# Patient Record
Sex: Male | Born: 1980
Health system: Southern US, Community
[De-identification: ages and names within clinical notes are randomized; demographics above are authoritative.]

## PROBLEM LIST (undated history)

## (undated) DIAGNOSIS — E785 Hyperlipidemia, unspecified: Secondary | ICD-10-CM

## (undated) DIAGNOSIS — J309 Allergic rhinitis, unspecified: Secondary | ICD-10-CM

## (undated) DIAGNOSIS — J45909 Unspecified asthma, uncomplicated: Secondary | ICD-10-CM

## (undated) HISTORY — DX: Allergic rhinitis, unspecified: J30.9

## (undated) HISTORY — DX: Hyperlipidemia, unspecified: E78.5

## (undated) HISTORY — DX: Unspecified asthma, uncomplicated: J45.909

## (undated) HISTORY — PX: HIP SURGERY: SHX245

---

## 2009-09-17 ENCOUNTER — Ambulatory Visit: Payer: Self-pay | Admitting: Internal Medicine

## 2009-09-17 DIAGNOSIS — J45909 Unspecified asthma, uncomplicated: Secondary | ICD-10-CM

## 2009-09-17 DIAGNOSIS — J309 Allergic rhinitis, unspecified: Secondary | ICD-10-CM | POA: Insufficient documentation

## 2009-09-17 DIAGNOSIS — E785 Hyperlipidemia, unspecified: Secondary | ICD-10-CM

## 2009-09-17 DIAGNOSIS — L03119 Cellulitis of unspecified part of limb: Secondary | ICD-10-CM

## 2009-09-17 DIAGNOSIS — L02519 Cutaneous abscess of unspecified hand: Secondary | ICD-10-CM | POA: Insufficient documentation

## 2009-09-17 HISTORY — DX: Allergic rhinitis, unspecified: J30.9

## 2009-09-17 HISTORY — DX: Unspecified asthma, uncomplicated: J45.909

## 2009-09-17 HISTORY — DX: Hyperlipidemia, unspecified: E78.5

## 2010-01-21 ENCOUNTER — Telehealth: Payer: Self-pay | Admitting: Internal Medicine

## 2010-01-21 ENCOUNTER — Ambulatory Visit
Admission: RE | Admit: 2010-01-21 | Discharge: 2010-01-21 | Payer: Self-pay | Source: Home / Self Care | Attending: Internal Medicine | Admitting: Internal Medicine

## 2010-01-21 DIAGNOSIS — J069 Acute upper respiratory infection, unspecified: Secondary | ICD-10-CM | POA: Insufficient documentation

## 2010-02-05 NOTE — Assessment & Plan Note (Signed)
Summary: ?BITTEN BY SOMETHING UNABLE TO MAKE A FIST/NJR   Vital Signs:  Patient profile:   30 year old male Weight:      194 pounds Temp:     99.0 degrees F oral BP sitting:   116 / 80  (left arm) Cuff size:   regular  Vitals Entered By: Duard Brady LPN (September 17, 2009 28:15 PM) CC: new pt - (R) hand swelling - noted saturday night  Is Patient Diabetic? No   CC:  new pt - (R) hand swelling - noted saturday night .  History of Present Illness:  30 year old patient who presents with a 3-day history of increasing pain, swelling, excessive warmth, and minimal erythema involving the dorsum of the right hand, especially over the MCP joints.  This began 3 days ago, while vacationing at [place] [employer].  There's been no trauma.  He has noted a small papule just proximal to the third MCP joint.  No fever, chills, or other constitutional complaints.  He does have a history of allergic rhinitis and has been on chronic antihistamines.  Preventive Screening-Counseling & Management  Alcohol-Tobacco     Smoking Status: never  Allergies (verified): No Known Drug Allergies  Past History:  Past Medical History: Kawasaki's disease  Hyperlipidemia Allergic rhinitis Asthma  Past Surgical History: circumcision 1986 2001.  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 positive for hypercholesterolemia, cardiac disease, and hypertension  Social History: Reviewed history and no changes required. 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    Physical Exam  General:  Well-developed,well-nourished,in no acute distress; alert,appropriate and cooperative throughout examination Skin:  the dorsum of the right and was slightly tender edematous and warm to touch with slight erythema.  There is a small 1 cm papule just  proximal to the right third MCP joint   Impression & Recommendations:  Problem # 1:  CELLULITIS, HAND, RIGHT (ICD-682.4)  His updated medication list for this problem includes:    Cephalexin 500 Mg Caps (Cephalexin) ..... One capsule 3 times daily  Complete Medication List: 1)  Weekly Allergy Injections  2)  Flonase 50 Mcg/act Susp (Fluticasone propionate) .... Qd 3)  Allegra Allergy 180 Mg Tabs (Fexofenadine hcl) .... Qd 4)  Cephalexin 500 Mg Caps (Cephalexin) .... One capsule 3 times daily  Patient Instructions: 1)  Please schedule a follow-up appointment as needed. 2)  Take your antibiotic as prescribed until ALL of it is gone, but stop if you develop a rash or swelling and contact our office as soon as possible. 3)  VIMOVO one twice daily Prescriptions: CEPHALEXIN 500 MG CAPS (CEPHALEXIN) one capsule 3 times daily  #21 x 0   Entered and Authorized by:   Gordy Savers  MD   Signed by:   Gordy Savers  MD on 09/17/2009   Method used:   Electronically to        [facility] [phone]* (retail)       [address]       [place], [place]  [address]       Ph: [phone] or [phone]       Fax: [phone]   RxID:   [id number]

## 2010-02-07 NOTE — Assessment & Plan Note (Signed)
Summary: congestion/cough/njr   Vital Signs:  Patient profile:   30 year old male Weight:      197 pounds Temp:     99.5 degrees F oral BP sitting:   118 / 80  (left arm) Cuff size:   regular  Vitals Entered By: Duard Brady LPN (January 21, 2010 2:50 PM) CC: c/o head congestion and productive cough Is Patient Diabetic? No   CC:  c/o head congestion and productive cough.  History of Present Illness: 5 -year-old patient, who presents with a 4 to 5-day history of primarily chest congestion.  He has a history of allergic rhinitis and asthma.  He has had very little.  A heavy, congestion or postnasal drip.  For the past couple days.  His cough has been a little bit more productive, but still yielding clear sputum.  His been low-grade fever.  He is maintained on weekly allergy injections and takes Flonase, Allegra, chronically.  There is no wheezing  Allergies (verified): No Known Drug Allergies  Past History:  Past Medical History: Reviewed history from 09/17/2009 and no changes required. Kawasaki's disease  Hyperlipidemia Allergic rhinitis Asthma  Review of Systems       The patient complains of anorexia, fever, and prolonged cough.  The patient denies weight loss, weight gain, vision loss, decreased hearing, hoarseness, chest pain, syncope, dyspnea on exertion, peripheral edema, headaches, hemoptysis, abdominal pain, melena, hematochezia, severe indigestion/heartburn, hematuria, incontinence, genital sores, muscle weakness, suspicious skin lesions, transient blindness, difficulty walking, depression, unusual weight change, abnormal bleeding, enlarged lymph nodes, angioedema, breast masses, and testicular masses.    Physical Exam  General:  Well-developed,well-nourished,in no acute distress; alert,appropriate and cooperative throughout examination Head:  Normocephalic and atraumatic without obvious abnormalities. No apparent alopecia or balding. Eyes:  No corneal or  conjunctival inflammation noted. EOMI. Perrla. Funduscopic exam benign, without hemorrhages, exudates or papilledema. Vision grossly normal. Ears:  External ear exam shows no significant lesions or deformities.  Otoscopic examination reveals clear canals, tympanic membranes are intact bilaterally without bulging, retraction, inflammation or discharge. Hearing is grossly normal bilaterally. Nose:  External nasal examination shows no deformity or inflammation. Nasal mucosa are pink and moist without lesions or exudates. Mouth:  Oral mucosa and oropharynx without lesions or exudates.  Teeth in good repair. Neck:  No deformities, masses, or tenderness noted. Lungs:  Normal respiratory effort, chest expands symmetrically. Lungs are clear to auscultation, no crackles or wheezes. Heart:  Normal rate and regular rhythm. S1 and S2 normal without gallop, murmur, click, rub or other extra sounds.   Impression & Recommendations:  Problem # 1:  URI (ICD-465.9)  His updated medication list for this problem includes:    Allegra Allergy 180 Mg Tabs (Fexofenadine hcl) ..... Qd    Hydrocodone-homatropine 5-1.5 Mg/31ml Syrp (Hydrocodone-homatropine) .Marland Kitchen... 1 teaspoon every 6 hours as needed for cough  Problem # 2:  ALLERGIC RHINITIS (ICD-477.9)  His updated medication list for this problem includes:    Flonase 50 Mcg/act Susp (Fluticasone propionate) ..... Qd    Allegra Allergy 180 Mg Tabs (Fexofenadine hcl) ..... Qd  Complete Medication List: 1)  Weekly Allergy Injections  2)  Flonase 50 Mcg/act Susp (Fluticasone propionate) .... Qd 3)  Allegra Allergy 180 Mg Tabs (Fexofenadine hcl) .... Qd 4)  Hydrocodone-homatropine 5-1.5 Mg/41ml Syrp (Hydrocodone-homatropine) .Marland Kitchen.. 1 teaspoon every 6 hours as needed for cough  Patient Instructions: 1)  Get plenty of rest, drink lots of clear liquids, and use Tylenol or Ibuprofen for fever and comfort. Return  in 7-10 days if you're not better:sooner if you're feeling  worse. 2)  Mucinex DM- use   twice daily Prescriptions: HYDROCODONE-HOMATROPINE 5-1.5 MG/5ML SYRP (HYDROCODONE-HOMATROPINE) 1 teaspoon every 6 hours as needed for cough  #6 oz x 0   Entered and Authorized by:   Gordy Savers  MD   Signed by:   Gordy Savers  MD on 01/21/2010   Method used:   Print then Give to Patient   RxID:   [id number]    Orders Added: 1)  Est. Patient Level III [95284]

## 2010-02-07 NOTE — Progress Notes (Signed)
Summary: ?mucinex dm  Phone Note Call from Patient Call back at Home Phone [phone]   Caller: Patient Call For: Gordy Savers  MD Summary of Call: pt was told to pick up mucinex dm twice dail. pt has question should he take one pill in morning and one pill at night? Initial call taken by: Heron Sabins,  January 21, 2010 4:29 PM  Follow-up for Phone Call        Yes, BID Follow-up by: Gordy Savers  MD,  January 22, 2010 8:32 AM  Additional Follow-up for Phone Call Additional follow up Details #1::        attempt to call - ans mach - LMTCB if questions - take two times a day with a full glass of H2O Additional Follow-up by: Duard Brady LPN,  January 22, 2010 11:48 AM

## 2010-04-03 ENCOUNTER — Encounter: Payer: Self-pay | Admitting: Family Medicine

## 2010-04-04 NOTE — Progress Notes (Signed)
This encounter was created in error - please disregard.

## 2010-04-11 ENCOUNTER — Encounter: Payer: Self-pay | Admitting: Family Medicine

## 2010-04-11 ENCOUNTER — Ambulatory Visit (INDEPENDENT_AMBULATORY_CARE_PROVIDER_SITE_OTHER): Payer: BC Managed Care – PPO | Admitting: Family Medicine

## 2010-04-11 VITALS — BP 110/82 | Temp 98.9°F | Ht 73.0 in | Wt 196.0 lb

## 2010-04-11 DIAGNOSIS — R05 Cough: Secondary | ICD-10-CM

## 2010-04-11 MED ORDER — BENZONATATE 200 MG PO CAPS: 200.0000 mg | ORAL_CAPSULE | Freq: Three times a day (TID) | ORAL | Status: AC | PRN

## 2010-04-11 NOTE — Progress Notes (Signed)
  Subjective:    Patient ID: Tony [name], male    DOB: 1980-04-05, 30 y.o.   MRN: [id number]  HPI Patient seen with cough. Approximately 7 day duration. Seeing allergist and placed on multiple medications including Symbicort, saline nasal rinse, Flonase, prednisone taper and Avelox started 2 days ago.  Cough occasionally productive. Especially bothersome at night. He is nonsmoker. Not aware of any wheezing or dyspnea. He had some leftover hydrocodone cough syrup but felt very sedated.   Review of Systems  Constitutional: Negative for fever and chills.  HENT: Positive for congestion.   Respiratory: Positive for cough. Negative for chest tightness, shortness of breath, wheezing and stridor.   Cardiovascular: Negative for chest pain, palpitations and leg swelling.       Objective:   Physical Exam  Constitutional: He appears well-developed and well-nourished.  HENT:  Right Ear: External ear normal.  Left Ear: External ear normal.  Mouth/Throat: Oropharynx is clear and moist. No oropharyngeal exudate.  Eyes: Pupils are equal, round, and reactive to light.  Neck: Neck supple.  Cardiovascular: Normal rate, regular rhythm and normal heart sounds.   Pulmonary/Chest: Effort normal and breath sounds normal. No respiratory distress. He has no wheezes. He has no rales.  Lymphadenopathy:    He has no cervical adenopathy.  Skin: No rash noted.          Assessment & Plan:  Cough. Suspect probable viral bronchitis. Tessalon Perles 200 mg every 8 hours when necessary for cough

## 2010-04-12 ENCOUNTER — Encounter: Payer: Self-pay | Admitting: Family Medicine

## 2010-04-18 ENCOUNTER — Other Ambulatory Visit: Payer: Self-pay | Admitting: Occupational Medicine

## 2010-04-18 ENCOUNTER — Ambulatory Visit
Admission: RE | Admit: 2010-04-18 | Discharge: 2010-04-18 | Disposition: A | Discharge status: Home / Self Care | Payer: No Typology Code available for payment source | Source: Ambulatory Visit | Attending: Occupational Medicine | Admitting: Occupational Medicine

## 2010-04-18 DIAGNOSIS — Z021 Encounter for pre-employment examination: Secondary | ICD-10-CM

## 2010-10-22 ENCOUNTER — Emergency Department (HOSPITAL_COMMUNITY)
Admission: EM | Admit: 2010-10-22 | Discharge: 2010-10-23 | Disposition: A | Discharge status: Home / Self Care | Payer: Worker's Compensation | Attending: Emergency Medicine | Admitting: Emergency Medicine

## 2010-10-22 ENCOUNTER — Emergency Department (HOSPITAL_COMMUNITY): Payer: Worker's Compensation

## 2010-10-22 DIAGNOSIS — M542 Cervicalgia: Secondary | ICD-10-CM | POA: Insufficient documentation

## 2010-10-22 DIAGNOSIS — J45909 Unspecified asthma, uncomplicated: Secondary | ICD-10-CM | POA: Insufficient documentation

## 2010-10-22 DIAGNOSIS — R51 Headache: Secondary | ICD-10-CM | POA: Insufficient documentation

## 2010-10-22 DIAGNOSIS — R112 Nausea with vomiting, unspecified: Secondary | ICD-10-CM | POA: Insufficient documentation

## 2010-10-22 DIAGNOSIS — Y9289 Other specified places as the place of occurrence of the external cause: Secondary | ICD-10-CM | POA: Insufficient documentation

## 2010-10-22 DIAGNOSIS — S060X0A Concussion without loss of consciousness, initial encounter: Secondary | ICD-10-CM | POA: Insufficient documentation

## 2010-10-22 DIAGNOSIS — IMO0002 Reserved for concepts with insufficient information to code with codable children: Secondary | ICD-10-CM | POA: Insufficient documentation

## 2011-09-18 ENCOUNTER — Ambulatory Visit (INDEPENDENT_AMBULATORY_CARE_PROVIDER_SITE_OTHER): Payer: 59 | Admitting: Internal Medicine

## 2011-09-18 DIAGNOSIS — Z0000 Encounter for general adult medical examination without abnormal findings: Secondary | ICD-10-CM

## 2011-09-18 DIAGNOSIS — Z23 Encounter for immunization: Secondary | ICD-10-CM

## 2012-02-05 IMAGING — CR DG CHEST 1V
1 series · 1 of 1 positions shown · non-contrast
Comparison: None

CLINICAL DATA: Pre-employment.

CHEST - 1 VIEW

[view not recorded]
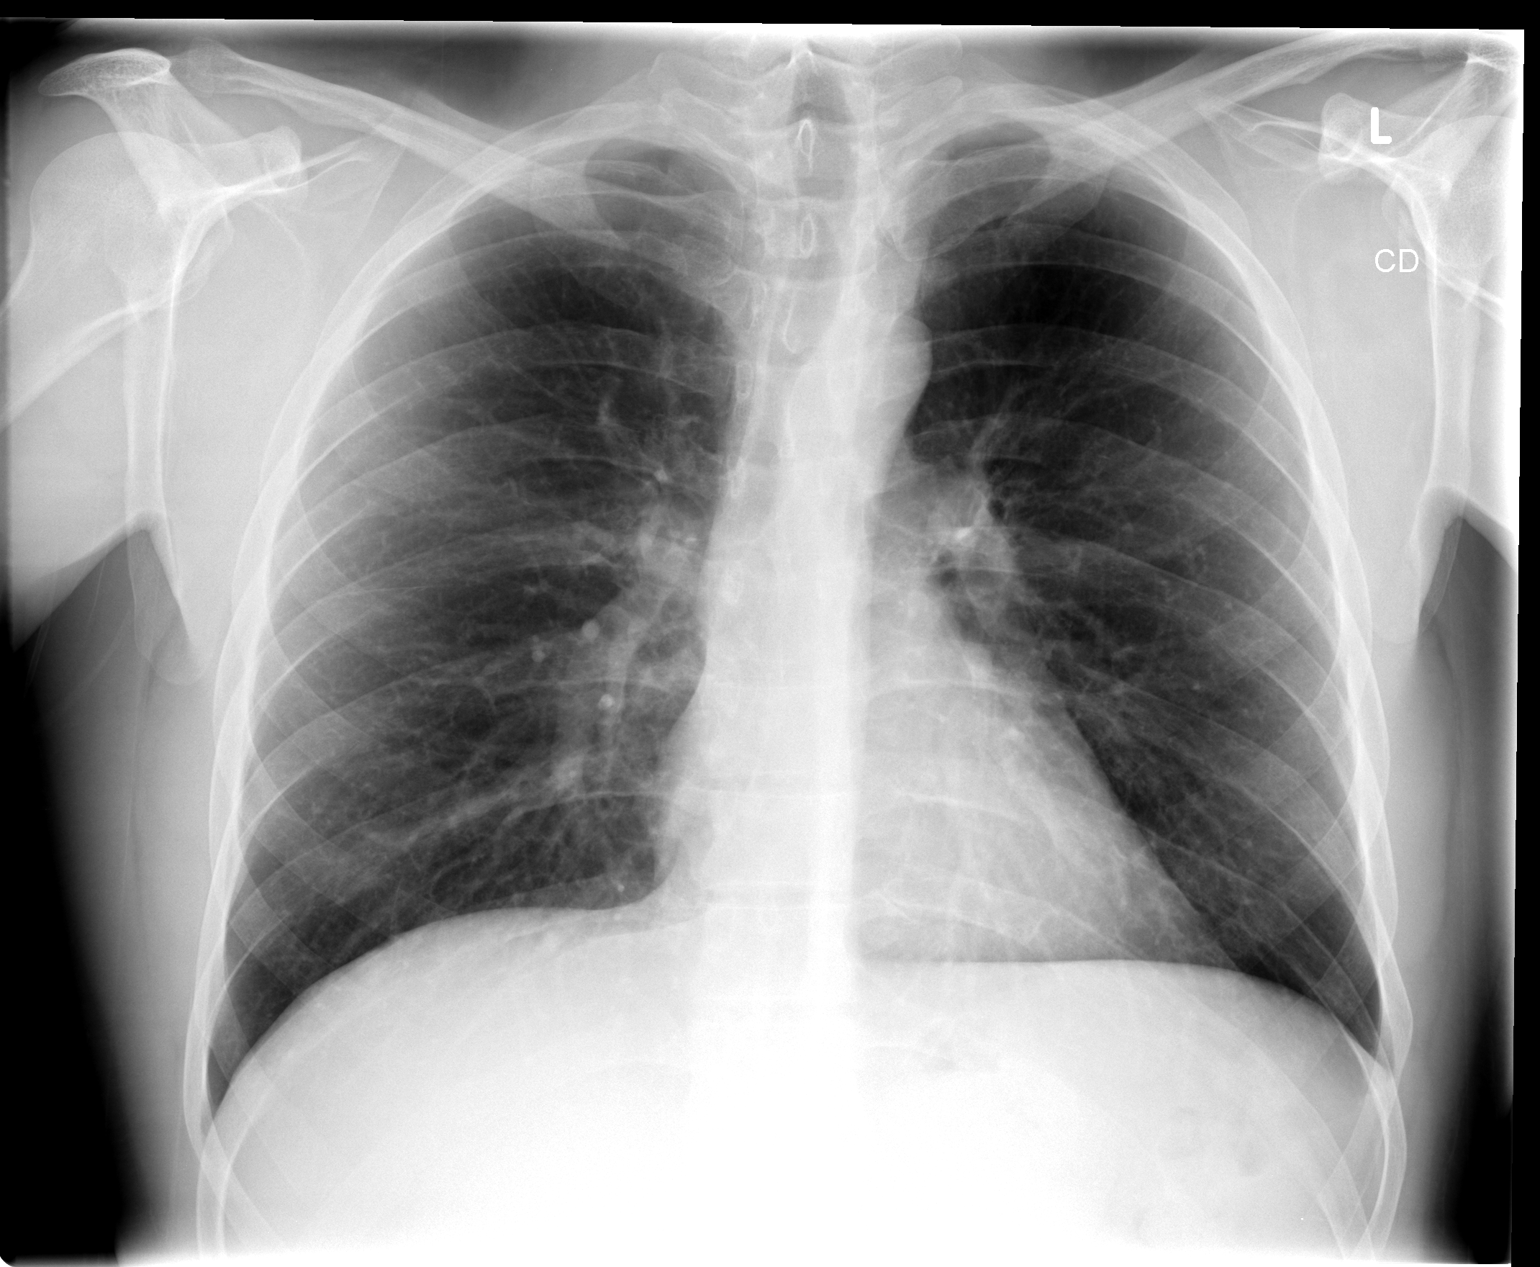

[1 of 1 positions shown; findings below may reference images not displayed]

FINDINGS: Heart and mediastinal contours are within normal limits.
No focal opacities or effusions.  No acute bony abnormality.
IMPRESSION: No active disease.
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
## 2012-11-02 ENCOUNTER — Ambulatory Visit (INDEPENDENT_AMBULATORY_CARE_PROVIDER_SITE_OTHER): Payer: 59 | Admitting: *Deleted

## 2012-11-02 DIAGNOSIS — Z23 Encounter for immunization: Secondary | ICD-10-CM

## 2012-11-03 ENCOUNTER — Telehealth: Payer: Self-pay | Admitting: Internal Medicine

## 2012-11-03 NOTE — Telephone Encounter (Signed)
FYI: Pt called to inform us that his arm hurts very badly after his flu and pneumavax injections on yesterday.  Pt declined triage, states that he has not had any type of injection in 20 years.

## 2012-11-03 NOTE — Telephone Encounter (Signed)
Informed patient that arms could be sore for a couple of days that he could take tylenol and use cold compressions.

## 2012-11-05 ENCOUNTER — Ambulatory Visit (INDEPENDENT_AMBULATORY_CARE_PROVIDER_SITE_OTHER): Payer: 59 | Admitting: Family

## 2012-11-05 ENCOUNTER — Encounter: Payer: Self-pay | Admitting: Family

## 2012-11-05 ENCOUNTER — Ambulatory Visit: Payer: Self-pay | Admitting: Internal Medicine

## 2012-11-05 VITALS — BP 100/70 | HR 89 | Temp 98.6°F | Wt 187.0 lb

## 2012-11-05 DIAGNOSIS — T8062XA Other serum reaction due to vaccination, initial encounter: Secondary | ICD-10-CM

## 2012-11-05 DIAGNOSIS — T881XXA Other complications following immunization, not elsewhere classified, initial encounter: Secondary | ICD-10-CM

## 2012-11-05 DIAGNOSIS — M79601 Pain in right arm: Secondary | ICD-10-CM

## 2012-11-05 DIAGNOSIS — M79609 Pain in unspecified limb: Secondary | ICD-10-CM

## 2012-11-05 MED ORDER — METHYLPREDNISOLONE ACETATE 40 MG/ML IJ SUSP
80.0000 mg | Freq: Once | INTRAMUSCULAR | Status: AC
  Administered 2012-11-05: 80 mg via INTRAMUSCULAR

## 2012-11-05 NOTE — Progress Notes (Signed)
  Subjective:    Patient ID: Tony [name], male    DOB: 08/30/1980, 32 y.o.   MRN: [id number]  HPI 32 year old white male, nonsmoker, patient of Dr. Joyce Gross is in today with a localized reaction to a pneumonia injection he had done 2 days ago. Report had been redness, swelling, fever of 100.1 that has gradually improved. He also received a flu vaccine in his left arm. No reaction to that site locally. Patient has a history of allergic rhinitis and is currently under the care of an allergist. Allergies suggested he has pneumonia vaccine. He is due to undergo sinus surgery on 11/16/2012.   Review of Systems  Constitutional: Positive for fever. Negative for fatigue.  HENT: Positive for congestion, rhinorrhea and sinus pressure.   Respiratory: Negative.   Cardiovascular: Negative.   Genitourinary: Negative.   Musculoskeletal: Negative.   Skin: Positive for wound.       red, swollen right upper arm  Neurological: Negative.   Psychiatric/Behavioral: Negative.    Past Medical History  Diagnosis Date  . HYPERLIPIDEMIA 09/17/2009  . ALLERGIC RHINITIS 09/17/2009  . ASTHMA 09/17/2009    History   Social History  . Marital Status: Married    Spouse Name: N/A    Number of Children: N/A  . Years of Education: N/A   Occupational History  . Not on file.   Social History Main Topics  . Smoking status: Never Smoker   . Smokeless tobacco: Not on file  . Alcohol Use: Not on file  . Drug Use: Not on file  . Sexual Activity: Not on file   Other Topics Concern  . Not on file   Social History Narrative  . No narrative on file    History reviewed. No pertinent past surgical history.  No family history on file.  No Known Allergies  Current Outpatient Prescriptions on File Prior to Visit  Medication Sig Dispense Refill  . cetirizine (ZYRTEC) 10 MG tablet Take 10 mg by mouth daily.        . fluticasone (FLONASE) 50 MCG/ACT nasal spray 2 sprays by Nasal route daily.        . Olopatadine  HCl (PATANASE) 0.6 % SOLN by Nasal route. 2 sprays to each nostril daily        No current facility-administered medications on file prior to visit.    BP 100/70  Pulse 89  Temp(Src) 98.6 F (37 C) (Oral)  Wt 187 lb (84.823 kg)  BMI 24.68 kg/m2chart    Objective:   Physical Exam  Constitutional: He is oriented to person, place, and time. He appears well-developed and well-nourished.  Neck: Normal range of motion. Neck supple.  Cardiovascular: Normal rate, regular rhythm and normal heart sounds.   Pulmonary/Chest: Effort normal and breath sounds normal.  Musculoskeletal: Normal range of motion.  Neurological: He is alert and oriented to person, place, and time.  Skin: There is erythema.  Right upper arm: Red, mild swelling, tenderness palpation.  Psychiatric: He has a normal mood and affect.          Assessment & Plan:  Assessment: 1. Localized reaction to immunization 2. Right arm pain 3. Allergic rhinitis  Plan: Depo-Medrol 80 mg IM x1. Ice to the affected area. Patient to call with any questions or concerns. Recheck as scheduled, and as needed.

## 2012-11-06 HISTORY — PX: SINUS EXPLORATION: SHX5214

## 2012-12-13 ENCOUNTER — Ambulatory Visit (INDEPENDENT_AMBULATORY_CARE_PROVIDER_SITE_OTHER): Payer: 59 | Admitting: Internal Medicine

## 2012-12-13 ENCOUNTER — Encounter: Payer: Self-pay | Admitting: Internal Medicine

## 2012-12-13 VITALS — BP 120/76 | HR 75 | Temp 98.1°F | Resp 20 | Ht 73.5 in | Wt 191.0 lb

## 2012-12-13 DIAGNOSIS — Z0000 Encounter for general adult medical examination without abnormal findings: Secondary | ICD-10-CM

## 2012-12-13 DIAGNOSIS — E785 Hyperlipidemia, unspecified: Secondary | ICD-10-CM

## 2012-12-13 LAB — COMPREHENSIVE METABOLIC PANEL
Albumin: 4.3 g/dL (ref 3.5–5.2)
Alkaline Phosphatase: 74 U/L (ref 39–117)
BUN: 11 mg/dL (ref 6–23)
CO2: 30 mEq/L (ref 19–32)
Calcium: 9.4 mg/dL (ref 8.4–10.5)
Chloride: 103 mEq/L (ref 96–112)
Glucose, Bld: 88 mg/dL (ref 70–99)
Potassium: 4.8 mEq/L (ref 3.5–5.1)
Sodium: 139 mEq/L (ref 135–145)
Total Protein: 7.1 g/dL (ref 6.0–8.3)

## 2012-12-13 NOTE — Progress Notes (Signed)
Pre-visit discussion using our clinic review tool. No additional management support is needed unless otherwise documented below in the visit note.  

## 2012-12-13 NOTE — Progress Notes (Signed)
   Subjective:    Patient ID: Tony [name], male    DOB: Sep 23, 1980, 31 y.o.   MRN: [id number]  HPI     Review of Systems     Objective:   Physical Exam        Assessment & Plan:

## 2012-12-13 NOTE — Patient Instructions (Signed)
It is important that you exercise regularly, at least 20 minutes 3 to 4 times per week.  If you develop chest pain or shortness of breath seek  medical attention.  Health Maintenance, Males A healthy lifestyle and preventative care can promote health and wellness.  Maintain regular health, dental, and eye exams.  Eat a healthy diet. Foods like vegetables, fruits, whole grains, low-fat dairy products, and lean protein foods contain the nutrients you need without too many calories. Decrease your intake of foods high in solid fats, added sugars, and salt. Get information about a proper diet from your caregiver, if necessary.  Regular physical exercise is one of the most important things you can do for your health. Most adults should get at least 150 minutes of moderate-intensity exercise (any activity that increases your heart rate and causes you to sweat) each week. In addition, most adults need muscle-strengthening exercises on 2 or more days a week.   Maintain a healthy weight. The body mass index (BMI) is a screening tool to identify possible weight problems. It provides an estimate of body fat based on height and weight. Your caregiver can help determine your BMI, and can help you achieve or maintain a healthy weight. For adults 20 years and older:  A BMI below 18.5 is considered underweight.  A BMI of 18.5 to 24.9 is normal.  A BMI of 25 to 29.9 is considered overweight.  A BMI of 30 and above is considered obese.  Maintain normal blood lipids and cholesterol by exercising and minimizing your intake of saturated fat. Eat a balanced diet with plenty of fruits and vegetables. Blood tests for lipids and cholesterol should begin at age 40 and be repeated every 5 years. If your lipid or cholesterol levels are high, you are over 50, or you are a high risk for heart disease, you may need your cholesterol levels checked more frequently.Ongoing high lipid and cholesterol levels should be treated  with medicines, if diet and exercise are not effective.  If you smoke, find out from your caregiver how to quit. If you do not use tobacco, do not start.  Lung cancer screening is recommended for adults aged 47 80 years who are at high risk for developing lung cancer because of a history of smoking. Yearly low-dose computed tomography (CT) is recommended for people who have at least a 30-pack-year history of smoking and are a current smoker or have quit within the past 15 years. A pack year of smoking is smoking an average of 1 pack of cigarettes a day for 1 year (for example: 1 pack a day for 30 years or 2 packs a day for 15 years). Yearly screening should continue until the smoker has stopped smoking for at least 15 years. Yearly screening should also be stopped for people who develop a health problem that would prevent them from having lung cancer treatment.  If you choose to drink alcohol, do not exceed 2 drinks per day. One drink is considered to be 12 ounces (355 mL) of beer, 5 ounces (148 mL) of wine, or 1.5 ounces (44 mL) of liquor.  Avoid use of street drugs. Do not share needles with anyone. Ask for help if you need support or instructions about stopping the use of drugs.  High blood pressure causes heart disease and increases the risk of stroke. Blood pressure should be checked at least every 1 to 2 years. Ongoing high blood pressure should be treated with medicines if weight loss and  exercise are not effective.  If you are 38 to 30 years old, ask your caregiver if you should take aspirin to prevent heart disease.  Diabetes screening involves taking a blood sample to check your fasting blood sugar level. This should be done once every 3 years, after age 36, if you are within normal weight and without risk factors for diabetes. Testing should be considered at a younger age or be carried out more frequently if you are overweight and have at least 1 risk factor for diabetes.  Colorectal  cancer can be detected and often prevented. Most routine colorectal cancer screening begins at the age of 80 and continues through age 77. However, your caregiver may recommend screening at an earlier age if you have risk factors for colon cancer. On a yearly basis, your caregiver may provide home test kits to check for hidden blood in the stool. Use of a small camera at the end of a tube, to directly examine the colon (sigmoidoscopy or colonoscopy), can detect the earliest forms of colorectal cancer. Talk to your caregiver about this at age 66, when routine screening begins. Direct examination of the colon should be repeated every 5 to 10 years through age 66, unless early forms of pre-cancerous polyps or small growths are found.  Hepatitis C blood testing is recommended for all people born from [birth year] through 1963 and any individual with known risks for hepatitis C.  Healthy men should no longer receive prostate-specific antigen (PSA) blood tests as part of routine cancer screening. Consult with your caregiver about prostate cancer screening.  Testicular cancer screening is not recommended for adolescents or adult males who have no symptoms. Screening includes self-exam, caregiver exam, and other screening tests. Consult with your caregiver about any symptoms you have or any concerns you have about testicular cancer.  Practice safe sex. Use condoms and avoid high-risk sexual practices to reduce the spread of sexually transmitted infections (STIs).  Use sunscreen. Apply sunscreen liberally and repeatedly throughout the day. You should seek shade when your shadow is shorter than you. Protect yourself by wearing long sleeves, pants, a wide-brimmed hat, and sunglasses year round, whenever you are outdoors.  Notify your caregiver of new moles or changes in moles, especially if there is a change in shape or color. Also notify your caregiver if a mole is larger than the size of a pencil eraser.  A one-time  screening for abdominal aortic aneurysm (AAA) and surgical repair of large AAAs by sound wave imaging (ultrasonography) is recommended for ages 24 to 20 years who are current or former smokers.  Stay current with your immunizations. Document Released: 06/21/2007 Document Revised: 04/19/2012 Document Reviewed: 05/20/2010 [facility] Patient Information 2014 [place], [place].

## 2012-12-13 NOTE — Progress Notes (Signed)
Subjective:    Patient ID: Tony [name], male    DOB: [birth year], 32 y.o.   MRN: [id number]  HPI  32 year old patient who is seen today for a health maintenance exam. He has enjoyed excellent health. No concerns or complaints today. History of Kawasaki's disease at age 49 months. He did see cardiology about 4 years ago and apparently had a 2-D echocardiogram at that time. No history of coronary artery aneurysms. He does have a history of mild dyslipidemia. Family history is positive for premature coronary artery disease with his father  Preventive Screening-Counseling & Management  Alcohol-Tobacco  Smoking Status: never   Allergies (verified):  No Known Drug Allergies   Past History:  Past Medical History:  Kawasaki's disease  Hyperlipidemia  Allergic rhinitis  Asthma   Past Surgical History:  circumcision 1986  2001. Benign tumor resected from dorsum right hand  1990 groin hernia repair  Sinus surgery November 2014 Jenne Pane)  Family History:  Father age 53 history of coronary artery disease and multiple stents at age 80 history depression Mother age 36 in good health One brother in good health  positive for hypercholesterolemia, cardiac disease, and hypertension   Social History:  Reviewed history and no changes required.  Married  Former Education officer, museum; now  employed with the police department  Smoking Status: never    Review of Systems  Constitutional: Negative for fever, chills, activity change, appetite change and fatigue.  HENT: Negative for congestion, dental problem, ear pain, hearing loss, mouth sores, rhinorrhea, sinus pressure, sneezing, tinnitus, trouble swallowing and voice change.   Eyes: Negative for photophobia, pain, redness and visual disturbance.  Respiratory: Negative for apnea, cough, choking, chest tightness, shortness of breath and wheezing.   Cardiovascular: Negative for chest pain, palpitations and leg swelling.  Gastrointestinal:  Negative for nausea, vomiting, abdominal pain, diarrhea, constipation, blood in stool, abdominal distention, anal bleeding and rectal pain.  Genitourinary: Negative for dysuria, urgency, frequency, hematuria, flank pain, decreased urine volume, discharge, penile swelling, scrotal swelling, difficulty urinating, genital sores and testicular pain.  Musculoskeletal: Negative for arthralgias, back pain, gait problem, joint swelling, myalgias, neck pain and neck stiffness.  Skin: Negative for color change, rash and wound.  Neurological: Negative for dizziness, tremors, seizures, syncope, facial asymmetry, speech difficulty, weakness, light-headedness, numbness and headaches.  Hematological: Negative for adenopathy. Does not bruise/bleed easily.  Psychiatric/Behavioral: Negative for suicidal ideas, hallucinations, behavioral problems, confusion, sleep disturbance, self-injury, dysphoric mood, decreased concentration and agitation. The patient is not nervous/anxious.        Objective:   Physical Exam  Constitutional: He appears well-developed and well-nourished.  HENT:  Head: Normocephalic and atraumatic.  Right Ear: External ear normal.  Left Ear: External ear normal.  Nose: Nose normal.  Mouth/Throat: Oropharynx is clear and moist.  Eyes: Conjunctivae and EOM are normal. Pupils are equal, round, and reactive to light. No scleral icterus.  Neck: Normal range of motion. Neck supple. No JVD present. No thyromegaly present.  Cardiovascular: Regular rhythm, normal heart sounds and intact distal pulses.  Exam reveals no gallop and no friction rub.   No murmur heard. Pulmonary/Chest: Effort normal and breath sounds normal. He exhibits no tenderness.  Abdominal: Soft. Bowel sounds are normal. He exhibits no distension and no mass. There is no tenderness.  Genitourinary: Penis normal.  Musculoskeletal: Normal range of motion. He exhibits no edema and no tenderness.  Lymphadenopathy:    He has no  cervical adenopathy.  Neurological: He is alert. He has normal  reflexes. No cranial nerve deficit. Coordination normal.  Skin: Skin is warm and dry. No rash noted.  Psychiatric: He has a normal mood and affect. His behavior is normal.          Assessment & Plan:   Preventive health examination History mild dyslipidemia. Will review a lipid profile History of Kawasaki's disease Allergic rhinitis

## 2012-12-14 LAB — LDL CHOLESTEROL, DIRECT: Direct LDL: 153.9 mg/dL

## 2013-03-02 ENCOUNTER — Encounter: Payer: Self-pay | Admitting: Family Medicine

## 2013-03-02 ENCOUNTER — Ambulatory Visit (INDEPENDENT_AMBULATORY_CARE_PROVIDER_SITE_OTHER): Payer: 59 | Admitting: Family Medicine

## 2013-03-02 VITALS — BP 110/72 | HR 71 | Temp 98.6°F | Ht 73.5 in | Wt 185.0 lb

## 2013-03-02 DIAGNOSIS — J019 Acute sinusitis, unspecified: Secondary | ICD-10-CM

## 2013-03-02 MED ORDER — AMOXICILLIN-POT CLAVULANATE 875-125 MG PO TABS: 1.0000 | ORAL_TABLET | Freq: Two times a day (BID) | ORAL | Status: DC

## 2013-03-02 NOTE — Progress Notes (Signed)
   Subjective:    Patient ID: Creed Coppericholas Ludolph, male    DOB: 06/01/1980, 33 y.o.   MRN: [id number]  HPI Here for 5 days of sinus pressure, HA, PND, and a dry cough. On Allegra D.    Review of Systems  Constitutional: Negative.   HENT: Positive for congestion, postnasal drip and sinus pressure.   Eyes: Negative.   Respiratory: Positive for cough.        Objective:   Physical Exam  Constitutional: He appears well-developed and well-nourished.  HENT:  Right Ear: External ear normal.  Left Ear: External ear normal.  Nose: Nose normal.  Mouth/Throat: Oropharynx is clear and moist.  Eyes: Conjunctivae are normal.  Pulmonary/Chest: Effort normal and breath sounds normal.  Lymphadenopathy:    He has no cervical adenopathy.          Assessment & Plan:  Recheck prn

## 2013-03-02 NOTE — Progress Notes (Signed)
Pre visit review using our clinic review tool, if applicable. No additional management support is needed unless otherwise documented below in the visit note. 

## 2013-06-14 ENCOUNTER — Emergency Department (HOSPITAL_COMMUNITY)
Admission: EM | Admit: 2013-06-14 | Discharge: 2013-06-14 | Disposition: A | Discharge status: Home / Self Care | Payer: 59 | Attending: Emergency Medicine | Admitting: Emergency Medicine

## 2013-06-14 ENCOUNTER — Encounter (HOSPITAL_COMMUNITY): Payer: Self-pay | Admitting: Emergency Medicine

## 2013-06-14 DIAGNOSIS — I10 Essential (primary) hypertension: Secondary | ICD-10-CM | POA: Insufficient documentation

## 2013-06-14 DIAGNOSIS — Z79899 Other long term (current) drug therapy: Secondary | ICD-10-CM | POA: Insufficient documentation

## 2013-06-14 DIAGNOSIS — J45901 Unspecified asthma with (acute) exacerbation: Secondary | ICD-10-CM | POA: Insufficient documentation

## 2013-06-14 DIAGNOSIS — Z464 Encounter for fitting and adjustment of orthodontic device: Secondary | ICD-10-CM | POA: Insufficient documentation

## 2013-06-14 DIAGNOSIS — E785 Hyperlipidemia, unspecified: Secondary | ICD-10-CM | POA: Insufficient documentation

## 2013-06-14 DIAGNOSIS — M549 Dorsalgia, unspecified: Secondary | ICD-10-CM | POA: Insufficient documentation

## 2013-06-14 DIAGNOSIS — IMO0002 Reserved for concepts with insufficient information to code with codable children: Secondary | ICD-10-CM | POA: Insufficient documentation

## 2013-06-14 DIAGNOSIS — M7989 Other specified soft tissue disorders: Secondary | ICD-10-CM | POA: Insufficient documentation

## 2013-06-14 DIAGNOSIS — T7840XA Allergy, unspecified, initial encounter: Secondary | ICD-10-CM

## 2013-06-14 MED ORDER — DEXAMETHASONE 6 MG PO TABS
6.0000 mg | ORAL_TABLET | Freq: Once | ORAL | Status: AC
  Administered 2013-06-14: 6 mg via ORAL
  Filled 2013-06-14: qty 1

## 2013-06-14 MED ORDER — ALBUTEROL SULFATE HFA 108 (90 BASE) MCG/ACT IN AERS
2.0000 | INHALATION_SPRAY | RESPIRATORY_TRACT | Status: DC | PRN
  Administered 2013-06-14: 2 via RESPIRATORY_TRACT
  Filled 2013-06-14: qty 6.7

## 2013-06-14 NOTE — Discharge Instructions (Signed)

## 2013-06-14 NOTE — ED Provider Notes (Signed)
CSN: [id number]     Arrival date & time 06/14/13  1659 History   First MD Initiated Contact with Patient 06/14/13 1729     Chief Complaint  Patient presents with  . Shortness of Breath  . Asthma     (Consider location/radiation/quality/duration/timing/severity/associated sxs/prior Treatment) Patient is a 33 y.o. male presenting with shortness of breath and asthma. The history is provided by the patient.  Shortness of Breath Associated symptoms: cough and wheezing   Associated symptoms: no abdominal pain, no chest pain, no headaches, no rash and no vomiting   Asthma Associated symptoms include shortness of breath. Pertinent negatives include no chest pain, no abdominal pain and no headaches.   patient has been having allergy shots for years. He states over the last 2 weeks he started to have reactions. He states 2   weeks ago he developed a large swelling at the site and one week ago with a smaller dose he had another swelling, but not as large. He states today shortly after the injection, around 2 hours prior to arrival he began to have another swelling at the site. He states that he developed a cough and some trouble breathing. He states he has a history of asthma but has not had a flare a long time. He states he is allergic to "anything grows". He states he is very well controlled and is even able to mow his lawn. He also has had some back pain, but increase it to the coughing. Past Medical History  Diagnosis Date  . HYPERLIPIDEMIA 09/17/2009  . ALLERGIC RHINITIS 09/17/2009  . ASTHMA 09/17/2009   Past Surgical History  Procedure Laterality Date  . Sinus exploration  Nov 2014     per Dr. Jenne Pane    No family history on file. History  Substance Use Topics  . Smoking status: Never Smoker   . Smokeless tobacco: Never Used  . Alcohol Use: Yes     Comment: occ    Review of Systems  Constitutional: Negative for activity change and appetite change.  Eyes: Negative for pain.   Respiratory: Positive for cough, shortness of breath and wheezing. Negative for chest tightness.   Cardiovascular: Negative for chest pain and leg swelling.  Gastrointestinal: Negative for nausea, vomiting, abdominal pain and diarrhea.  Genitourinary: Negative for flank pain.  Musculoskeletal: Positive for back pain. Negative for neck stiffness.  Skin: Negative for rash.  Neurological: Negative for weakness, numbness and headaches.  Psychiatric/Behavioral: Negative for behavioral problems.      Allergies  Review of patient's allergies indicates no known allergies.  Home Medications   Prior to Admission medications   Medication Sig Start Date End Date Taking? Authorizing Provider  fexofenadine (ALLEGRA) 180 MG tablet Take 180 mg by mouth daily.   Yes Historical Provider, MD  fluticasone (FLONASE) 50 MCG/ACT nasal spray 2 sprays by Nasal route daily.     Yes Historical Provider, MD  Olopatadine HCl (PATANASE) 0.6 % SOLN by Nasal route. 2 sprays to each nostril daily    Yes Historical Provider, MD   BP 150/60  Pulse 88  Temp(Src) 98 F (36.7 C) (Oral)  Resp 19  SpO2 98% Physical Exam  Nursing note and vitals reviewed. Constitutional: He appears well-developed and well-nourished.  HENT:  Head: Normocephalic and atraumatic.  Mouth/Throat: No oropharyngeal exudate.  No posterior pharyngeal edema  Eyes: EOM are normal. Pupils are equal, round, and reactive to light.  Neck: Normal range of motion.  Cardiovascular: Normal rate, regular rhythm and normal  heart sounds.   No murmur heard. Pulmonary/Chest: Effort normal.  Mildly harsh breath sounds without overt wheezes  Abdominal: Soft. Bowel sounds are normal. He exhibits no distension and no mass. There is no tenderness. There is no rebound and no guarding.  Musculoskeletal: Normal range of motion. He exhibits tenderness. He exhibits no edema.  Swelling in her right deltoid somewhat posteriorly. Approximately 3 x 4 cm. No  erythema. No fluctuance  Neurological: He is alert.  Skin: Skin is warm and dry. No rash noted. No erythema.  Psychiatric: He has a normal mood and affect.    ED Course  Procedures (including critical care time) Labs Review Labs Reviewed - No data to display  Imaging Review No results found.   EKG Interpretation None      MDM   Final diagnoses:  Allergic reaction to drug    Patient with swelling in his arm at site of injection and cough with shortness of breath. Has a history of allergies. His been worsening with injections of the last 2 weeks. He feels much better after he learned steroids. He'll be discharged home follow with his allergist    Juliet Rudeathan R. Rubin PayorPickering, MD 06/14/13 1928

## 2013-06-14 NOTE — ED Notes (Signed)
Pt c/o SOB and lightheadedness. Pt states he got allergy shot at 1515 and states after that he began to become SOB and states the site where allergy shot is has a knot in it. Pt has dry cough and states he is beginning to get pain in lower back.

## 2013-06-15 ENCOUNTER — Telehealth: Payer: Self-pay | Admitting: Internal Medicine

## 2013-06-15 MED ORDER — SCOPOLAMINE 1 MG/3DAYS TD PT72: 1.0000 | MEDICATED_PATCH | TRANSDERMAL | Status: DC

## 2013-06-15 NOTE — Telephone Encounter (Signed)
Please advise 

## 2013-06-15 NOTE — Telephone Encounter (Signed)
Pt is going on 4 day cruise  and would like seasickness patches call into [facility] [address]. Pt is leaving on 06-29-13

## 2013-06-15 NOTE — Telephone Encounter (Signed)
ok 

## 2013-06-15 NOTE — Telephone Encounter (Signed)
Rx sent to pharmacy   

## 2014-01-24 ENCOUNTER — Ambulatory Visit: Payer: Self-pay | Admitting: *Deleted

## 2014-01-25 ENCOUNTER — Ambulatory Visit (INDEPENDENT_AMBULATORY_CARE_PROVIDER_SITE_OTHER): Payer: 59 | Admitting: *Deleted

## 2014-01-25 DIAGNOSIS — Z23 Encounter for immunization: Secondary | ICD-10-CM

## 2014-02-27 ENCOUNTER — Other Ambulatory Visit (INDEPENDENT_AMBULATORY_CARE_PROVIDER_SITE_OTHER): Payer: 59

## 2014-02-27 DIAGNOSIS — Z0000 Encounter for general adult medical examination without abnormal findings: Secondary | ICD-10-CM

## 2014-02-27 LAB — COMPREHENSIVE METABOLIC PANEL
ALK PHOS: 68 U/L (ref 39–117)
ALT: 18 U/L (ref 0–53)
AST: 16 U/L (ref 0–37)
Albumin: 3.9 g/dL (ref 3.5–5.2)
BUN: 12 mg/dL (ref 6–23)
CALCIUM: 9.2 mg/dL (ref 8.4–10.5)
CHLORIDE: 104 meq/L (ref 96–112)
CO2: 27 meq/L (ref 19–32)
Creatinine, Ser: 1.02 mg/dL (ref 0.40–1.50)
GFR: 90.17 mL/min (ref >60.00)
Glucose, Bld: 79 mg/dL (ref 70–99)
Potassium: 4.2 mEq/L (ref 3.5–5.1)
Sodium: 140 mEq/L (ref 135–145)
TOTAL PROTEIN: 6.5 g/dL (ref 6.0–8.3)
Total Bilirubin: 0.4 mg/dL (ref 0.2–1.2)

## 2014-02-27 LAB — LIPID PANEL
CHOL/HDL RATIO: 5
CHOLESTEROL: 187 mg/dL (ref 0–200)
HDL: 40.4 mg/dL (ref >39.00)
LDL CALC: 113 mg/dL — AB (ref 0–99)
NonHDL: 146.6
TRIGLYCERIDES: 170 mg/dL — AB (ref 0.0–149.0)
VLDL: 34 mg/dL (ref 0.0–40.0)

## 2014-02-27 LAB — CBC WITH DIFFERENTIAL/PLATELET
Basophils Absolute: 0 10*3/uL (ref 0.0–0.1)
Basophils Relative: 0.6 % (ref 0.0–3.0)
Eosinophils Absolute: 0.2 10*3/uL (ref 0.0–0.7)
Eosinophils Relative: 4.8 % (ref 0.0–5.0)
HCT: 45.9 % (ref 39.0–52.0)
Hemoglobin: 15.6 g/dL (ref 13.0–17.0)
LYMPHS PCT: 42.3 % (ref 12.0–46.0)
Lymphs Abs: 2.1 10*3/uL (ref 0.7–4.0)
MCHC: 34 g/dL (ref 30.0–36.0)
MCV: 87.9 fl (ref 78.0–100.0)
Monocytes Absolute: 0.4 10*3/uL (ref 0.1–1.0)
Monocytes Relative: 8.7 % (ref 3.0–12.0)
NEUTROS ABS: 2.2 10*3/uL (ref 1.4–7.7)
Neutrophils Relative %: 43.6 % (ref 43.0–77.0)
Platelets: 157 10*3/uL (ref 150.0–400.0)
RBC: 5.22 Mil/uL (ref 4.22–5.81)
RDW: 13.2 % (ref 11.5–15.5)
WBC: 5.1 10*3/uL (ref 4.0–10.5)

## 2014-02-27 LAB — TSH: TSH: 1.48 u[IU]/mL (ref 0.35–4.50)

## 2014-03-08 ENCOUNTER — Encounter: Payer: Self-pay | Admitting: Internal Medicine

## 2014-03-08 ENCOUNTER — Ambulatory Visit (INDEPENDENT_AMBULATORY_CARE_PROVIDER_SITE_OTHER): Payer: 59 | Admitting: Internal Medicine

## 2014-03-08 VITALS — BP 120/80 | HR 69 | Temp 98.0°F | Resp 20 | Ht 73.5 in | Wt 196.0 lb

## 2014-03-08 DIAGNOSIS — Z0000 Encounter for general adult medical examination without abnormal findings: Secondary | ICD-10-CM

## 2014-03-08 LAB — POCT URINALYSIS DIPSTICK
BILIRUBIN UA: NEGATIVE
GLUCOSE UA: NEGATIVE
Ketones, UA: NEGATIVE
LEUKOCYTES UA: NEGATIVE
NITRITE UA: NEGATIVE
Protein, UA: NEGATIVE
RBC UA: NEGATIVE
Spec Grav, UA: 1.01
UROBILINOGEN UA: 0.2
pH, UA: 7

## 2014-03-08 MED ORDER — TRAZODONE HCL 50 MG PO TABS: 25.0000 mg | ORAL_TABLET | Freq: Every evening | ORAL | Status: DC | PRN

## 2014-03-08 NOTE — Progress Notes (Signed)
Subjective:    Patient ID: Tony [name], male    DOB: 01/29/1980, 34 y.o.   MRN: [id number]  HPI  34 year old patient who is seen today for a health maintenance examination.  He has a history of allergic rhinitis and asthma.  He has required immunotherapy in the past.  Doing quite well today. 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  Past Medical History  Diagnosis Date  . HYPERLIPIDEMIA 09/17/2009  . ALLERGIC RHINITIS 09/17/2009  . ASTHMA 09/17/2009    History   Social History  . Marital Status: Married    Spouse Name: N/A  . Number of Children: N/A  . Years of Education: N/A   Occupational History  . Not on file.   Social History Main Topics  . Smoking status: Never Smoker   . Smokeless tobacco: Never Used  . Alcohol Use: Yes     Comment: occ  . Drug Use: No  . Sexual Activity: Not on file   Other Topics Concern  . Not on file   Social History Narrative    Past Surgical History  Procedure Laterality Date  . Sinus exploration  Nov 2014     per Dr. Jenne PaneBates     No family history on file.  No Known Allergies  Current Outpatient Prescriptions on File Prior to Visit  Medication Sig Dispense Refill  . fexofenadine (ALLEGRA) 180 MG tablet Take 180 mg by mouth daily.    . fluticasone (FLONASE) 50 MCG/ACT nasal spray 2 sprays by Nasal route daily.      . Olopatadine HCl (PATANASE) 0.6 % SOLN by Nasal route. 2 sprays to each nostril daily      No current facility-administered medications on file prior to visit.    BP 120/80 mmHg  Pulse 69  Temp(Src) 98 F (36.7 C) (Oral)  Resp 20  Ht 6' 1.5" (1.867 m)  Wt 196 lb (88.905 kg)  BMI 25.51 kg/m2  SpO2 98%     Review of Systems  Constitutional: Negative for fever, chills, appetite change and fatigue.  HENT: Negative for congestion, dental problem, ear pain, hearing loss, sore throat, tinnitus, trouble swallowing and voice change.   Eyes: Negative for pain, discharge  and visual disturbance.  Respiratory: Negative for cough, chest tightness, wheezing and stridor.   Cardiovascular: Negative for chest pain, palpitations and leg swelling.  Gastrointestinal: Negative for nausea, vomiting, abdominal pain, diarrhea, constipation, blood in stool and abdominal distention.  Genitourinary: Negative for urgency, hematuria, flank pain, discharge, difficulty urinating and genital sores.  Musculoskeletal: Negative for myalgias, back pain, joint swelling, arthralgias, gait problem and neck stiffness.  Skin: Negative for rash.  Neurological: Negative for dizziness, syncope, speech difficulty, weakness, numbness and headaches.  Hematological: Negative for adenopathy. Does not bruise/bleed easily.  Psychiatric/Behavioral: Positive for sleep disturbance. Negative for behavioral problems and dysphoric mood. The patient is not nervous/anxious.        Objective:   Physical Exam  Constitutional: He appears well-developed and well-nourished.  HENT:  Head: Normocephalic and atraumatic.  Right Ear: External ear normal.  Left Ear: External ear normal.  Nose: Nose normal.  Mouth/Throat: Oropharynx is clear and moist.  Eyes: Conjunctivae and EOM are normal. Pupils are equal, round, and reactive to light. No scleral icterus.  Neck: Normal range of motion. Neck supple. No JVD present. No thyromegaly present.  Cardiovascular: Regular rhythm, normal heart sounds and intact distal pulses.  Exam reveals no gallop and no friction  rub.   No murmur heard. Pulmonary/Chest: Effort normal and breath sounds normal. He exhibits no tenderness.  Abdominal: Soft. Bowel sounds are normal. He exhibits no distension and no mass. There is no tenderness.  Genitourinary: Prostate normal and penis normal.  Musculoskeletal: Normal range of motion. He exhibits no edema or tenderness.  Lymphadenopathy:    He has no cervical adenopathy.  Neurological: He is alert. He has normal reflexes. No cranial nerve  deficit. Coordination normal.  Skin: Skin is warm and dry. No rash noted.  Psychiatric: He has a normal mood and affect. His behavior is normal.          Assessment & Plan:   Preventive health examination Episodic insomnia/shift work disorder.  We'll continue melatonin.  Will give a trial of trazodone

## 2014-03-08 NOTE — Progress Notes (Signed)
Pre visit review using our clinic review tool, if applicable. No additional management support is needed unless otherwise documented below in the visit note. 

## 2014-03-08 NOTE — Patient Instructions (Signed)

## 2014-04-13 ENCOUNTER — Encounter: Payer: Self-pay | Admitting: Internal Medicine

## 2014-04-13 ENCOUNTER — Ambulatory Visit (INDEPENDENT_AMBULATORY_CARE_PROVIDER_SITE_OTHER): Payer: 59 | Admitting: Internal Medicine

## 2014-04-13 VITALS — BP 130/82 | HR 80 | Temp 98.7°F | Resp 18 | Ht 73.5 in | Wt 195.0 lb

## 2014-04-13 DIAGNOSIS — J309 Allergic rhinitis, unspecified: Secondary | ICD-10-CM | POA: Diagnosis not present

## 2014-04-13 DIAGNOSIS — J4521 Mild intermittent asthma with (acute) exacerbation: Secondary | ICD-10-CM | POA: Diagnosis not present

## 2014-04-13 MED ORDER — METHYLPREDNISOLONE 4 MG PO KIT: PACK | ORAL | Status: DC

## 2014-04-13 MED ORDER — METHYLPREDNISOLONE ACETATE 80 MG/ML IJ SUSP
80.0000 mg | Freq: Once | INTRAMUSCULAR | Status: AC
  Administered 2014-04-13: 80 mg via INTRAMUSCULAR

## 2014-04-13 MED ORDER — BUDESONIDE-FORMOTEROL FUMARATE 160-4.5 MCG/ACT IN AERO: 2.0000 | INHALATION_SPRAY | Freq: Two times a day (BID) | RESPIRATORY_TRACT | Status: DC

## 2014-04-13 NOTE — Patient Instructions (Signed)
You had the steroid shot today  Please take all new medication as prescribed - the medrol pak, and the symbicort  Please continue all other medications as before, and refills have been done if requested.  Please have the pharmacy call with any other refills you may need.  Please keep your appointments with your specialists as you may have planned

## 2014-04-13 NOTE — Progress Notes (Signed)
Pre visit review using our clinic review tool, if applicable. No additional management support is needed unless otherwise documented below in the visit note. 

## 2014-04-13 NOTE — Progress Notes (Signed)
   Subjective:    Patient ID: Creed Coppericholas Bogan, male    DOB: 10/24/1980, 34 y.o.   MRN: [id number]  HPI  Here to f/u, long hx of allergic rhinitis and asthma, normally controlled on current meds, but today with marked increase congestion, sneeze, itch, nonprod cough and wheeze/sob with increase use of inhaler. Has some slight colored sputum, no fever and o/w does not feel ill.  Has significant seasonal allergies for several yrs.   Past Medical History  Diagnosis Date  . HYPERLIPIDEMIA 09/17/2009  . ALLERGIC RHINITIS 09/17/2009  . ASTHMA 09/17/2009   Past Surgical History  Procedure Laterality Date  . Sinus exploration  Nov 2014     per Dr. Jenne PaneBates     reports that he has never smoked. He has never used smokeless tobacco. He reports that he drinks alcohol. He reports that he does not use illicit drugs. family history is not on file. No Known Allergies Current Outpatient Prescriptions on File Prior to Visit  Medication Sig Dispense Refill  . fexofenadine (ALLEGRA) 180 MG tablet Take 180 mg by mouth daily.    . fluticasone (FLONASE) 50 MCG/ACT nasal spray 2 sprays by Nasal route daily.      . Melatonin 5 MG TABS Take 1 tablet by mouth at bedtime as needed.    . Olopatadine HCl (PATANASE) 0.6 % SOLN by Nasal route. 2 sprays to each nostril daily     . traZODone (DESYREL) 50 MG tablet Take 0.5-1 tablets (25-50 mg total) by mouth at bedtime as needed for sleep. 60 tablet 3   No current facility-administered medications on file prior to visit.    Review of Systems All otherwise neg per pt     Objective:   Physical Exam BP 130/82 mmHg  Pulse 80  Temp(Src) 98.7 F (37.1 C) (Oral)  Resp 18  Ht 6' 1.5" (1.867 m)  Wt 195 lb (88.451 kg)  BMI 25.38 kg/m2  SpO2 97% VS noted, not ill appearing Constitutional: Pt appears in no significant distress HENT: Head: NCAT.  Right Ear: External ear normal.  Left Ear: External ear normal.  Eyes: . Pupils are equal, round, and reactive to light.  Conjunctivae and EOM are normal Bilat tm's with mild erythema.  Max sinus areas tender.  Pharynx with mild erythema, no exudate Neck: Normal range of motion. Neck supple.  Cardiovascular: Normal rate and regular rhythm.   Pulmonary/Chest: Effort normal and breath sounds mild decreased without rales or wheezing.  Neurological: Pt is alert. Not confused , motor grossly intact Skin: Skin is warm. No rash, no LE edema Psychiatric: Pt behavior is normal. No agitation.         Assessment & Plan:

## 2014-04-15 NOTE — Assessment & Plan Note (Signed)
Mild to mod seasonal flare, for depomedrol IM, medrol pac asd,,  to f/u any worsening symptoms or concerns

## 2014-04-15 NOTE — Assessment & Plan Note (Signed)
Mild to mod uncontrolled, for depomedrol IM, symbicort asd,  to f/u any worsening symptoms or concerns

## 2014-04-19 ENCOUNTER — Encounter: Payer: Self-pay | Admitting: Internal Medicine

## 2014-04-19 ENCOUNTER — Ambulatory Visit (INDEPENDENT_AMBULATORY_CARE_PROVIDER_SITE_OTHER): Payer: 59 | Admitting: Internal Medicine

## 2014-04-19 ENCOUNTER — Ambulatory Visit (INDEPENDENT_AMBULATORY_CARE_PROVIDER_SITE_OTHER)
Admission: RE | Admit: 2014-04-19 | Discharge: 2014-04-19 | Disposition: A | Discharge status: Home / Self Care | Payer: 59 | Source: Ambulatory Visit | Attending: Internal Medicine | Admitting: Internal Medicine

## 2014-04-19 VITALS — BP 108/70 | HR 74 | Temp 98.3°F | Resp 18 | Ht 73.5 in | Wt 193.0 lb

## 2014-04-19 DIAGNOSIS — R05 Cough: Secondary | ICD-10-CM

## 2014-04-19 DIAGNOSIS — R059 Cough, unspecified: Secondary | ICD-10-CM

## 2014-04-19 DIAGNOSIS — J4521 Mild intermittent asthma with (acute) exacerbation: Secondary | ICD-10-CM

## 2014-04-19 DIAGNOSIS — J309 Allergic rhinitis, unspecified: Secondary | ICD-10-CM

## 2014-04-19 DIAGNOSIS — R051 Acute cough: Secondary | ICD-10-CM | POA: Insufficient documentation

## 2014-04-19 MED ORDER — FLUTICASONE-SALMETEROL 250-50 MCG/DOSE IN AEPB: 1.0000 | INHALATION_SPRAY | Freq: Two times a day (BID) | RESPIRATORY_TRACT | Status: DC

## 2014-04-19 MED ORDER — MOMETASONE FURO-FORMOTEROL FUM 200-5 MCG/ACT IN AERO: 2.0000 | INHALATION_SPRAY | Freq: Two times a day (BID) | RESPIRATORY_TRACT | Status: DC

## 2014-04-19 MED ORDER — HYDROCODONE-HOMATROPINE 5-1.5 MG/5ML PO SYRP
5.0000 mL | ORAL_SOLUTION | Freq: Four times a day (QID) | ORAL | Status: DC | PRN
Start: 2014-04-19 — End: 2014-05-23

## 2014-04-19 MED ORDER — LEVOFLOXACIN 500 MG PO TABS: 500.0000 mg | ORAL_TABLET | Freq: Every day | ORAL | Status: DC

## 2014-04-19 NOTE — Patient Instructions (Signed)
Please take all new medication as prescribed - the antibiotic, and cough med if needed, as well as either the [facility] or Advair if covered by your insurance  Please continue all other medications as before  Please have the pharmacy call with any other refills you may need.  Please keep your appointments with your specialists as you may have planned  Please go to the XRAY Department in the Basement (go straight as you get off the elevator) for the x-ray testing  You will be contacted by phone if any changes need to be made immediately.  Otherwise, you will receive a letter about your results with an explanation, but please check with MyChart first.  Please remember to sign up for MyChart if you have not done so, as this will be important to you in the future with finding out test results, communicating by private email, and scheduling acute appointments online when needed.

## 2014-04-19 NOTE — Progress Notes (Signed)
Pre visit review using our clinic review tool, if applicable. No additional management support is needed unless otherwise documented below in the visit note. 

## 2014-04-20 NOTE — Assessment & Plan Note (Signed)
stable overall by history and exam, recent data reviewed with pt, and pt to continue medical treatment as before,  to f/u any worsening symptoms or concerns, pt ins does not cover symbicort - to change to [facility]

## 2014-04-20 NOTE — Assessment & Plan Note (Signed)
Bronchitis vs pna, for cxr, Mild to mod, for antibx course,  to f/u any worsening symptoms or concerns, cough med prn

## 2014-04-20 NOTE — Assessment & Plan Note (Signed)
Mild to mod, for re-start allegra prn, flonase asd,  to f/u any worsening symptoms or concerns

## 2014-04-20 NOTE — Progress Notes (Signed)
   Subjective:    Patient ID: Tony [name], male    DOB: 09/16/1980, 34 y.o.   MRN: [id number]  HPI  Here with acute onset mild to mod 2-3 days ST, HA, general weakness and malaise, with prod cough greenish sputum, but Pt denies chest pain, increased sob or doe, wheezing, orthopnea, PND, increased LE swelling, palpitations, dizziness or syncope.  Does have several wks ongoing nasal allergy symptoms with clearish congestion, itch and sneezing, without fever, pain, ST, cough, swelling or wheezing. Past Medical History  Diagnosis Date  . HYPERLIPIDEMIA 09/17/2009  . ALLERGIC RHINITIS 09/17/2009  . ASTHMA 09/17/2009   Past Surgical History  Procedure Laterality Date  . Sinus exploration  Nov 2014     per Dr. Jenne PaneBates     reports that he has never smoked. He has never used smokeless tobacco. He reports that he drinks alcohol. He reports that he does not use illicit drugs. family history is not on file. No Known Allergies Current Outpatient Prescriptions on File Prior to Visit  Medication Sig Dispense Refill  . fexofenadine (ALLEGRA) 180 MG tablet Take 180 mg by mouth daily.    . fluticasone (FLONASE) 50 MCG/ACT nasal spray 2 sprays by Nasal route daily.      . Melatonin 5 MG TABS Take 1 tablet by mouth at bedtime as needed.    . Olopatadine HCl (PATANASE) 0.6 % SOLN by Nasal route. 2 sprays to each nostril daily     . traZODone (DESYREL) 50 MG tablet Take 0.5-1 tablets (25-50 mg total) by mouth at bedtime as needed for sleep. 60 tablet 3  . methylPREDNISolone (MEDROL, PAK,) 4 MG tablet follow package directions (Patient not taking: Reported on 04/19/2014) 21 tablet 0   No current facility-administered medications on file prior to visit.   Review of Systems  Constitutional: Negative for unusual diaphoresis or night sweats HENT: Negative for ringing in ear or discharge Eyes: Negative for double vision or worsening visual disturbance.  Respiratory: Negative for choking and stridor.     Gastrointestinal: Negative for vomiting or other signifcant bowel change Genitourinary: Negative for hematuria or change in urine volume.  Musculoskeletal: Negative for other MSK pain or swelling Skin: Negative for color change and worsening wound.  Neurological: Negative for tremors and numbness other than noted  Psychiatric/Behavioral: Negative for decreased concentration or agitation other than above       Objective:   Physical Exam BP 108/70 mmHg  Pulse 74  Temp(Src) 98.3 F (36.8 C) (Oral)  Resp 18  Ht 6' 1.5" (1.867 m)  Wt 193 lb 0.6 oz (87.562 kg)  BMI 25.12 kg/m2  SpO2 98% VS noted, mild ill Constitutional: Pt appears in no significant distress HENT: Head: NCAT.  Right Ear: External ear normal.  Left Ear: External ear normal.  Bilat tm's with mild erythema.  Max sinus areas non tender.  Pharynx with mild erythema, no exudate Eyes: . Pupils are equal, round, and reactive to light. Conjunctivae and EOM are normal Neck: Normal range of motion. Neck supple.  Cardiovascular: Normal rate and regular rhythm.   Pulmonary/Chest: Effort normal and breath sounds decreased without rales or wheezing.  Neurological: Pt is alert. Not confused , motor grossly intact Skin: Skin is warm. No rash, no LE edema Psychiatric: Pt behavior is normal. No agitation.     Assessment & Plan:

## 2014-05-23 ENCOUNTER — Encounter: Payer: Self-pay | Admitting: Family Medicine

## 2014-05-23 ENCOUNTER — Ambulatory Visit (INDEPENDENT_AMBULATORY_CARE_PROVIDER_SITE_OTHER): Payer: 59 | Admitting: Family Medicine

## 2014-05-23 VITALS — BP 120/82 | HR 78 | Temp 98.1°F | Ht 73.5 in | Wt 195.6 lb

## 2014-05-23 DIAGNOSIS — J309 Allergic rhinitis, unspecified: Secondary | ICD-10-CM

## 2014-05-23 DIAGNOSIS — J069 Acute upper respiratory infection, unspecified: Secondary | ICD-10-CM

## 2014-05-23 NOTE — Patient Instructions (Signed)
INSTRUCTIONS FOR UPPER RESPIRATORY INFECTION:  -plenty of rest and fluids  -AFRIN twice daily for 3-4 days then STOP, do not use longer then this  -nasal saline wash 2-3 times daily (use prepackaged nasal saline or bottled/distilled water if making your own)   -can use AFRIN nasal spray for drainage and nasal congestion - but do NOT use longer then 3-4 days  -can use tylenol (in no history of liver disease) or ibuprofen (if no history of kidney disease, bowel bleeding or significant heart disease) as directed for aches and sorethroat  -in the winter time, using a humidifier at night is helpful (please follow cleaning instructions)  -if you are taking a cough medication - use only as directed, may also try a teaspoon of honey to coat the throat and throat lozenges. If given a cough medication with codeine or hydrocodone or other narcotic please be advised that this contains a strong and  potentially addicting medication. Please follow instructions carefully, take as little as possible and only use AS NEEDED for severe cough. Discuss potential side effects with your pharmacy. Please do not drive or operate machinery while taking these types of medications. Please do not take other sedating medications, drugs or alcohol while taking this medication without discussing with your doctor.  -for sore throat, salt water gargles can help  -follow up if you have fevers, facial pain, tooth pain, difficulty breathing or are worsening or symptoms persist longer then expected  Upper Respiratory Infection, Adult An upper respiratory infection (URI) is also known as the common cold. It is often caused by a type of germ (virus). Colds are easily spread (contagious). You can pass it to others by kissing, coughing, sneezing, or drinking out of the same glass. Usually, you get better in 1 to 3  weeks.  However, the cough can last for even longer. HOME CARE   Only take medicine as told by your doctor. Follow  instructions provided above.  Drink enough water and fluids to keep your pee (urine) clear or pale yellow.  Get plenty of rest.  Return to work when your temperature is < 100 for 24 hours or as told by your doctor. You may use a face mask and wash your hands to stop your cold from spreading. GET HELP RIGHT AWAY IF:   After the first few days, you feel you are getting worse.  You have questions about your medicine.  You have chills, shortness of breath, or red spit (mucus).  You have pain in the face for more then 1-2 days, especially when you bend forward.  You have a fever, puffy (swollen) neck, pain when you swallow, or white spots in the back of your throat.  You have a bad headache, ear pain, sinus pain, or chest pain.  You have a high-pitched whistling sound when you breathe in and out (wheezing).  You cough up blood.  You have sore muscles or a stiff neck. MAKE SURE YOU:   Understand these instructions.  Will watch your condition.  Will get help right away if you are not doing well or get worse. Document Released: 06/11/2007 Document Revised: 03/17/2011 Document Reviewed: 03/30/2013 [facility] Patient Information 2015 [place], [place]. This information is not intended to replace advice given to you by your health care provider. Make sure you discuss any questions you have with your health care provider.

## 2014-05-23 NOTE — Progress Notes (Signed)
Pre visit review using our clinic review tool, if applicable. No additional management support is needed unless otherwise documented below in the visit note. 

## 2014-05-23 NOTE — Progress Notes (Signed)
HPI:  URI: -started: 1 week ago -symptoms:nasal congestion, sore throat, cough, scratchy throat, PND, hoarseness, now with pain L ear -denies:fever, SOB, NVD, tooth pain, facial pain, asthma symptoms -has tried: nothing, a little albuterol a few days ago for a cough -sick contacts/travel/risks: denies flu exposure, tick exposure or or Ebola risks, strep exposure -Hx of: allergies and mod persistent asthma on flonase, allegra, dulera, prn albuterol  ROS: See pertinent positives and negatives per HPI.  Past Medical History  Diagnosis Date  . HYPERLIPIDEMIA 09/17/2009  . ALLERGIC RHINITIS 09/17/2009  . ASTHMA 09/17/2009    Past Surgical History  Procedure Laterality Date  . Sinus exploration  Nov 2014     per Dr. Jenne PaneBates     No family history on file.  History   Social History  . Marital Status: Married    Spouse Name: N/A  . Number of Children: N/A  . Years of Education: N/A   Social History Main Topics  . Smoking status: Never Smoker   . Smokeless tobacco: Never Used  . Alcohol Use: Yes     Comment: occ  . Drug Use: No  . Sexual Activity: Not on file   Other Topics Concern  . None   Social History Narrative     Current outpatient prescriptions:  .  fexofenadine (ALLEGRA) 180 MG tablet, Take 180 mg by mouth daily., Disp: , Rfl:  .  fluticasone (FLONASE) 50 MCG/ACT nasal spray, 2 sprays by Nasal route daily.  , Disp: , Rfl:  .  Melatonin 5 MG TABS, Take 1 tablet by mouth at bedtime as needed., Disp: , Rfl:  .  mometasone-formoterol (DULERA) 200-5 MCG/ACT AERO, Inhale 2 puffs into the lungs 2 (two) times daily., Disp: 13 g, Rfl: 11 .  Olopatadine HCl (PATANASE) 0.6 % SOLN, by Nasal route. 2 sprays to each nostril daily , Disp: , Rfl:  .  traZODone (DESYREL) 50 MG tablet, Take 0.5-1 tablets (25-50 mg total) by mouth at bedtime as needed for sleep., Disp: 60 tablet, Rfl: 3  EXAM:  Filed Vitals:   05/23/14 1546  BP: 120/82  Pulse: 78  Temp: 98.1 F (36.7 C)     Body mass index is 25.45 kg/(m^2).  GENERAL: vitals reviewed and listed above, alert, oriented, appears well hydrated and in no acute distress  HEENT: atraumatic, conjunttiva clear, no obvious abnormalities on inspection of external nose and ears, normal appearance of ear canals and TMs with clear effusion on L, clear nasal congestion, mild post oropharyngeal erythema with PND, no tonsillar edema or exudate, no sinus TTP  NECK: no obvious masses on inspection  LUNGS: clear to auscultation bilaterally, no wheezes, rales or rhonchi, good air movement  CV: HRRR, no peripheral edema  MS: moves all extremities without noticeable abnormality  PSYCH: pleasant and cooperative, no obvious depression or anxiety  ASSESSMENT AND PLAN:  Discussed the following assessment and plan:  Viral upper respiratory infection  Allergic rhinitis, unspecified allergic rhinitis type  -given HPI and exam findings today, a serious infection or illness is unlikely. We discussed potential etiologies, with VURI being most likely, and advised supportive care and monitoring. We discussed treatment side effects, likely course, antibiotic misuse, transmission, and signs of developing a serious illness. -no signs or symptoms of bacterial illness today or of asthma exacerbation or bronchial involvement -nasal decongestant short course along with cont allergy asthma regimen and low threshhold for steroids if asthma symptoms - return precautions -of course, we advised to return or notify  a doctor immediately if symptoms worsen or persist or new concerns arise.    Patient Instructions  INSTRUCTIONS FOR UPPER RESPIRATORY INFECTION:  -plenty of rest and fluids  -AFRIN twice daily for 3-4 days then STOP, do not use longer then this  -nasal saline wash 2-3 times daily (use prepackaged nasal saline or bottled/distilled water if making your own)   -can use AFRIN nasal spray for drainage and nasal congestion - but do  NOT use longer then 3-4 days  -can use tylenol (in no history of liver disease) or ibuprofen (if no history of kidney disease, bowel bleeding or significant heart disease) as directed for aches and sorethroat  -in the winter time, using a humidifier at night is helpful (please follow cleaning instructions)  -if you are taking a cough medication - use only as directed, may also try a teaspoon of honey to coat the throat and throat lozenges. If given a cough medication with codeine or hydrocodone or other narcotic please be advised that this contains a strong and  potentially addicting medication. Please follow instructions carefully, take as little as possible and only use AS NEEDED for severe cough. Discuss potential side effects with your pharmacy. Please do not drive or operate machinery while taking these types of medications. Please do not take other sedating medications, drugs or alcohol while taking this medication without discussing with your doctor.  -for sore throat, salt water gargles can help  -follow up if you have fevers, facial pain, tooth pain, difficulty breathing or are worsening or symptoms persist longer then expected  Upper Respiratory Infection, Adult An upper respiratory infection (URI) is also known as the common cold. It is often caused by a type of germ (virus). Colds are easily spread (contagious). You can pass it to others by kissing, coughing, sneezing, or drinking out of the same glass. Usually, you get better in 1 to 3  weeks.  However, the cough can last for even longer. HOME CARE   Only take medicine as told by your doctor. Follow instructions provided above.  Drink enough water and fluids to keep your pee (urine) clear or pale yellow.  Get plenty of rest.  Return to work when your temperature is < 100 for 24 hours or as told by your doctor. You may use a face mask and wash your hands to stop your cold from spreading. GET HELP RIGHT AWAY IF:   After the first  few days, you feel you are getting worse.  You have questions about your medicine.  You have chills, shortness of breath, or red spit (mucus).  You have pain in the face for more then 1-2 days, especially when you bend forward.  You have a fever, puffy (swollen) neck, pain when you swallow, or white spots in the back of your throat.  You have a bad headache, ear pain, sinus pain, or chest pain.  You have a high-pitched whistling sound when you breathe in and out (wheezing).  You cough up blood.  You have sore muscles or a stiff neck. MAKE SURE YOU:   Understand these instructions.  Will watch your condition.  Will get help right away if you are not doing well or get worse. Document Released: 06/11/2007 Document Revised: 03/17/2011 Document Reviewed: 03/30/2013 [facility] Patient Information 2015 [place], [place]. This information is not intended to replace advice given to you by your health care provider. Make sure you discuss any questions you have with your health care provider.  Colin Benton R.

## 2014-09-09 ENCOUNTER — Encounter (HOSPITAL_COMMUNITY): Payer: Self-pay | Admitting: Emergency Medicine

## 2014-09-09 ENCOUNTER — Emergency Department (HOSPITAL_COMMUNITY)
Admission: EM | Admit: 2014-09-09 | Discharge: 2014-09-09 | Disposition: A | Discharge status: Home / Self Care | Payer: 59 | Source: Home / Self Care | Attending: Emergency Medicine | Admitting: Emergency Medicine

## 2014-09-09 DIAGNOSIS — R0981 Nasal congestion: Secondary | ICD-10-CM

## 2014-09-09 DIAGNOSIS — J0101 Acute recurrent maxillary sinusitis: Secondary | ICD-10-CM

## 2014-09-09 MED ORDER — METHYLPREDNISOLONE 4 MG PO TBPK
ORAL_TABLET | ORAL | Status: DC
Start: 2014-09-09 — End: 2015-03-16

## 2014-09-09 MED ORDER — CIPROFLOXACIN HCL 500 MG PO TABS: 500.0000 mg | ORAL_TABLET | Freq: Two times a day (BID) | ORAL | Status: DC

## 2014-09-09 NOTE — ED Provider Notes (Signed)
CSN: [id number]     Arrival date & time 09/09/14  1934 History   First MD Initiated Contact with Patient 09/09/14 2005     Chief Complaint  Patient presents with  . Sinusitis   (Consider location/radiation/quality/duration/timing/severity/associated sxs/prior Treatment) HPI Comments: 34 year old male complaining of a 2  To 3 week history of sinus congestion and "sinus infection". He states he has a long history of sinus infections for which she usually gets treated with anabiotic. He was recently treated with steroid-induced and Augmentin, was improved for a few days. Axel Filler was getting allergy testing he was off of his medications and his congestion came back. He is under the impression that he has an actual sinus infection and is requesting Cipro which has worked in the past. He is complaining of nasal congestion, frontal and paranasal/maxillary sinus pain.   Past Medical History  Diagnosis Date  . HYPERLIPIDEMIA 09/17/2009  . ALLERGIC RHINITIS 09/17/2009  . ASTHMA 09/17/2009   Past Surgical History  Procedure Laterality Date  . Sinus exploration  Nov 2014     per Dr. Jenne Pane    No family history on file. Social History  Substance Use Topics  . Smoking status: Never Smoker   . Smokeless tobacco: Never Used  . Alcohol Use: Yes     Comment: occ    Review of Systems  Constitutional: Negative for fever, diaphoresis, activity change and fatigue.  HENT: Positive for congestion and sinus pressure. Negative for ear pain, facial swelling, postnasal drip, rhinorrhea, sore throat and trouble swallowing.   Eyes: Negative for pain, discharge and redness.  Respiratory: Positive for cough. Negative for chest tightness and shortness of breath.   Cardiovascular: Negative.   Gastrointestinal: Negative.   Musculoskeletal: Negative.  Negative for neck pain and neck stiffness.  Neurological: Negative.     Allergies  Review of patient's allergies indicates no known allergies.  Home Medications    Prior to Admission medications   Medication Sig Start Date End Date Taking? Authorizing Provider  fexofenadine (ALLEGRA) 180 MG tablet Take 180 mg by mouth daily.   Yes Historical Provider, MD  fluticasone (FLONASE) 50 MCG/ACT nasal spray 2 sprays by Nasal route daily.     Yes Historical Provider, MD  mometasone-formoterol (DULERA) 200-5 MCG/ACT AERO Inhale 2 puffs into the lungs 2 (two) times daily. 04/19/14  Yes Corwin Levins, MD  Olopatadine HCl (PATANASE) 0.6 % SOLN by Nasal route. 2 sprays to each nostril daily    Yes Historical Provider, MD  traZODone (DESYREL) 50 MG tablet Take 0.5-1 tablets (25-50 mg total) by mouth at bedtime as needed for sleep. 03/08/14  Yes Gordy Savers, MD  ciprofloxacin (CIPRO) 500 MG tablet Take 1 tablet (500 mg total) by mouth 2 (two) times daily. 09/09/14   Hayden Rasmussen, NP  Melatonin 5 MG TABS Take 1 tablet by mouth at bedtime as needed.    Historical Provider, MD  methylPREDNISolone (MEDROL DOSEPAK) 4 MG TBPK tablet As directed 09/09/14   Hayden Rasmussen, NP   Meds Ordered and Administered this Visit  Medications - No data to display  BP 130/84 mmHg  Pulse 80  Temp(Src) 98.6 F (37 C) (Oral)  Resp 18  SpO2 100% No data found.   Physical Exam  Constitutional: He is oriented to person, place, and time. He appears well-developed and well-nourished. No distress.  HENT:  Mouth/Throat: No oropharyngeal exudate.  Bilateral TMs are normal. Tenderness to palpation of the maxillary and frontal sinuses. Oropharynx with minor clear PND.  Eyes: Conjunctivae are normal.  Neck: Normal range of motion. Neck supple.  Cardiovascular: Normal rate and regular rhythm.   Pulmonary/Chest: Effort normal and breath sounds normal. No respiratory distress.  Musculoskeletal: Normal range of motion. He exhibits no edema.  Neurological: He is alert and oriented to person, place, and time.  Skin: Skin is warm and dry. No rash noted.  Psychiatric: He has a normal mood and affect.   Nursing note and vitals reviewed.   ED Course  Procedures (including critical care time)  Labs Review Labs Reviewed - No data to display  Imaging Review No results found.   Visual Acuity Review  Right Eye Distance:   Left Eye Distance:   Bilateral Distance:    Right Eye Near:   Left Eye Near:    Bilateral Near:         MDM   1. Sinus congestion   2. Acute recurrent maxillary sinusitis    cipro at pt's requests as it has worked in the past Medrol dose pack Sudafed PE F/U with your ENT   Hayden Rasmussen, NP 09/09/14 2034

## 2014-09-09 NOTE — ED Notes (Signed)
Pt c/o persistent sinus inf onset 2-3 weeks Was placed on Augmentin x5 days w/no relief Sx include facial pressure, runny nose, and congestion Denies fevers, chills Alert... No acute distress.

## 2014-09-09 NOTE — Discharge Instructions (Signed)

## 2014-10-20 ENCOUNTER — Ambulatory Visit (INDEPENDENT_AMBULATORY_CARE_PROVIDER_SITE_OTHER): Payer: 59

## 2014-10-20 DIAGNOSIS — Z23 Encounter for immunization: Secondary | ICD-10-CM

## 2014-12-04 HISTORY — PX: NASAL SINUS SURGERY: SHX719

## 2015-03-09 ENCOUNTER — Other Ambulatory Visit (INDEPENDENT_AMBULATORY_CARE_PROVIDER_SITE_OTHER): Payer: 59

## 2015-03-09 DIAGNOSIS — Z0000 Encounter for general adult medical examination without abnormal findings: Secondary | ICD-10-CM

## 2015-03-09 LAB — CBC WITH DIFFERENTIAL/PLATELET
BASOS ABS: 0 10*3/uL (ref 0.0–0.1)
BASOS PCT: 0.5 % (ref 0.0–3.0)
Eosinophils Absolute: 0.3 10*3/uL (ref 0.0–0.7)
Eosinophils Relative: 5.3 % — ABNORMAL HIGH (ref 0.0–5.0)
HEMATOCRIT: 43.9 % (ref 39.0–52.0)
Hemoglobin: 14.8 g/dL (ref 13.0–17.0)
LYMPHS PCT: 35.8 % (ref 12.0–46.0)
Lymphs Abs: 1.8 10*3/uL (ref 0.7–4.0)
MCHC: 33.6 g/dL (ref 30.0–36.0)
MCV: 88.5 fl (ref 78.0–100.0)
MONOS PCT: 7.1 % (ref 3.0–12.0)
Monocytes Absolute: 0.4 10*3/uL (ref 0.1–1.0)
NEUTROS ABS: 2.5 10*3/uL (ref 1.4–7.7)
Neutrophils Relative %: 51.3 % (ref 43.0–77.0)
PLATELETS: 177 10*3/uL (ref 150.0–400.0)
RBC: 4.96 Mil/uL (ref 4.22–5.81)
RDW: 13.4 % (ref 11.5–15.5)
WBC: 4.9 10*3/uL (ref 4.0–10.5)

## 2015-03-09 LAB — LIPID PANEL
Cholesterol: 167 mg/dL (ref 0–200)
HDL: 42 mg/dL (ref >39.00)
LDL Cholesterol: 111 mg/dL — ABNORMAL HIGH (ref 0–99)
NONHDL: 125.26
Total CHOL/HDL Ratio: 4
Triglycerides: 70 mg/dL (ref 0.0–149.0)
VLDL: 14 mg/dL (ref 0.0–40.0)

## 2015-03-09 LAB — HEPATIC FUNCTION PANEL
ALBUMIN: 4.2 g/dL (ref 3.5–5.2)
ALK PHOS: 64 U/L (ref 39–117)
ALT: 14 U/L (ref 0–53)
AST: 16 U/L (ref 0–37)
Bilirubin, Direct: 0.1 mg/dL (ref 0.0–0.3)
TOTAL PROTEIN: 6.4 g/dL (ref 6.0–8.3)
Total Bilirubin: 0.6 mg/dL (ref 0.2–1.2)

## 2015-03-09 LAB — POC URINALSYSI DIPSTICK (AUTOMATED)
Bilirubin, UA: NEGATIVE
GLUCOSE UA: NEGATIVE
Ketones, UA: NEGATIVE
Leukocytes, UA: NEGATIVE
NITRITE UA: NEGATIVE
PH UA: 7
PROTEIN UA: NEGATIVE
RBC UA: NEGATIVE
Spec Grav, UA: 1.015
UROBILINOGEN UA: 0.2

## 2015-03-09 LAB — BASIC METABOLIC PANEL
BUN: 17 mg/dL (ref 6–23)
CHLORIDE: 103 meq/L (ref 96–112)
CO2: 30 meq/L (ref 19–32)
Calcium: 9.1 mg/dL (ref 8.4–10.5)
Creatinine, Ser: 0.88 mg/dL (ref 0.40–1.50)
GFR: 106.23 mL/min (ref >60.00)
Glucose, Bld: 85 mg/dL (ref 70–99)
POTASSIUM: 4.2 meq/L (ref 3.5–5.1)
SODIUM: 139 meq/L (ref 135–145)

## 2015-03-09 LAB — TSH: TSH: 1.46 u[IU]/mL (ref 0.35–4.50)

## 2015-03-16 ENCOUNTER — Encounter: Payer: Self-pay | Admitting: Internal Medicine

## 2015-03-16 ENCOUNTER — Ambulatory Visit (INDEPENDENT_AMBULATORY_CARE_PROVIDER_SITE_OTHER): Payer: 59 | Admitting: Internal Medicine

## 2015-03-16 VITALS — BP 110/72 | HR 76 | Temp 97.8°F | Resp 20 | Ht 73.5 in | Wt 191.0 lb

## 2015-03-16 DIAGNOSIS — J4520 Mild intermittent asthma, uncomplicated: Secondary | ICD-10-CM

## 2015-03-16 DIAGNOSIS — J3089 Other allergic rhinitis: Secondary | ICD-10-CM | POA: Diagnosis not present

## 2015-03-16 DIAGNOSIS — Z0000 Encounter for general adult medical examination without abnormal findings: Secondary | ICD-10-CM | POA: Diagnosis not present

## 2015-03-16 DIAGNOSIS — E785 Hyperlipidemia, unspecified: Secondary | ICD-10-CM

## 2015-03-16 NOTE — Progress Notes (Signed)
Subjective:    Patient ID: Tony [name], male    DOB: 01-12-1980, 35 y.o.   MRN: [id number]  HPI   35 year old patient who is seen today for a health maintenance examination.  He has a history of allergic rhinitis and asthma.  He has required immunotherapy in the past.  Doing quite well today. He works as a Emergency planning/management officer with shift work and had  some insomnia issues in the past.  He now works regular hours and insomnia is stable  Complains some occasional mild right knee pain  Past Medical History  Diagnosis Date  . HYPERLIPIDEMIA 09/17/2009  . ALLERGIC RHINITIS 09/17/2009  . ASTHMA 09/17/2009    Social History   Social History  . Marital Status: Married    Spouse Name: N/A  . Number of Children: N/A  . Years of Education: N/A   Occupational History  . Not on file.   Social History Main Topics  . Smoking status: Never Smoker   . Smokeless tobacco: Never Used  . Alcohol Use: Yes     Comment: occ  . Drug Use: No  . Sexual Activity: Not on file   Other Topics Concern  . Not on file   Social History Narrative    Past Surgical History  Procedure Laterality Date  . Sinus exploration  Nov 2014     per Dr. Jenne Pane   . Nasal sinus surgery Right 12/04/2014    Dr. Jenne Pane    No family history on file.  No Known Allergies  Current Outpatient Prescriptions on File Prior to Visit  Medication Sig Dispense Refill  . fexofenadine (ALLEGRA) 180 MG tablet Take 180 mg by mouth daily.    . fluticasone (FLONASE) 50 MCG/ACT nasal spray 2 sprays by Nasal route daily.      . mometasone-formoterol (DULERA) 200-5 MCG/ACT AERO Inhale 2 puffs into the lungs 2 (two) times daily. 13 g 11  . Olopatadine HCl (PATANASE) 0.6 % SOLN by Nasal route. 2 sprays to each nostril daily      No current facility-administered medications on file prior to visit.    BP 110/72 mmHg  Pulse 76  Temp(Src) 97.8 F (36.6 C) (Oral)  Resp 20  Ht 6' 1.5" (1.867 m)  Wt 191 lb (86.637 kg)  BMI 24.86  kg/m2  SpO2 98%     Review of Systems  Constitutional: Negative for fever, chills, appetite change and fatigue.  HENT: Negative for congestion, dental problem, ear pain, hearing loss, sore throat, tinnitus, trouble swallowing and voice change.   Eyes: Negative for pain, discharge and visual disturbance.  Respiratory: Negative for cough, chest tightness, wheezing and stridor.   Cardiovascular: Negative for chest pain, palpitations and leg swelling.  Gastrointestinal: Negative for nausea, vomiting, abdominal pain, diarrhea, constipation, blood in stool and abdominal distention.  Genitourinary: Negative for urgency, hematuria, flank pain, discharge, difficulty urinating and genital sores.  Musculoskeletal: Negative for myalgias, back pain, joint swelling, arthralgias, gait problem and neck stiffness.  Skin: Negative for rash.  Neurological: Negative for dizziness, syncope, speech difficulty, weakness, numbness and headaches.  Hematological: Negative for adenopathy. Does not bruise/bleed easily.  Psychiatric/Behavioral: Positive for sleep disturbance. Negative for behavioral problems and dysphoric mood. The patient is not nervous/anxious.        Objective:   Physical Exam  Constitutional: He appears well-developed and well-nourished.  HENT:  Head: Normocephalic and atraumatic.  Right Ear: External ear normal.  Left Ear: External ear normal.  Nose: Nose normal.  Mouth/Throat: Oropharynx  is clear and moist.  Eyes: Conjunctivae and EOM are normal. Pupils are equal, round, and reactive to light. No scleral icterus.  Neck: Normal range of motion. Neck supple. No JVD present. No thyromegaly present.  Cardiovascular: Regular rhythm, normal heart sounds and intact distal pulses.  Exam reveals no gallop and no friction rub.   No murmur heard. Pulmonary/Chest: Effort normal and breath sounds normal. He exhibits no tenderness.  Abdominal: Soft. Bowel sounds are normal. He exhibits no distension  and no mass. There is no tenderness.  Genitourinary: Prostate normal and penis normal.  Musculoskeletal: Normal range of motion. He exhibits no edema or tenderness.  Lymphadenopathy:    He has no cervical adenopathy.  Neurological: He is alert. He has normal reflexes. No cranial nerve deficit. Coordination normal.  Skin: Skin is warm and dry. No rash noted.  Psychiatric: He has a normal mood and affect. His behavior is normal.          Assessment & Plan:   Preventive health examination Asthma, stable History of allergic rhinitis History mild dyslipidemia.  Resolved on better diet and exercise regimen

## 2015-03-16 NOTE — Progress Notes (Signed)
Pre visit review using our clinic review tool, if applicable. No additional management support is needed unless otherwise documented below in the visit note. 

## 2015-03-16 NOTE — Patient Instructions (Signed)

## 2015-05-18 ENCOUNTER — Ambulatory Visit (INDEPENDENT_AMBULATORY_CARE_PROVIDER_SITE_OTHER): Payer: 59 | Admitting: Internal Medicine

## 2015-05-18 ENCOUNTER — Encounter: Payer: Self-pay | Admitting: Internal Medicine

## 2015-05-18 VITALS — BP 120/88 | HR 79 | Temp 98.4°F | Resp 20 | Ht 73.5 in | Wt 192.0 lb

## 2015-05-18 DIAGNOSIS — J3089 Other allergic rhinitis: Secondary | ICD-10-CM | POA: Diagnosis not present

## 2015-05-18 MED ORDER — LEVOFLOXACIN 750 MG PO TABS: 750.0000 mg | ORAL_TABLET | Freq: Every day | ORAL | Status: DC

## 2015-05-18 MED ORDER — METHYLPREDNISOLONE 4 MG PO TABS: 4.0000 mg | ORAL_TABLET | Freq: Two times a day (BID) | ORAL | Status: DC

## 2015-05-18 NOTE — Progress Notes (Signed)
Pre visit review using our clinic review tool, if applicable. No additional management support is needed unless otherwise documented below in the visit note. 

## 2015-05-18 NOTE — Patient Instructions (Signed)
HOME CARE INSTRUCTIONS  Drink plenty of water. Water helps thin the mucus so your sinuses can drain more easily.  Use a humidifier.  Inhale steam 3-4 times a day (for example, sit in the bathroom with the shower running).  Apply a warm, moist washcloth to your face 3-4 times a day, or as directed by your health care provider.  Use saline nasal sprays to help moisten and clean your sinuses.  Take medicines only as directed by your health care provider.  If you were prescribed either an antibiotic or antifungal medicine, finish it all even if you start to feel better. SEEK IMMEDIATE MEDICAL CARE IF:  You have increasing pain or severe headaches.  You have nausea, vomiting, or drowsiness.  You have swelling around your face.  You have vision problems.  You have a stiff neck.  You have difficulty breathing.

## 2015-05-18 NOTE — Progress Notes (Signed)
Subjective:    Patient ID: Tony [name], male    DOB: 04/18/1980, 35 y.o.   MRN: [id number]  HPI  35 year old patient who has a long history of allergic rhinitis.  He also has a history of recurrent sinusitis.  In November of last year he underwent sinus surgery to open up his right frontal sinus area.  In October 2015.  He had extensive sinus surgery to decompress both maxillary and ethmoid sinuses. He was seen at an urgent care about one month ago and treated for recurrent sinusitis, initially with Augmentin.  This medication is generally well tolerated, but was discontinued after 5 days due to GI side effects.  He then completed 7 days of ciprofloxacin.  Initially improved but more recently has had worsening sinus congestion, headaches, especially over the frontal sinus area, left greater than the right.  Denies any fever but has had worsening yellow-green drainage and pressure.  Past Medical History  Diagnosis Date  . HYPERLIPIDEMIA 09/17/2009  . ALLERGIC RHINITIS 09/17/2009  . ASTHMA 09/17/2009     Social History   Social History  . Marital Status: Married    Spouse Name: N/A  . Number of Children: N/A  . Years of Education: N/A   Occupational History  . Not on file.   Social History Main Topics  . Smoking status: Never Smoker   . Smokeless tobacco: Never Used  . Alcohol Use: Yes     Comment: occ  . Drug Use: No  . Sexual Activity: Not on file   Other Topics Concern  . Not on file   Social History Narrative    Past Surgical History  Procedure Laterality Date  . Sinus exploration  Nov 2014     per Dr. Jenne PaneBates   . Nasal sinus surgery Right 12/04/2014    Dr. Jenne PaneBates    No family history on file.  No Known Allergies  Current Outpatient Prescriptions on File Prior to Visit  Medication Sig Dispense Refill  . fexofenadine (ALLEGRA) 180 MG tablet Take 180 mg by mouth daily.    . fluticasone (FLONASE) 50 MCG/ACT nasal spray 2 sprays by Nasal route daily.      .  montelukast (SINGULAIR) 10 MG tablet TAKE 1 TABLET IN THE EVENING ONCE A DAY  5  . Olopatadine HCl (PATANASE) 0.6 % SOLN by Nasal route. 2 sprays to each nostril daily      No current facility-administered medications on file prior to visit.    BP 120/88 mmHg  Pulse 79  Temp(Src) 98.4 F (36.9 C) (Oral)  Resp 20  Ht 6' 1.5" (1.867 m)  Wt 192 lb (87.091 kg)  BMI 24.99 kg/m2  SpO2 98%     Review of Systems  Constitutional: Positive for activity change, appetite change and fatigue. Negative for fever and chills.  HENT: Positive for congestion, postnasal drip, rhinorrhea and sinus pressure. Negative for dental problem, ear pain, hearing loss, sore throat, tinnitus, trouble swallowing and voice change.   Eyes: Negative for pain, discharge and visual disturbance.  Respiratory: Negative for cough, chest tightness, wheezing and stridor.   Cardiovascular: Negative for chest pain, palpitations and leg swelling.  Gastrointestinal: Negative for nausea, vomiting, abdominal pain, diarrhea, constipation, blood in stool and abdominal distention.  Genitourinary: Negative for urgency, hematuria, flank pain, discharge, difficulty urinating and genital sores.  Musculoskeletal: Negative for myalgias, back pain, joint swelling, arthralgias, gait problem and neck stiffness.  Skin: Negative for rash.  Neurological: Positive for headaches. Negative for dizziness, syncope,  speech difficulty, weakness and numbness.  Hematological: Negative for adenopathy. Does not bruise/bleed easily.  Psychiatric/Behavioral: Negative for behavioral problems and dysphoric mood. The patient is not nervous/anxious.        Objective:   Physical Exam  Constitutional: He is oriented to person, place, and time. He appears well-developed and well-nourished. No distress.  No distress Afebrile Nontoxic  HENT:  Head: Normocephalic.  Right Ear: External ear normal.  Left Ear: External ear normal.  Mild bilateral frontal sinus  tenderness  Eyes: Conjunctivae and EOM are normal.  Neck: Normal range of motion.  Cardiovascular: Normal rate and normal heart sounds.   Pulmonary/Chest: Breath sounds normal.  Abdominal: Bowel sounds are normal.  Musculoskeletal: Normal range of motion. He exhibits no edema or tenderness.  Neurological: He is alert and oriented to person, place, and time.  Psychiatric: He has a normal mood and affect. His behavior is normal.          Assessment & Plan:   Recurrent sinusitis.  Will retreat with 7 days of Levaquin Continue nasal irrigation and decongestants  Compliance with antibiotics.  Discussed Continue maintenance regimen

## 2015-07-23 ENCOUNTER — Ambulatory Visit: Payer: 59 | Admitting: Family Medicine

## 2015-07-23 DIAGNOSIS — Z0289 Encounter for other administrative examinations: Secondary | ICD-10-CM

## 2015-08-01 ENCOUNTER — Emergency Department (HOSPITAL_COMMUNITY)
Admission: EM | Admit: 2015-08-01 | Discharge: 2015-08-01 | Disposition: A | Discharge status: Home / Self Care | Payer: 59 | Attending: Physician Assistant | Admitting: Physician Assistant

## 2015-08-01 ENCOUNTER — Encounter (HOSPITAL_COMMUNITY): Payer: Self-pay | Admitting: Emergency Medicine

## 2015-08-01 DIAGNOSIS — W293XXA Contact with powered garden and outdoor hand tools and machinery, initial encounter: Secondary | ICD-10-CM | POA: Diagnosis not present

## 2015-08-01 DIAGNOSIS — S81812A Laceration without foreign body, left lower leg, initial encounter: Secondary | ICD-10-CM | POA: Diagnosis not present

## 2015-08-01 DIAGNOSIS — Y999 Unspecified external cause status: Secondary | ICD-10-CM | POA: Insufficient documentation

## 2015-08-01 DIAGNOSIS — J45909 Unspecified asthma, uncomplicated: Secondary | ICD-10-CM | POA: Insufficient documentation

## 2015-08-01 DIAGNOSIS — Y939 Activity, unspecified: Secondary | ICD-10-CM | POA: Diagnosis not present

## 2015-08-01 DIAGNOSIS — Y929 Unspecified place or not applicable: Secondary | ICD-10-CM | POA: Diagnosis not present

## 2015-08-01 DIAGNOSIS — E785 Hyperlipidemia, unspecified: Secondary | ICD-10-CM | POA: Insufficient documentation

## 2015-08-01 MED ORDER — LIDOCAINE-EPINEPHRINE 2 %-1:100000 IJ SOLN
10.0000 mL | Freq: Once | INTRAMUSCULAR | Status: AC
  Administered 2015-08-01: 3 mL
  Filled 2015-08-01: qty 1

## 2015-08-01 MED ORDER — BACITRACIN ZINC 500 UNIT/GM EX OINT
TOPICAL_OINTMENT | CUTANEOUS | Status: AC
  Filled 2015-08-01: qty 0.9

## 2015-08-01 MED ORDER — LIDOCAINE-EPINEPHRINE (PF) 2 %-1:200000 IJ SOLN: 10.0000 mL | Freq: Once | INTRAMUSCULAR | Status: DC

## 2015-08-01 MED ORDER — BACITRACIN ZINC 500 UNIT/GM EX OINT
1.0000 | TOPICAL_OINTMENT | Freq: Two times a day (BID) | CUTANEOUS | Status: DC
Start: 2015-08-01 — End: 2015-08-01
  Administered 2015-08-01: 1 via TOPICAL
  Filled 2015-08-01: qty 28.35

## 2015-08-01 MED ORDER — BACITRACIN ZINC 500 UNIT/GM EX OINT: 1.0000 "application " | TOPICAL_OINTMENT | Freq: Two times a day (BID) | CUTANEOUS | 1 refills | Status: DC

## 2015-08-01 NOTE — ED Provider Notes (Signed)
[facility]-EMERGENCY DEPT Provider Note   CSN: [id number] Arrival date & time: 08/01/15  1633  First Provider Contact:  4:53 PM    By signing my name below, I, Placido Sou, attest that this documentation has been prepared under the direction and in the presence of Everlene Farrier, PA-C. Electronically Signed: Placido Sou, ED Scribe. 08/01/15. 4:58 PM.   History   Chief Complaint Chief Complaint  Patient presents with  . Laceration    HPI HPI Comments: Tony [name] is a 35 y.o. male who presents to the Emergency Department complaining of a laceration with controlled bleeding just inferior to his left anterior knee which occurred PTA. Pt was attempting to start a chainsaw and when pulling the drawstring it caused the blade to strike the affected region causing his laceration noting the chainsaw was not running at the time. Pt was initially evaluated at [facility] and was instructed to come to the ED. He reports associated, mild, pain surrounding the wound. His last tetanus vaccination was in 2013. He denies numbness, tingling, weakness or any other associated symptoms at this time.   The history is provided by the patient. No language interpreter was used.    Past Medical History:  Diagnosis Date  . ALLERGIC RHINITIS 09/17/2009  . ASTHMA 09/17/2009  . HYPERLIPIDEMIA 09/17/2009    Patient Active Problem List   Diagnosis Date Noted  . Cough 04/19/2014  . Dyslipidemia 09/17/2009  . Allergic rhinitis 09/17/2009  . Asthma 09/17/2009    Past Surgical History:  Procedure Laterality Date  . NASAL SINUS SURGERY Right 12/04/2014   Dr. Jenne Pane  . SINUS EXPLORATION  Nov 2014    per Dr. Jenne Pane     Home Medications    Prior to Admission medications   Medication Sig Start Date End Date Taking? Authorizing Provider  bacitracin ointment Apply 1 application topically 2 (two) times daily. 08/01/15   Everlene Farrier, PA-C  fexofenadine (ALLEGRA) 180 MG tablet Take 180 mg by mouth daily.     Historical Provider, MD  fluticasone (FLONASE) 50 MCG/ACT nasal spray 2 sprays by Nasal route daily.      Historical Provider, MD  levofloxacin (LEVAQUIN) 750 MG tablet Take 1 tablet (750 mg total) by mouth daily. 05/18/15   Gordy Savers, MD  methylPREDNISolone (MEDROL) 4 MG tablet Take 1 tablet (4 mg total) by mouth 2 (two) times daily. 05/18/15   Gordy Savers, MD  montelukast (SINGULAIR) 10 MG tablet TAKE 1 TABLET IN THE EVENING ONCE A DAY 03/08/15   Historical Provider, MD  Olopatadine HCl (PATANASE) 0.6 % SOLN by Nasal route. 2 sprays to each nostril daily     Historical Provider, MD  umeclidinium-vilanterol (ANORO ELLIPTA) 62.5-25 MCG/INH AEPB Inhale 1 puff into the lungs daily.    Historical Provider, MD    Family History History reviewed. No pertinent family history.  Social History Social History  Substance Use Topics  . Smoking status: Never Smoker  . Smokeless tobacco: Never Used  . Alcohol use Yes     Comment: occ    Allergies   Review of patient's allergies indicates no known allergies.  Review of Systems Review of Systems  Constitutional: Negative for fever.  Musculoskeletal: Positive for myalgias.  Skin: Positive for wound.  Neurological: Negative for weakness and numbness.   Physical Exam Updated Vital Signs BP 127/95 (BP Location: Right Arm)   Pulse 85   Temp 97.6 F (36.4 C) (Oral)   Resp 16   SpO2 100%   Physical  Exam  Constitutional: He appears well-developed and well-nourished. No distress.  HENT:  Head: Normocephalic and atraumatic.  Eyes: Right eye exhibits no discharge. Left eye exhibits no discharge.  Cardiovascular: Normal rate, regular rhythm and intact distal pulses.   Bilateral dorsalis pedis and posterior tibialis pulses are intact.  Pulmonary/Chest: Effort normal. No respiratory distress.  Musculoskeletal: Normal range of motion. He exhibits no tenderness or deformity.  Neurological: He is alert. Coordination normal.  Sensation  intact  Skin: Skin is warm and dry. Capillary refill takes less than 2 seconds. No rash noted. He is not diaphoretic. No erythema. No pallor.  3.5 cm laceration distal to the left anterior knee. Superficial laceration. No deep tissue seen. No tendon involvement.  bleeding controlled  Psychiatric: He has a normal mood and affect. His behavior is normal.  Nursing note and vitals reviewed.  ED Treatments / Results  Labs (all labs ordered are listed, but only abnormal results are displayed) Labs Reviewed - No data to display  EKG  EKG Interpretation None       Radiology No results found.  Procedures .Marland KitchenLaceration Repair Date/Time: 08/01/2015 5:32 PM Performed by: Everlene Farrier Authorized by: Bary Castilla LYN   Consent:    Consent obtained:  Verbal   Consent given by:  Patient   Risks discussed:  Infection, need for additional repair and poor wound healing Anesthesia (see MAR for exact dosages):    Anesthesia method:  Local infiltration   Local anesthetic:  Lidocaine 2% WITH epi Laceration details:    Location:  Leg   Leg location:  L lower leg   Length (cm):  3.5 Repair type:    Repair type:  Simple Pre-procedure details:    Preparation:  Patient was prepped and draped in usual sterile fashion Exploration:    Hemostasis achieved with:  Direct pressure   Wound exploration: wound explored through full range of motion and entire depth of wound probed and visualized     Wound extent: no fascia violation noted, no foreign bodies/material noted, no muscle damage noted, no nerve damage noted, no tendon damage noted and no vascular damage noted     Contaminated: no   Treatment:    Area cleansed with:  Saline   Amount of cleaning:  Extensive   Irrigation solution:  Sterile saline   Irrigation volume:  500 ml   Irrigation method:  Pressure wash   Visualized foreign bodies/material removed: no   Skin repair:    Repair method:  Sutures   Suture size:  4-0   Suture  material:  Prolene   Suture technique:  Horizontal mattress   Number of sutures:  4 Approximation:    Approximation:  Close   Vermilion border: well-aligned   Post-procedure details:    Dressing:  Antibiotic ointment and non-adherent dressing   Patient tolerance of procedure:  Tolerated well, no immediate complications      DIAGNOSTIC STUDIES: Oxygen Saturation is 100% on RA, normal by my interpretation.    COORDINATION OF CARE: 4:58 PM Discussed next steps with pt. Pt verbalized understanding and is agreeable with the plan.    Medications Ordered in ED Medications  bacitracin ointment 1 application (not administered)  bacitracin 500 UNIT/GM ointment (not administered)  lidocaine-EPINEPHrine (XYLOCAINE W/EPI) 2 %-1:100000 (with pres) injection 10 mL (3 mLs Infiltration Given 08/01/15 1747)     Initial Impression / Assessment and Plan / ED Course  I have reviewed the triage vital signs and the nursing notes.  Pertinent labs &  imaging results that were available during my care of the patient were reviewed by me and considered in my medical decision making (see chart for details).  Clinical Course   Patient with Superficial 3 cm laceration distal to his left anterior knee. No evidence of deep tissue involvement. No evidence of foreign body. Laceration repaired by me and tolerated well by the patient. Patient had three 4-0 proline horizontal mattress stitches and one simple interrupted stitch. Tetanus UTD. Laceration occurred < 12 hours prior to repair. Discussed laceration care with pt and answered questions. Pt to f-u for suture removal in 7-10 days and wound check sooner should there be signs of dehiscence or infection. Pt is hemodynamically stable with no complaints prior to dc.  I advised the patient to follow-up with their primary care provider this week. I advised the patient to return to the emergency department with new or worsening symptoms or new concerns. The patient  verbalized understanding and agreement with plan.    I personally performed the services described in this documentation, which was scribed in my presence. The recorded information has been reviewed and is accurate.       Final Clinical Impressions(s) / ED Diagnoses   Final diagnoses:  Laceration of leg, left, initial encounter    New Prescriptions New Prescriptions   BACITRACIN OINTMENT    Apply 1 application topically 2 (two) times daily.     Everlene Farrier, PA-C 08/01/15 1754    Courteney Randall An, MD 08/02/15 1757

## 2015-08-01 NOTE — ED Notes (Signed)
Pt states that he cut his leg with a chainsaw, "was not running", but pt recently had the chainsaw sharpened.

## 2015-08-01 NOTE — ED Triage Notes (Signed)
Pt presents with approx 1.5 inch laceration to left knee. Pt reports cutting his leg with a chainsaw. Bleeding controlled.

## 2015-08-09 ENCOUNTER — Encounter: Payer: Self-pay | Admitting: Family Medicine

## 2015-08-09 ENCOUNTER — Ambulatory Visit (INDEPENDENT_AMBULATORY_CARE_PROVIDER_SITE_OTHER): Payer: 59 | Admitting: Family Medicine

## 2015-08-09 VITALS — BP 120/72 | HR 70 | Temp 97.9°F | Ht 73.5 in | Wt 195.2 lb

## 2015-08-09 DIAGNOSIS — IMO0002 Reserved for concepts with insufficient information to code with codable children: Secondary | ICD-10-CM

## 2015-08-09 DIAGNOSIS — T148 Other injury of unspecified body region: Secondary | ICD-10-CM

## 2015-08-09 NOTE — Progress Notes (Signed)
Pre visit review using our clinic review tool, if applicable. No additional management support is needed unless otherwise documented below in the visit note. 

## 2015-08-09 NOTE — Progress Notes (Signed)
  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 Suffered lac to L leg with chain saw last week and treated in ER. Has done well. No pain, drainage, fevers, malaise.   ROS: See pertinent positives and negatives per HPI.  Past Medical History:  Diagnosis Date  . ALLERGIC RHINITIS 09/17/2009  . ASTHMA 09/17/2009  . HYPERLIPIDEMIA 09/17/2009    Past Surgical History:  Procedure Laterality Date  . NASAL SINUS SURGERY Right 12/04/2014   Dr. Jenne Pane  . SINUS EXPLORATION  Nov 2014    per Dr. Jenne Pane     No family history on file.  Social History   Social History  . Marital status: Married    Spouse name: N/A  . Number of children: N/A  . Years of education: N/A   Social History Main Topics  . Smoking status: Never Smoker  . Smokeless tobacco: Never Used  . Alcohol use Yes     Comment: occ  . Drug use: No  . Sexual activity: Not Asked   Other Topics Concern  . None   Social History Narrative  . None     Current Outpatient Prescriptions:  .  ARNUITY ELLIPTA 100 MCG/ACT AEPB, , Disp: , Rfl:  .  bacitracin ointment, Apply 1 application topically 2 (two) times daily., Disp: 15 g, Rfl: 1 .  fexofenadine (ALLEGRA) 180 MG tablet, Take 180 mg by mouth daily., Disp: , Rfl:  .  fluticasone (FLONASE) 50 MCG/ACT nasal spray, 2 sprays by Nasal route daily.  , Disp: , Rfl:  .  montelukast (SINGULAIR) 10 MG tablet, TAKE 1 TABLET IN THE EVENING ONCE A DAY, Disp: , Rfl: 5 .  Olopatadine HCl (PATANASE) 0.6 % SOLN, by Nasal route. 2 sprays to each nostril daily , Disp: , Rfl:   EXAM:  Vitals:   08/09/15 1318  BP: 120/72  Pulse: 70  Temp: 97.9 F (36.6 C)    Body mass index is 25.4 kg/m.  GENERAL: vitals reviewed and listed above, alert, oriented, appears well hydrated and in no acute distress  HEENT: atraumatic, conjunttiva clear, no obvious abnormalities on inspection of external nose and ears  NECK: no obvious masses on inspection  Healing  lac L leg below knee, no signs of infection, looks to be well closed, several horizontal martress sutures  MS: moves all extremities without noticeable abnormality  PSYCH: pleasant and cooperative, no obvious depression or anxiety  ASSESSMENT AND PLAN:  Discussed the following assessment and plan:  Skin laceration  -sutures removed, area cleaned, steristrips applied -wound care recs -Patient advised to return or notify a doctor immediately if symptoms worsen or persist or new concerns arise.  There are no Patient Instructions on file for this visit.  Kriste Basque R., DO

## 2015-08-16 ENCOUNTER — Telehealth: Payer: Self-pay | Admitting: Internal Medicine

## 2015-08-20 NOTE — Telephone Encounter (Signed)
error 

## 2015-08-30 ENCOUNTER — Ambulatory Visit: Payer: Self-pay | Admitting: Family Medicine

## 2015-10-11 ENCOUNTER — Ambulatory Visit (INDEPENDENT_AMBULATORY_CARE_PROVIDER_SITE_OTHER): Payer: 59 | Admitting: Family Medicine

## 2015-10-11 ENCOUNTER — Encounter: Payer: Self-pay | Admitting: Family Medicine

## 2015-10-11 VITALS — BP 108/72 | HR 78 | Temp 98.4°F | Ht 73.75 in | Wt 193.2 lb

## 2015-10-11 DIAGNOSIS — G8929 Other chronic pain: Secondary | ICD-10-CM

## 2015-10-11 DIAGNOSIS — R195 Other fecal abnormalities: Secondary | ICD-10-CM | POA: Diagnosis not present

## 2015-10-11 DIAGNOSIS — K589 Irritable bowel syndrome without diarrhea: Secondary | ICD-10-CM | POA: Insufficient documentation

## 2015-10-11 DIAGNOSIS — M25561 Pain in right knee: Secondary | ICD-10-CM | POA: Diagnosis not present

## 2015-10-11 NOTE — Progress Notes (Signed)
Subjective:  Tony [name] is a 35 y.o. year old very pleasant male patient who presents for/with See problem oriented charting ROS- No chest pain or shortness of breath (other than rarely with asthma). No headache or blurry vision. see any ROS included in HPI as well.   Past Medical History-  Patient Active Problem List   Diagnosis Date Noted  . Dyslipidemia 09/17/2009    Priority: Medium  . Allergic rhinitis 09/17/2009    Priority: Medium  . Asthma 09/17/2009    Priority: Medium  . Loose stools 10/11/2015    Medications- reviewed and updated Current Outpatient Prescriptions  Medication Sig Dispense Refill  . ARNUITY ELLIPTA 100 MCG/ACT AEPB     . fexofenadine (ALLEGRA) 180 MG tablet Take 180 mg by mouth daily.    . fluticasone (FLONASE) 50 MCG/ACT nasal spray 2 sprays by Nasal route daily.      . montelukast (SINGULAIR) 10 MG tablet TAKE 1 TABLET IN THE EVENING ONCE A DAY  5  . Olopatadine HCl (PATANASE) 0.6 % SOLN by Nasal route. 2 sprays to each nostril daily      No current facility-administered medications for this visit.     Objective: BP 108/72   Pulse 78   Temp 98.4 F (36.9 C) (Oral)   Ht 6' 1.75" (1.873 m)   Wt 193 lb 3.2 oz (87.6 kg)   SpO2 95%   BMI 24.97 kg/m  Gen: NAD, resting comfortably CV: RRR no murmurs rubs or gallops Lungs: CTAB no crackles, wheeze, rhonchi Abdomen: soft/nontender/nondistended/normal bowel sounds. No rebound or guarding.  Ext: no edema Skin: warm, dry  Right Knee: Normal to inspection with no erythema or effusion or obvious bony abnormalities. Palpation normal with no warmth or joint line tenderness or patellar tenderness or condyle tenderness. Patient does have some pain in his medial quadricep ROM normal in flexion and extension and lower leg rotation. Ligaments with solid consistent endpoints including ACL, PCL, LCL, MCL. Negative Mcmurray's. Non painful patellar compression. Patellar and quadriceps tendons  unremarkable. Hamstring and quadriceps strength is normal.  Assessment/Plan:  Right knee pain S: wears knee sleeve when runs or does lifting. Continues to have pain with these activities. Over top of patella some up to base of hamstring. Been going on since college- comes and goes. If physically active tends to bother him- does not go up in squat weight for example because having pain. Poor flexibility in legs per PT in college- hasn't seen them since that time. Some history of SI joint inflammation. Cannot get down on his right knee- cant crawl with kids. Does not really have issues with lleg curls.  A/P: Only obvious source of pain is with palpation of medial quadricep. I think patient could benefit from sports medicine evaluation with ultrasound of this area and the knee potentially.   Loose stools S: If eats something really high in fat- usually will have loose stools but also can happen randomly. No nausea or vomiting. Once a week happens otherwise regular daily stools. Does not link to stress. No blood in stool, melena. Since teenage years- does have abdominal pain at times resolved by the stool A/P: LIkely IBS D discussed symptomatic care. He has triggers including high fat. Also does better with yogurt- discussed activia potentially   Return in about 1 year (around 10/10/2016) for physical.  Orders Placed This Encounter  Procedures  . Ambulatory referral to Sports Medicine    Referral Priority:   Routine    Referral Type:  Consultation    Referred to Provider:   Antoine PrimasZachary Smith, MD    Number of Visits Requested:   1   Return precautions advised.  Tana ConchStephen Hunter, MD        .

## 2015-10-11 NOTE — Progress Notes (Signed)
Pre visit review using our clinic review tool, if applicable. No additional management support is needed unless otherwise documented below in the visit note. 

## 2015-10-11 NOTE — Assessment & Plan Note (Signed)
S: If eats something really high in fat- usually will have loose stools but also can happen randomly. No nausea or vomiting. Once a week happens otherwise regular daily stools. Does not link to stress. No blood in stool, melena. Since teenage years- does have abdominal pain at times resolved by the stool A/P: LIkely IBS D discussed symptomatic care. He has triggers including high fat. Also does better with yogurt- discussed activia potentially

## 2015-10-11 NOTE — Patient Instructions (Signed)
We will call you within a week about your referral to Dr. Katrinka BlazingSmith. If you do not hear within 2 weeks, give us a call.

## 2015-10-23 ENCOUNTER — Ambulatory Visit: Payer: Self-pay | Admitting: Family Medicine

## 2015-10-24 NOTE — Progress Notes (Deleted)
[name] Deontay Ladnier D.O. [facility] [address] [place], [place] [address] Phone: [phone] Subjective:    I'm seeing this patient by the request  of:  Tana ConchStephen Hunter, MD   CC: Right knee pain  AOZ:[id number]:Subjective  Tony [name] is a 35 y.o. male coming in with complaint of right knee pain  patient does like to work out on a regular basis. Has found that though he has had significant difficulty with increasing weight or certain activities in the gym secondary to pain in the knee. Patient states anything more than regular daily activities he started having more pain. Patient did see a physical therapist in [employer] and was told that he had some trouble. Has had a history of sacroiliac inflammation he states. Patient states that he gets down his right knee has worsening pain. Saw primary care provider and was found to have more pain in the medial quadriceps muscle.     Past Medical History:  Diagnosis Date  . ALLERGIC RHINITIS 09/17/2009  . ASTHMA 09/17/2009  . HYPERLIPIDEMIA 09/17/2009   Past Surgical History:  Procedure Laterality Date  . NASAL SINUS SURGERY Right 12/04/2014   Dr. Jenne PaneBates  . SINUS EXPLORATION  Nov 2014    per Dr. Jenne PaneBates    Social History   Social History  . Marital status: Married    Spouse name: N/A  . Number of children: N/A  . Years of education: N/A   Social History Main Topics  . Smoking status: Never Smoker  . Smokeless tobacco: Never Used  . Alcohol use Yes     Comment: occ  . Drug use: No  . Sexual activity: Not on file   Other Topics Concern  . Not on file   Social History Narrative   Married. 2 children. [name] (May 2016). [name] (July 2013).    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 Healthy Mother   . Sinusitis Father     recurrent- sinus surgery multiple. rare fungal  infection 20 cases in world.   Marland Kitchen. CAD Father     age 33 first heart attack. never smoker  . Alcohol abuse Father   . Depression Father     abused pain pills in past  . Atrial fibrillation Father   . Kidney disease Father     lifestyle/treatment of sinus issues related  . Healthy Brother     Past medical history, social, surgical and family history all reviewed in electronic medical record.  No pertanent information unless stated regarding to the chief complaint.   Review of Systems: No headache, visual changes, nausea, vomiting, diarrhea, constipation, dizziness, abdominal pain, skin rash, fevers, chills, night sweats, weight loss, swollen lymph nodes, body aches, joint swelling, muscle aches, chest pain, shortness of breath, mood changes.   Objective  There were no vitals taken for this visit.  General: No apparent distress alert and oriented x3 mood and affect normal, dressed appropriately.  HEENT: Pupils equal, extraocular movements intact  Respiratory: Patient's speak in full sentences and does not appear short of breath  Cardiovascular: No lower extremity edema, non tender, no erythema  Skin: Warm dry intact with no signs of infection or rash on extremities or on axial skeleton.  Abdomen: Soft nontender  Neuro: Cranial  nerves II through XII are intact, neurovascularly intact in all extremities with 2+ DTRs and 2+ pulses.  Lymph: No lymphadenopathy of posterior or anterior cervical chain or axillae bilaterally.  Gait normal with good balance and coordination.  MSK:  Non tender with full range of motion and good stability and symmetric strength and tone of shoulders, elbows, wrist, hip, and ankles bilaterally.  Knee: Right Normal to inspection with no erythema or effusion or obvious bony abnormalities. Palpation normal with no warmth, joint line tenderness, patellar tenderness, or condyle tenderness. ROM full in flexion and extension and lower leg rotation. Ligaments with solid  consistent endpoints including ACL, PCL, LCL, MCL. Negative Mcmurray's, Apley's, and Thessalonian tests. Non painful patellar compression. Patellar glide without crepitus. Patellar and quadriceps tendons unremarkable. Hamstring and quadriceps strength is normal.  Contralateral knee unremarkable   MSK US performed of: *** This study was ordered, performed, and interpreted by Terrilee Files D.O.  Knee: All structures visualized. Anteromedial, anterolateral, posteromedial, and posterolateral menisci unremarkable without tearing, fraying, effusion, or displacement. Patellar Tendon unremarkable on long and transverse views without effusion. No abnormality of prepatellar bursa. LCL and MCL unremarkable on long and transverse views. No abnormality of origin of medial or lateral head of the gastrocnemius.  IMPRESSION:  NORMAL ULTRASONOGRAPHIC EXAMINATION OF THE KNEE.    Impression and Recommendations:     This case required medical decision making of moderate complexity.      Note: This dictation was prepared with Dragon dictation along with smaller phrase technology. Any transcriptional errors that result from this process are unintentional.

## 2015-10-25 ENCOUNTER — Ambulatory Visit: Payer: 59 | Admitting: Family Medicine

## 2015-11-05 NOTE — Progress Notes (Signed)
[name] Smith D.O. [facility] [address] [place], [place] [address] Phone: [phone] Subjective:    I'm seeing this patient by the request  of:  Tana ConchStephen Hunter, MD   CC: Right knee pain  BMW:[id number]:Subjective  Tony Coppericholas Romanoff is a 35 y.o. male coming in with complaint of right knee pain  patient does like to work out on a regular basis. Has found that though he has had significant difficulty with increasing weight or certain activities in the gym secondary to pain in the knee. Patient states anything more than regular daily activities he started having more pain. Patient did see a physical therapist in [employer] and was told that he had some trouble. Has had a history of sacroiliac inflammation he states. Patient states that he gets down his right knee has worsening pain. Saw primary care provider and was found to have more pain in the medial quadriceps muscle. Rates severity of pain as 4/10 at all times and worse with putting pressure on the knee.  Mild improvement with NSAID.   Mild pain of lower back, been told had SI joint problem.  Worse pain with sitting or leaning over.  Hurts with belt at work. No radiation of pain. No weakness of the lower legs. Patient states overall maybe worsening.  Rates severity 4/10 but annoying and would like it better.      Past Medical History:  Diagnosis Date  . ALLERGIC RHINITIS 09/17/2009  . ASTHMA 09/17/2009  . HYPERLIPIDEMIA 09/17/2009   Past Surgical History:  Procedure Laterality Date  . NASAL SINUS SURGERY Right 12/04/2014   Dr. Jenne PaneBates  . SINUS EXPLORATION  Nov 2014    per Dr. Jenne PaneBates    Social History   Social History  . Marital status: Married    Spouse name: N/A  . Number of children: N/A  . Years of education: N/A   Social History Main Topics  . Smoking status: Never Smoker  . Smokeless tobacco: Never Used  . Alcohol use Yes     Comment: occ  . Drug use: No  . Sexual activity: Not Asked   Other Topics Concern  . None     Social History Narrative   Married. 2 children. [name] (May 2016). [name] (July 2013).    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 Healthy Mother   . Sinusitis Father     recurrent- sinus surgery multiple. rare fungal infection 20 cases in world.   Marland Kitchen. CAD Father     age 33 first heart attack. never smoker  . Alcohol abuse Father   . Depression Father     abused pain pills in past  . Atrial fibrillation Father   . Kidney disease Father     lifestyle/treatment of sinus issues related  . Healthy Brother     Past medical history, social, surgical and family history all reviewed in electronic medical record.  No pertanent information unless stated regarding to the chief complaint.   Review of Systems: No headache, visual changes, nausea, vomiting, diarrhea, constipation, dizziness, abdominal pain, skin rash, fevers, chills, night sweats, weight loss, swollen lymph nodes, body aches, joint swelling, muscle aches, chest pain, shortness of breath, mood changes.   Objective  Blood pressure 114/78, pulse 71,  height 6\' 1"  (1.854 m), weight 197 lb (89.4 kg), SpO2 99 %.  General: No apparent distress alert and oriented x3 mood and affect normal, dressed appropriately.  HEENT: Pupils equal, extraocular movements intact  Respiratory: Patient's speak in full sentences and does not appear short of breath  Cardiovascular: No lower extremity edema, non tender, no erythema  Skin: Warm dry intact with no signs of infection or rash on extremities or on axial skeleton.  Abdomen: Soft nontender  Neuro: Cranial nerves II through XII are intact, neurovascularly intact in all extremities with 2+ DTRs and 2+ pulses.  Lymph: No lymphadenopathy of posterior or anterior cervical chain or axillae bilaterally.  Gait normal with good balance and  coordination.  MSK:  Non tender with full range of motion and good stability and symmetric strength and tone of shoulders, elbows, wrist, hip, and ankles bilaterally.  Knee: Right Normal to inspection with no erythema or effusion or obvious bony abnormalities. Mild discomfort over quad tendon.  ROM full in flexion and extension and lower leg rotation. Ligaments with solid consistent endpoints including ACL, PCL, LCL, MCL. Negative Mcmurray's, Apley's, and Thessalonian tests. Mild painful patellar compression. Patellar glide with mild crepitus. Patellar and quadriceps tendons unremarkable. Hamstring and quadriceps strength is normal.  Contralateral knee unremarkable   Back Exam:  Inspection: Unremarkable  Motion: Flexion 40 deg, Extension 30 deg, Side Bending to 35 deg bilaterally,  Rotation to 35 deg bilaterally  SLR laying: Negative  XSLR laying: Negative  Palpable tenderness: positive over left SI joint. Marland Kitchen. FABER: positive left . Sensory change: Gross sensation intact to all lumbar and sacral dermatomes.  Reflexes: 2+ at both patellar tendons, 2+ at achilles tendons, Babinski's downgoing.  Strength at foot  Plantar-flexion: 5/5 Dorsi-flexion: 5/5 Eversion: 5/5 Inversion: 5/5  Leg strength  Quad: 5/5 Hamstring: 5/5 Hip flexor: 5/5 Hip abductors: 5/5  Gait unremarkable.   MSK US performed of: right  This study was ordered, performed, and interpreted by Terrilee FilesZach Smith D.O.  Knee: All structures visualized. Anteromedial, anterolateral, posteromedial, and posterolateral menisci unremarkable without tearing, fraying, effusion, or displacement. Patellar Tendon unremarkable on long and transverse views without effusion. Quad tendon mild increase in Doppler flow. No true tearing appreciative. Patient does have what appears to be a nonhealing avulsion of the proximal patella No abnormality of prepatellar bursa. LCL and MCL unremarkable on long and transverse views. No abnormality of  origin of medial or lateral head of the gastrocnemius.  IMPRESSION:  Mild quadricep tendinitis with a nonhealing avulsion of the patella  Osteopathic findings T3 extended rotated and side bent right L2 flexed rotated and side bent right Sacrum right on right    Impression and Recommendations:     This case required medical decision making of moderate complexity.      Note: This dictation was prepared with Dragon dictation along with smaller phrase technology. Any transcriptional errors that result from this process are unintentional.

## 2015-11-06 ENCOUNTER — Ambulatory Visit (INDEPENDENT_AMBULATORY_CARE_PROVIDER_SITE_OTHER): Payer: 59 | Admitting: Family Medicine

## 2015-11-06 ENCOUNTER — Encounter: Payer: Self-pay | Admitting: *Deleted

## 2015-11-06 ENCOUNTER — Ambulatory Visit: Payer: Self-pay

## 2015-11-06 ENCOUNTER — Encounter: Payer: Self-pay | Admitting: Family Medicine

## 2015-11-06 VITALS — BP 114/78 | HR 71 | Ht 73.0 in | Wt 197.0 lb

## 2015-11-06 DIAGNOSIS — M76899 Other specified enthesopathies of unspecified lower limb, excluding foot: Secondary | ICD-10-CM

## 2015-11-06 DIAGNOSIS — M999 Biomechanical lesion, unspecified: Secondary | ICD-10-CM | POA: Insufficient documentation

## 2015-11-06 DIAGNOSIS — M533 Sacrococcygeal disorders, not elsewhere classified: Secondary | ICD-10-CM | POA: Diagnosis not present

## 2015-11-06 DIAGNOSIS — M25561 Pain in right knee: Secondary | ICD-10-CM | POA: Diagnosis not present

## 2015-11-06 MED ORDER — DICLOFENAC SODIUM 2 % TD SOLN: 2.0000 "application " | Freq: Two times a day (BID) | TRANSDERMAL | 3 refills | Status: DC

## 2015-11-06 MED ORDER — NITROGLYCERIN 0.2 MG/HR TD PT24: MEDICATED_PATCH | TRANSDERMAL | 1 refills | Status: DC

## 2015-11-06 MED ORDER — VITAMIN D (ERGOCALCIFEROL) 1.25 MG (50000 UNIT) PO CAPS: 50000.0000 [IU] | ORAL_CAPSULE | ORAL | 0 refills | Status: DC

## 2015-11-06 NOTE — Assessment & Plan Note (Signed)
Decision today to treat with OMT was based on Physical Exam  After verbal consent patient was treated with HVAL, ME techniques in thoracic, lumbar and sacral areas  Patient tolerated the procedure well with improvement in symptoms  Patient given exercises, stretches and lifestyle modifications  See medications in patient instructions if given  Patient will follow up in 3-4 weeks

## 2015-11-06 NOTE — Assessment & Plan Note (Signed)
Sacroiliac Joint Mobilization and Rehab 1. Work on pretzel stretching, shoulder back and leg draped in front. 3-5 sets, 30 sec.. 2. hip abductor rotations. standing, hip flexion and rotation outward then inward. 3 sets, 15 reps. when can do comfortably, add ankle weights starting at 2 pounds.  3. cross over stretching - shoulder back to ground, same side leg crossover. 3-5 sets for 30 min..  4. rolling up and back knees to chest and rocking. 5. sacral tilt - 5 sets, hold for 5-10 seconds RTC in 3-4 weeks.

## 2015-11-06 NOTE — Assessment & Plan Note (Signed)
Patient does have more of a nonhealing avulsion of the patella. I do believe that this is contributing to his discomfort. Patient will do once weekly vitamin D, nitroglycerin patches and warned of potential side effects. We discussed icing regimen, work with Event organiserathletic trainer to learn home exercises. Follow-up in 3-4 weeks and we'll ultrasound again.

## 2015-11-06 NOTE — Patient Instructions (Signed)
Good to see you.  Ice 20 minutes 2 times daily. Usually after activity and before bed. Exercises 3 times a week.  pennsaid pinkie amount topically 2 times daily as needed.  One weekly vitamin D Patella strap but wear it over the quad tendon.  Nitroglycerin Protocol   Apply 1/4 nitroglycerin patch to affected area daily.  Change position of patch within the affected area every 24 hours.  You may experience a headache during the first 1-2 weeks of using the patch, these should subside.  If you experience headaches after beginning nitroglycerin patch treatment, you may take your preferred over the counter pain reliever.  Another side effect of the nitroglycerin patch is skin irritation or rash related to patch adhesive.  Please notify our office if you develop more severe headaches or rash, and stop the patch.  Tendon healing with nitroglycerin patch may require 12 to 24 weeks depending on the extent of injury.  Men should not use if taking Viagra, Cialis, or Levitra.   Do not use if you have migraines or rosacea.  See me again in 3-4 weeks

## 2015-11-19 ENCOUNTER — Encounter: Payer: Self-pay | Admitting: Family Medicine

## 2015-11-19 ENCOUNTER — Ambulatory Visit (INDEPENDENT_AMBULATORY_CARE_PROVIDER_SITE_OTHER): Payer: 59 | Admitting: Family Medicine

## 2015-11-19 VITALS — BP 112/68 | HR 97 | Temp 98.1°F | Ht 73.0 in | Wt 199.0 lb

## 2015-11-19 DIAGNOSIS — J0191 Acute recurrent sinusitis, unspecified: Secondary | ICD-10-CM | POA: Diagnosis not present

## 2015-11-19 MED ORDER — LEVOFLOXACIN 500 MG PO TABS: 500.0000 mg | ORAL_TABLET | Freq: Every day | ORAL | 0 refills | Status: DC

## 2015-11-19 NOTE — Patient Instructions (Signed)

## 2015-11-19 NOTE — Progress Notes (Signed)
Subjective:     Patient ID: Tony [name], male   DOB: 04/21/1980, 35 y.o.   MRN: [id number]  HPI Patient has a long history of recurrent sinus infections. He is seen with several day history of increased sinus pressure specially maxillary region with increased upper teeth pain. He had some bilateral ear fullness. He's taken Sudafed and saline irrigation without relief. No fever. He's had 2 previous sinus surgeries. Has had some greenish nasal discharge past couple days. He thinks  allergies and dry heat tend to trigger his sinusitis. Increased malaise. Occasional headaches.  Past Medical History:  Diagnosis Date  . ALLERGIC RHINITIS 09/17/2009  . ASTHMA 09/17/2009  . HYPERLIPIDEMIA 09/17/2009   Past Surgical History:  Procedure Laterality Date  . NASAL SINUS SURGERY Right 12/04/2014   Dr. Jenne PaneBates  . SINUS EXPLORATION  Nov 2014    per Dr. Jenne PaneBates     reports that he has never smoked. He has never used smokeless tobacco. He reports that he drinks alcohol. He reports that he does not use drugs. family history includes Alcohol abuse in his father; Atrial fibrillation in his father; CAD in his father; Depression in his father; Healthy in his brother and mother; Kidney disease in his father; Sinusitis in his father. No Known Allergies   Review of Systems  Constitutional: Positive for fatigue. Negative for chills and fever.  HENT: Positive for congestion, sinus pain and sinus pressure.   Respiratory: Positive for cough.        Objective:   Physical Exam  Constitutional: He appears well-developed and well-nourished.  HENT:  Right Ear: External ear normal.  Left Ear: External ear normal.  Mouth/Throat: Oropharynx is clear and moist.  Erythematous nasal mucosa, otherwise clear  Neck: Neck supple.  Cardiovascular: Normal rate and regular rhythm.   Pulmonary/Chest: Effort normal and breath sounds normal. No respiratory distress. He has no wheezes. He has no rales.  Lymphadenopathy:    He  has no cervical adenopathy.       Assessment:     Acute sinusitis in a patient with prior history of multiple sinus surgeries.  He states he has been resistant to improvement with Augmentin previously and has frequently required Levaquin    Plan:     -Levaquin 500 milligrams once daily for 10 days -Continue saline nasal irrigation and good hydration -Follow-up with primary in 2 weeks if not improved  Kristian CoveyBruce W Ahsley Attwood MD [facility]

## 2015-11-26 NOTE — Progress Notes (Signed)
Tony ScaleZach [name] D.O. [facility] [address] [place], [place] [address] Phone: [phone] Subjective:    I'm seeing this patient by the request  of:  Tana ConchStephen Hunter, MD   CC: Right knee pain and back pain f/u  BJY:[id number]:Subjective  Tony [name] is a 35 y.o. male coming in with complaint of right knee pain  patient does like to work out on a regular basis. Patient was seen previously and did have more of a quadricep tendinitis with a small avulsion fracture of the patella. Patient was to do once weekly vitamin D as well as started the nitroglycerin. Patient states Approximate 60% better. Not as much pain. Exercises are going well. Denies any numbness or tingling. Denies any weakness. Feels he is making progress. Has not had any of the sharp pains he is having previously.  Mild pain of lower back, been told had SI joint problem.  Done have some sacroiliac dysfunction. Was to do home exercise, icing protocol, states that the core exercises seemed to be helpful when he is doing a. 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     Past Medical History:  Diagnosis Date  . ALLERGIC RHINITIS 09/17/2009  . ASTHMA 09/17/2009  . HYPERLIPIDEMIA 09/17/2009   Past Surgical History:  Procedure Laterality Date  . NASAL SINUS SURGERY Right 12/04/2014   Dr. Jenne PaneBates  . SINUS EXPLORATION  Nov 2014    per Dr. Jenne PaneBates    Social History   Social History  . Marital status: Married    Spouse name: N/A  . Number of children: N/A  . Years of education: N/A   Social History Main Topics  . Smoking status: Never Smoker  . Smokeless tobacco: Never Used  . Alcohol use Yes     Comment: occ  . Drug use: No  . Sexual activity: Not Asked   Other Topics Concern  . None   Social History Narrative   Married. 2 children. Tony [name] (May 2016). Tony [name] (July 2013).    Wife works as Emergency planning/management officerCPA      Police Officer since 2012      Hobbies: time with sons, going to gym- [employer] 7 days a week   [employer]   No Known Allergies Family History  Problem Relation Age of Onset  . Healthy Mother   . Sinusitis Father     recurrent- sinus surgery multiple. rare fungal infection 20 cases in world.   Tony [name]. CAD Father     age 33 first heart attack. never smoker  . Alcohol abuse Father   . Depression Father     abused pain pills in past  . Atrial fibrillation Father   . Kidney disease Father     lifestyle/treatment of sinus issues related  . Healthy Brother     Past medical history, social, surgical and family history all reviewed in electronic medical record.  No pertanent information unless stated regarding to the chief complaint.   Review of Systems: No headache, visual changes, nausea, vomiting, diarrhea, constipation, dizziness, abdominal pain, skin rash, fevers, chills, night sweats, weight loss, swollen lymph nodes, body aches, joint swelling, muscle aches, chest pain, shortness of breath, mood changes.   Objective  Blood pressure 128/82, pulse 82, height 6\' 1"  (1.854 m), weight 201 lb (91.2 kg), SpO2 98 %.  Systems examined below as of 11/27/15 General: NAD A&O x3 mood, affect normal  HEENT: Pupils equal, extraocular movements intact no nystagmus Respiratory: not short of  breath at rest or with speaking Cardiovascular: No lower extremity edema, non tender Skin: Warm dry intact with no signs of infection or rash on extremities or on axial skeleton. Abdomen: Soft nontender, no masses Neuro: Cranial nerves  intact, neurovascularly intact in all extremities with 2+ DTRs and 2+ pulses. Lymph: No lymphadenopathy appreciated today  Gait normal with good balance and coordination.  MSK: Non tender with full range of motion and good stability and symmetric strength and tone of shoulders, elbows, wrist,  hips and ankles bilaterally.   Knee: Right Normal to inspection with no erythema or effusion or obvious bony abnormalities. Nontender on exam today ROM full in flexion and extension  and lower leg rotation. Ligaments with solid consistent endpoints including ACL, PCL, LCL, MCL. Negative Mcmurray's, Apley's, and Thessalonian tests. Non-painful patellar compression. Patellar glide with minimal crepitus. Patellar and quadriceps tendons unremarkable. Hamstring and quadriceps strength is normal.  Contralateral knee unremarkable  Improvement from previous exam  Back Exam:  Inspection: Unremarkable  Motion: Flexion 40 deg, Extension 30 deg, Side Bending to 35 deg bilaterally,  Rotation to 35 deg bilaterally  SLR laying: Negative  XSLR laying: Negative  Palpable tenderness: Mild improvement and continued tenderness over the left sacroiliac joint. FABER: positive left . Sensory change: Gross sensation intact to all lumbar and sacral dermatomes.  Reflexes: 2+ at both patellar tendons, 2+ at achilles tendons, Babinski's downgoing.  Strength at foot  Plantar-flexion: 5/5 Dorsi-flexion: 5/5 Eversion: 5/5 Inversion: 5/5  Leg strength  Quad: 5/5 Hamstring: 5/5 Hip flexor: 5/5 Hip abductors: 5/5  Gait unremarkable.   MSK US performed of: right  This study was ordered, performed, and interpreted by Terrilee FilesZach [name] D.O.  Knee: All structures visualized. Anteromedial, anterolateral, posteromedial, and posterolateral menisci unremarkable without tearing, fraying, effusion, or displacement. Patellar Tendon unremarkable on long and transverse views without effusion. Avulsion there was noted previously has been healed. Patient also decreased Doppler flow and hypoechoic changes within the quadricep tendon No abnormality of prepatellar bursa. LCL and MCL unremarkable on long and transverse views. No abnormality of origin of medial or lateral head of the gastrocnemius.  IMPRESSION:  Healing avulsion of the proximal patella with improvement quadricep tendinitis  Osteopathic findings T3 extended rotated and side bent right L2 flexed rotated and side bent right L4 flexed rotated and  side bent left Sacrum right on right    Impression and Recommendations:     This case required medical decision making of moderate complexity.      Note: This dictation was prepared with Dragon dictation along with smaller phrase technology. Any transcriptional errors that result from this process are unintentional.

## 2015-11-27 ENCOUNTER — Ambulatory Visit (INDEPENDENT_AMBULATORY_CARE_PROVIDER_SITE_OTHER): Payer: 59 | Admitting: Family Medicine

## 2015-11-27 ENCOUNTER — Ambulatory Visit: Payer: Self-pay

## 2015-11-27 ENCOUNTER — Encounter: Payer: Self-pay | Admitting: Family Medicine

## 2015-11-27 VITALS — BP 128/82 | HR 82 | Ht 73.0 in | Wt 201.0 lb

## 2015-11-27 DIAGNOSIS — M999 Biomechanical lesion, unspecified: Secondary | ICD-10-CM | POA: Diagnosis not present

## 2015-11-27 DIAGNOSIS — M25561 Pain in right knee: Secondary | ICD-10-CM

## 2015-11-27 DIAGNOSIS — M533 Sacrococcygeal disorders, not elsewhere classified: Secondary | ICD-10-CM | POA: Diagnosis not present

## 2015-11-27 DIAGNOSIS — M76899 Other specified enthesopathies of unspecified lower limb, excluding foot: Secondary | ICD-10-CM

## 2015-11-27 NOTE — Patient Instructions (Signed)
Good to see you  Ice is your friend Try a band aid on the nitro patch.  Start to do a light jog and incorporate legs but no sprinting or jumping.  The back is better so keep trucking along See me again in 6 weeks to make sure you are doing well.

## 2015-11-27 NOTE — Assessment & Plan Note (Signed)
Improved at this time. Encourage him to continue the conservative therapy. No significant change in management.

## 2015-11-27 NOTE — Assessment & Plan Note (Signed)
Encourage focus on core strengthening, hip flexor stretching, and hip abductor strengthening. Responded well to osteopathic manipulation. No significant change in medical management anyhow.

## 2015-11-27 NOTE — Assessment & Plan Note (Signed)
Decision today to treat with OMT was based on Physical Exam  After verbal consent patient was treated with HVAL, ME techniques in thoracic, lumbar and sacral areas  Patient tolerated the procedure well with improvement in symptoms  Patient given exercises, stretches and lifestyle modifications  See medications in patient instructions if given  Patient will follow up in 4-6 weeks

## 2016-01-08 NOTE — Progress Notes (Signed)
Tony [name] D.O. [facility] [address] [place], [place] [address] Phone: [phone]) [phone] Subjective:    I'm seeing this patient by the request  of:  Tana ConchStephen Hunter, MD   CC: Right knee pain and back pain f/u  YQM:[id number]:Subjective  Tony [name] is a 36 y.o. male coming in with complaint of right knee pain  patient does like to work out on a regular basis. Patient was seen previously and did have more of a quadricep tendinitis with a small avulsion fracture of the patella. Patient was to do once weekly vitamin D as well as started the nitroglycerin. Patient states Approximate 60% better. Not as much pain. Exercises are going well. Denies any numbness or tingling. Patient was to start decreasing the nitroglycerin. Patient has discontinued the meniscus and at this time. States that he has been started to increase his activity. Very minimal discomfort but nothing severe at this time.  Mild pain of lower back, been told had SI joint problem.  Done have more of a sacroiliac dysfunction. Doing relatively well. Continues to make improvement. Still has times when he is exacerbation of pain but no radiation. No numbness. No nighttime awakening. Is able to the activities and has been working out on a more regular basis as well.     Past Medical History:  Diagnosis Date  . ALLERGIC RHINITIS 09/17/2009  . ASTHMA 09/17/2009  . HYPERLIPIDEMIA 09/17/2009   Past Surgical History:  Procedure Laterality Date  . NASAL SINUS SURGERY Right 12/04/2014   Dr. Jenne PaneBates  . SINUS EXPLORATION  Nov 2014    per Dr. Jenne PaneBates    Social History   Social History  . Marital status: Married    Spouse name: N/A  . Number of children: N/A  . Years of education: N/A   Social History Main Topics  . Smoking status: Never Smoker  . Smokeless tobacco: Never Used  . Alcohol use Yes     Comment: occ  . Drug use: No  . Sexual activity: Not Asked   Other Topics Concern  . None   Social History Narrative   Married. 2 children. [name] (May 2016). [name] (July 2013).    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 Healthy Mother   . Sinusitis Father     recurrent- sinus surgery multiple. rare fungal infection 20 cases in world.   Marland Kitchen. CAD Father     age 34 first heart attack. never smoker  . Alcohol abuse Father   . Depression Father     abused pain pills in past  . Atrial fibrillation Father   . Kidney disease Father     lifestyle/treatment of sinus issues related  . Healthy Brother     Past medical history, social, surgical and family history all reviewed in electronic medical record.  No pertanent information unless stated regarding to the chief complaint.   Review of Systems: No headache, visual changes, nausea, vomiting, diarrhea, constipation, dizziness, abdominal pain, skin rash, fevers, chills, night sweats, weight loss, swollen lymph nodes, body aches, joint swelling, muscle aches, chest pain, shortness of breath, mood changes.    Objective  Blood pressure 122/80, pulse 65, height 6\' 1"  (1.854 m), weight 205 lb (93 kg), SpO2 98 %.  Systems  examined below as of 01/09/16 General: NAD A&O x3 mood, affect normal  HEENT: Pupils equal, extraocular movements intact no nystagmus Respiratory: not short of breath at rest or with speaking Cardiovascular: No lower extremity edema, non tender Skin: Warm dry intact with no signs of infection or rash on extremities or on axial skeleton. Abdomen: Soft nontender, no masses Neuro: Cranial nerves  intact, neurovascularly intact in all extremities with 2+ DTRs and 2+ pulses. Lymph: No lymphadenopathy appreciated today  Gait normal with good balance and coordination.  MSK: Non tender with full range of motion and good stability and symmetric strength and tone of shoulders, elbows, wrist,   knee hips and ankles bilaterally.    Knee: Right Normal to inspection with no erythema or effusion or obvious bony abnormalities. Palpation normal with no warmth, joint line tenderness, patellar tenderness, or condyle tenderness. ROM full in flexion and extension and lower leg rotation. Ligaments with solid consistent endpoints including ACL, PCL, LCL, MCL. Negative Mcmurray's, Apley's, and Thessalonian tests. Non painful patellar compression. Patellar glide without crepitus. Patellar and quadriceps tendons unremarkable. Hamstring and quadriceps strength is normal.    Back Exam:  Inspection: Unremarkable  Motion: Flexion 40 deg, Extension 30 deg, Side Bending to 35 deg bilaterally,  Rotation to 35 deg bilaterally  SLR laying: Negative  XSLR laying: Negative  Palpable tenderness: Very mild discomfort of the left sacroiliac joint FABER: positive left  still remaining Sensory change: Gross sensation intact to all lumbar and sacral dermatomes.  Reflexes: 2+ at both patellar tendons, 2+ at achilles tendons, Babinski's downgoing.  Strength at foot  Plantar-flexion: 5/5 Dorsi-flexion: 5/5 Eversion: 5/5 Inversion: 5/5  Leg strength  Quad: 5/5 Hamstring: 5/5 Hip flexor: 5/5 Hip abductors: 5/5  Gait unremarkable. Stable from previous exam    Osteopathic findings Cervical C4 flexed rotated and side bent left C6 flexed rotated and side bent left T3 extended rotated and side bent right inhaled third rib T6 extended rotated and side bent left L2 flexed rotated and side bent right Sacrum right on right     Impression and Recommendations:     This case required medical decision making of moderate complexity.      Note: This dictation was prepared with Dragon dictation along with smaller phrase technology. Any transcriptional errors that result from this process are unintentional.

## 2016-01-09 ENCOUNTER — Encounter: Payer: Self-pay | Admitting: Family Medicine

## 2016-01-09 ENCOUNTER — Ambulatory Visit (INDEPENDENT_AMBULATORY_CARE_PROVIDER_SITE_OTHER): Payer: 59 | Admitting: Family Medicine

## 2016-01-09 VITALS — BP 122/80 | HR 65 | Ht 73.0 in | Wt 205.0 lb

## 2016-01-09 DIAGNOSIS — M533 Sacrococcygeal disorders, not elsewhere classified: Secondary | ICD-10-CM | POA: Diagnosis not present

## 2016-01-09 DIAGNOSIS — M999 Biomechanical lesion, unspecified: Secondary | ICD-10-CM | POA: Diagnosis not present

## 2016-01-09 DIAGNOSIS — M76899 Other specified enthesopathies of unspecified lower limb, excluding foot: Secondary | ICD-10-CM

## 2016-01-09 NOTE — Assessment & Plan Note (Signed)
Doing much better at this time. No significant change in management. Encourage him to continue to work on core strengthening and hip abductor strengthening. Patient will see me again in 2 months.

## 2016-01-09 NOTE — Assessment & Plan Note (Signed)
Decision today to treat with OMT was based on Physical Exam  After verbal consent patient was treated with HVAL, ME techniques in thoracic, lumbar and sacral areas  Patient tolerated the procedure well with improvement in symptoms  Patient given exercises, stretches and lifestyle modifications  See medications in patient instructions if given  Patient will follow up in 8 weeks

## 2016-01-09 NOTE — Assessment & Plan Note (Signed)
Doing well overall. Continue with conservative management.

## 2016-01-09 NOTE — Patient Instructions (Signed)
2 months

## 2016-01-10 DIAGNOSIS — J3089 Other allergic rhinitis: Secondary | ICD-10-CM | POA: Diagnosis not present

## 2016-01-10 DIAGNOSIS — J3081 Allergic rhinitis due to animal (cat) (dog) hair and dander: Secondary | ICD-10-CM | POA: Diagnosis not present

## 2016-01-10 DIAGNOSIS — J301 Allergic rhinitis due to pollen: Secondary | ICD-10-CM | POA: Diagnosis not present

## 2016-01-16 DIAGNOSIS — J208 Acute bronchitis due to other specified organisms: Secondary | ICD-10-CM | POA: Diagnosis not present

## 2016-01-16 DIAGNOSIS — R69 Illness, unspecified: Secondary | ICD-10-CM | POA: Diagnosis not present

## 2016-01-16 DIAGNOSIS — R509 Fever, unspecified: Secondary | ICD-10-CM | POA: Diagnosis not present

## 2016-01-18 DIAGNOSIS — J301 Allergic rhinitis due to pollen: Secondary | ICD-10-CM | POA: Diagnosis not present

## 2016-01-18 DIAGNOSIS — J3089 Other allergic rhinitis: Secondary | ICD-10-CM | POA: Diagnosis not present

## 2016-01-18 DIAGNOSIS — J3081 Allergic rhinitis due to animal (cat) (dog) hair and dander: Secondary | ICD-10-CM | POA: Diagnosis not present

## 2016-01-29 DIAGNOSIS — J301 Allergic rhinitis due to pollen: Secondary | ICD-10-CM | POA: Diagnosis not present

## 2016-01-29 DIAGNOSIS — J3081 Allergic rhinitis due to animal (cat) (dog) hair and dander: Secondary | ICD-10-CM | POA: Diagnosis not present

## 2016-01-29 DIAGNOSIS — J3089 Other allergic rhinitis: Secondary | ICD-10-CM | POA: Diagnosis not present

## 2016-01-31 DIAGNOSIS — J3089 Other allergic rhinitis: Secondary | ICD-10-CM | POA: Diagnosis not present

## 2016-01-31 DIAGNOSIS — J3081 Allergic rhinitis due to animal (cat) (dog) hair and dander: Secondary | ICD-10-CM | POA: Diagnosis not present

## 2016-01-31 DIAGNOSIS — J301 Allergic rhinitis due to pollen: Secondary | ICD-10-CM | POA: Diagnosis not present

## 2016-02-04 DIAGNOSIS — J3089 Other allergic rhinitis: Secondary | ICD-10-CM | POA: Diagnosis not present

## 2016-02-04 DIAGNOSIS — J301 Allergic rhinitis due to pollen: Secondary | ICD-10-CM | POA: Diagnosis not present

## 2016-02-04 DIAGNOSIS — J3081 Allergic rhinitis due to animal (cat) (dog) hair and dander: Secondary | ICD-10-CM | POA: Diagnosis not present

## 2016-02-06 DIAGNOSIS — J3089 Other allergic rhinitis: Secondary | ICD-10-CM | POA: Diagnosis not present

## 2016-02-06 DIAGNOSIS — J3081 Allergic rhinitis due to animal (cat) (dog) hair and dander: Secondary | ICD-10-CM | POA: Diagnosis not present

## 2016-02-06 DIAGNOSIS — J301 Allergic rhinitis due to pollen: Secondary | ICD-10-CM | POA: Diagnosis not present

## 2016-02-06 IMAGING — CR DG CHEST 2V
2 series · 2 of 2 positions shown · non-contrast
Comparison: 04/18/2010, 11/07/2005

CLINICAL DATA: 33-year-old male with a history of cough

EXAM:
CHEST - 2 VIEW

[view not recorded (1 of 2)]
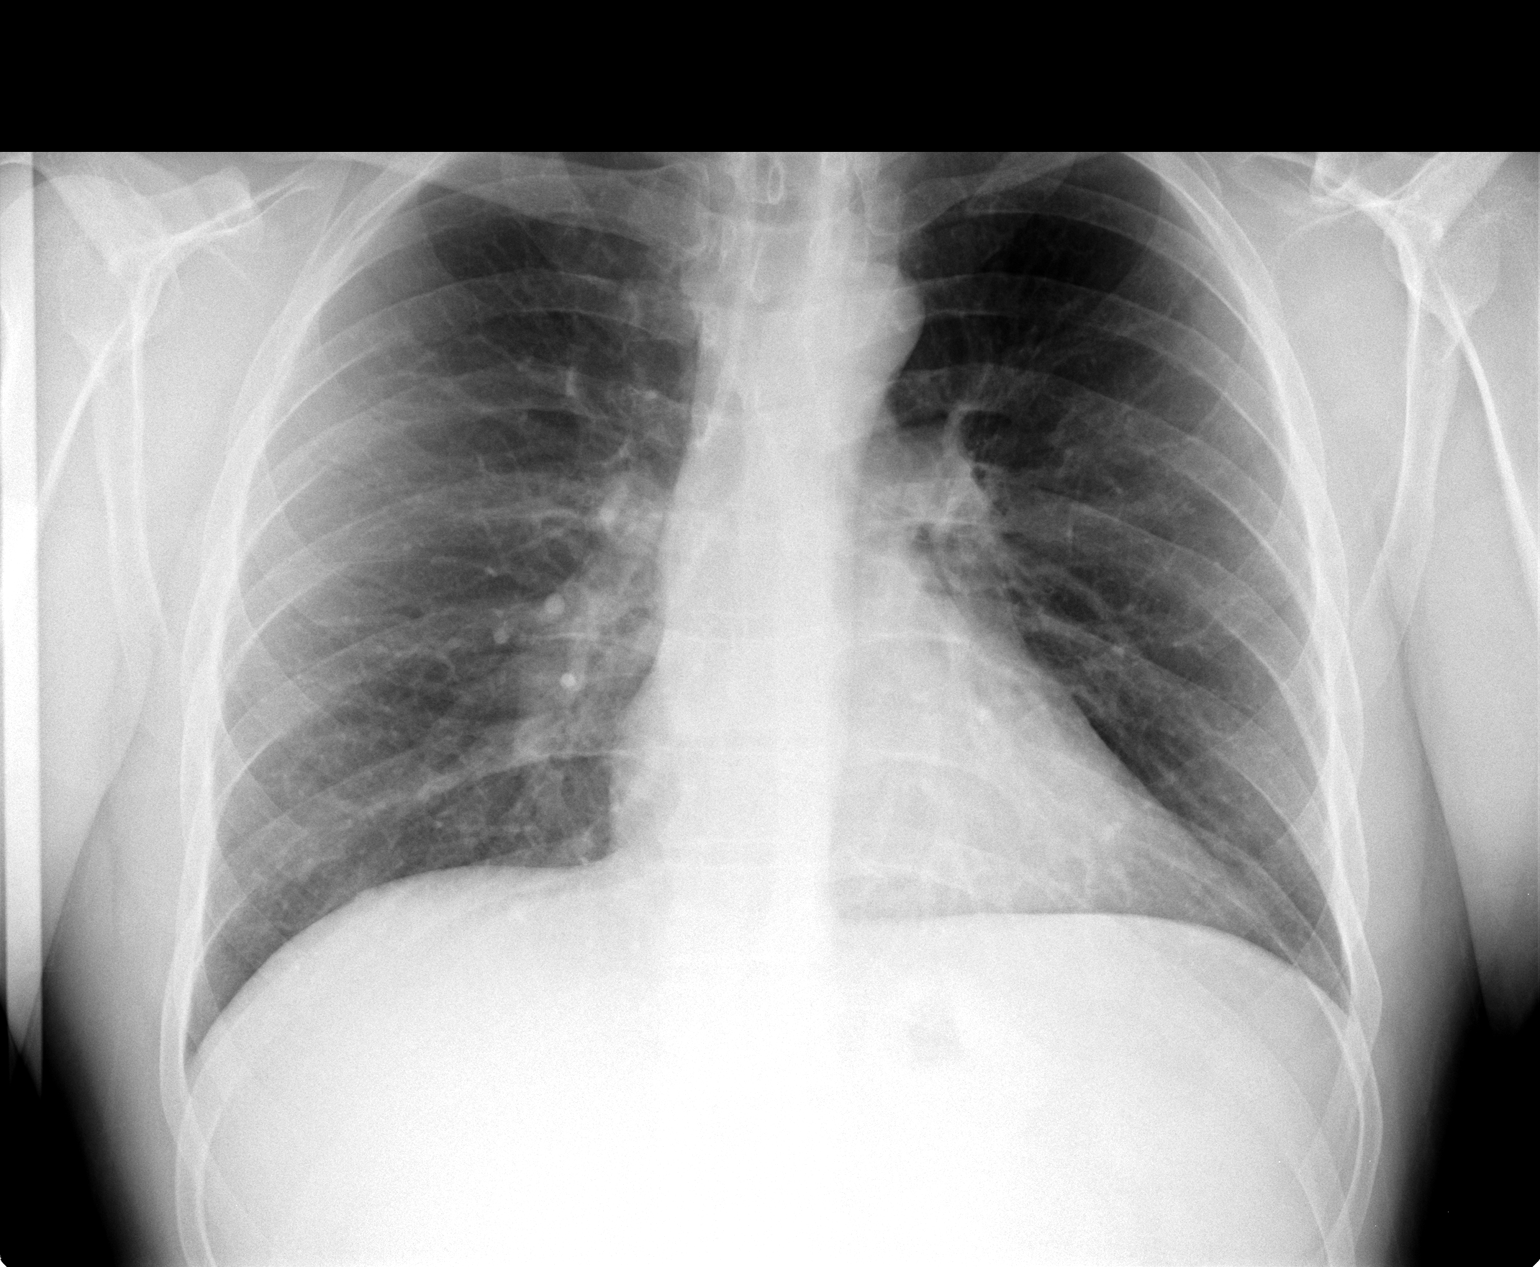

[view not recorded (2 of 2)]
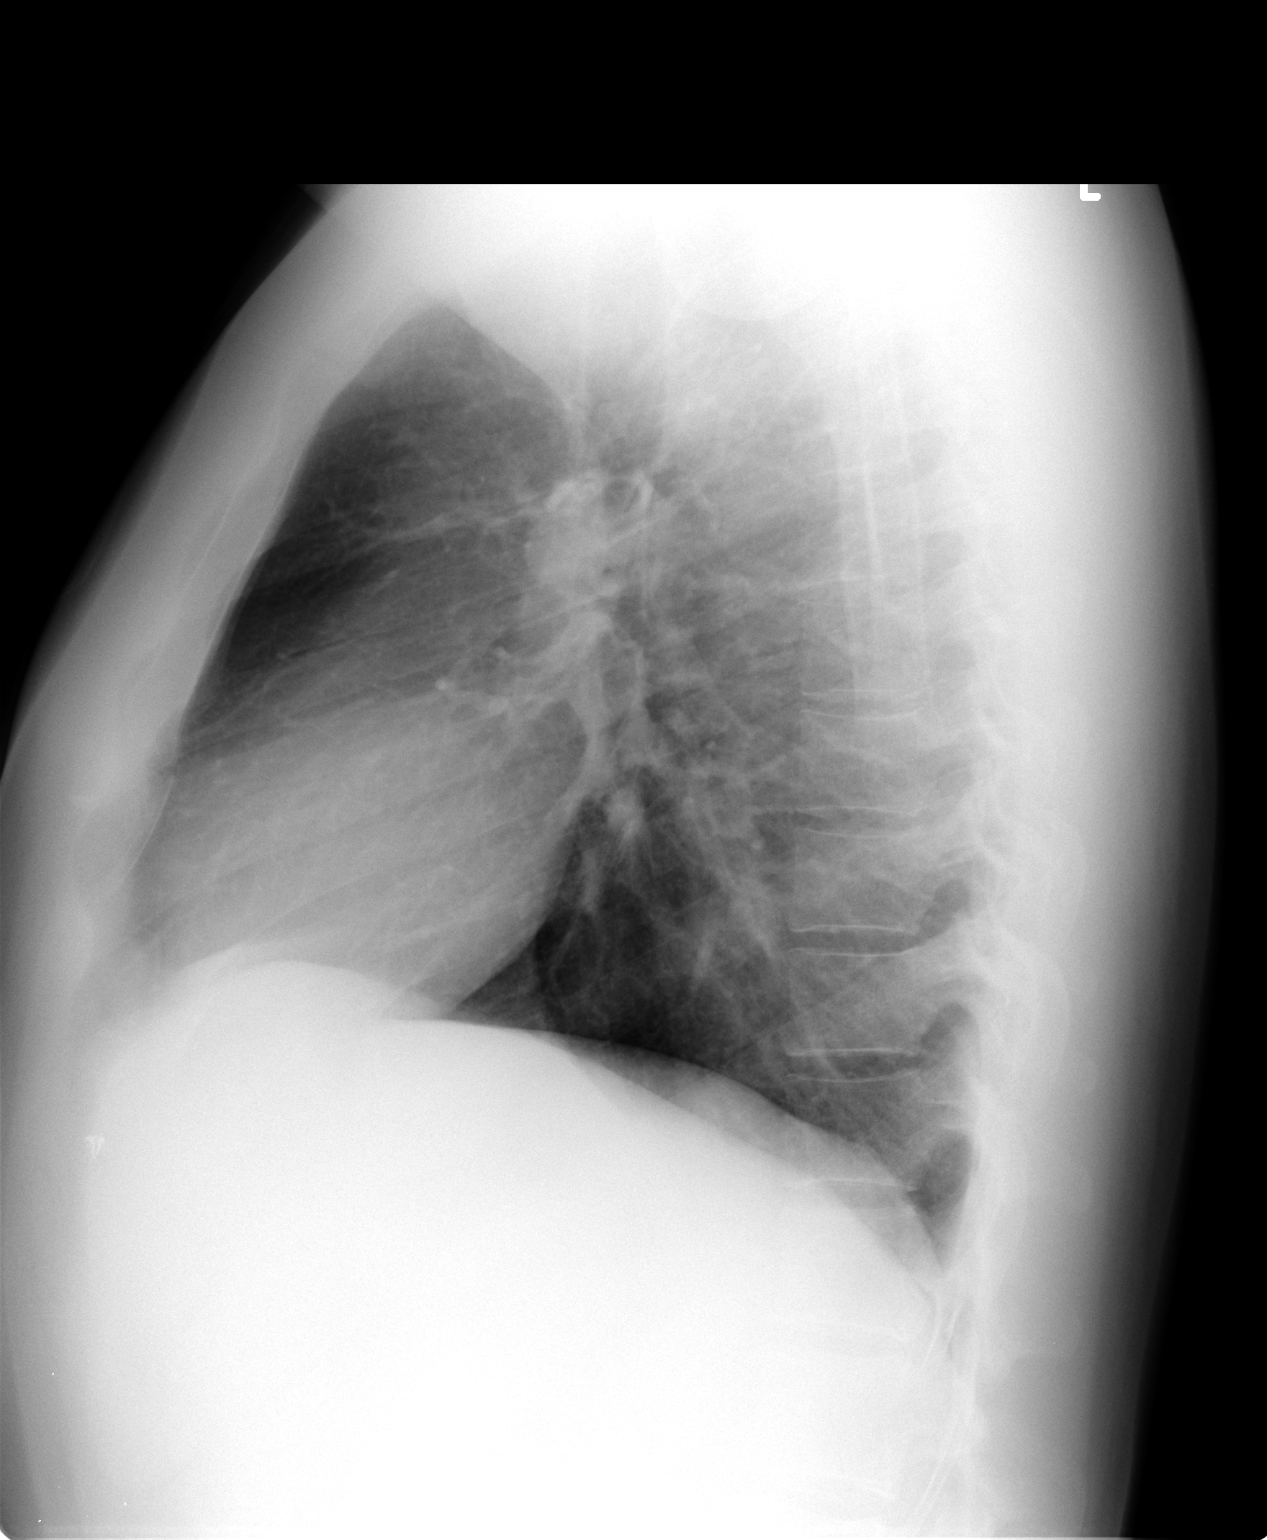

[2 of 2 positions shown; findings below may reference images not displayed]

FINDINGS: Cardiomediastinal silhouette projects within normal limits in size
and contour.

Lung volumes are decreased compared to the prior accentuating the
interstitium.

No confluent airspace disease, pneumothorax, or pleural effusion.

No displaced fracture.

Unremarkable appearance of the upper abdomen.
IMPRESSION: No radiographic evidence of acute cardiopulmonary disease, with
similar appearance to the comparison plain films.

## 2016-02-12 DIAGNOSIS — J3081 Allergic rhinitis due to animal (cat) (dog) hair and dander: Secondary | ICD-10-CM | POA: Diagnosis not present

## 2016-02-12 DIAGNOSIS — J3089 Other allergic rhinitis: Secondary | ICD-10-CM | POA: Diagnosis not present

## 2016-02-12 DIAGNOSIS — J301 Allergic rhinitis due to pollen: Secondary | ICD-10-CM | POA: Diagnosis not present

## 2016-02-18 DIAGNOSIS — J3081 Allergic rhinitis due to animal (cat) (dog) hair and dander: Secondary | ICD-10-CM | POA: Diagnosis not present

## 2016-02-18 DIAGNOSIS — J3089 Other allergic rhinitis: Secondary | ICD-10-CM | POA: Diagnosis not present

## 2016-02-18 DIAGNOSIS — J301 Allergic rhinitis due to pollen: Secondary | ICD-10-CM | POA: Diagnosis not present

## 2016-02-22 DIAGNOSIS — J3081 Allergic rhinitis due to animal (cat) (dog) hair and dander: Secondary | ICD-10-CM | POA: Diagnosis not present

## 2016-02-22 DIAGNOSIS — J301 Allergic rhinitis due to pollen: Secondary | ICD-10-CM | POA: Diagnosis not present

## 2016-02-22 DIAGNOSIS — J3089 Other allergic rhinitis: Secondary | ICD-10-CM | POA: Diagnosis not present

## 2016-02-25 DIAGNOSIS — J301 Allergic rhinitis due to pollen: Secondary | ICD-10-CM | POA: Diagnosis not present

## 2016-02-25 DIAGNOSIS — J3081 Allergic rhinitis due to animal (cat) (dog) hair and dander: Secondary | ICD-10-CM | POA: Diagnosis not present

## 2016-02-25 DIAGNOSIS — J3089 Other allergic rhinitis: Secondary | ICD-10-CM | POA: Diagnosis not present

## 2016-02-27 DIAGNOSIS — J3081 Allergic rhinitis due to animal (cat) (dog) hair and dander: Secondary | ICD-10-CM | POA: Diagnosis not present

## 2016-02-27 DIAGNOSIS — J301 Allergic rhinitis due to pollen: Secondary | ICD-10-CM | POA: Diagnosis not present

## 2016-02-27 DIAGNOSIS — J3089 Other allergic rhinitis: Secondary | ICD-10-CM | POA: Diagnosis not present

## 2016-03-04 DIAGNOSIS — J3081 Allergic rhinitis due to animal (cat) (dog) hair and dander: Secondary | ICD-10-CM | POA: Diagnosis not present

## 2016-03-04 DIAGNOSIS — J3089 Other allergic rhinitis: Secondary | ICD-10-CM | POA: Diagnosis not present

## 2016-03-04 DIAGNOSIS — J301 Allergic rhinitis due to pollen: Secondary | ICD-10-CM | POA: Diagnosis not present

## 2016-03-07 DIAGNOSIS — J3089 Other allergic rhinitis: Secondary | ICD-10-CM | POA: Diagnosis not present

## 2016-03-07 DIAGNOSIS — J3081 Allergic rhinitis due to animal (cat) (dog) hair and dander: Secondary | ICD-10-CM | POA: Diagnosis not present

## 2016-03-07 DIAGNOSIS — J301 Allergic rhinitis due to pollen: Secondary | ICD-10-CM | POA: Diagnosis not present

## 2016-03-10 ENCOUNTER — Ambulatory Visit: Payer: Self-pay | Admitting: Family Medicine

## 2016-03-14 DIAGNOSIS — J3081 Allergic rhinitis due to animal (cat) (dog) hair and dander: Secondary | ICD-10-CM | POA: Diagnosis not present

## 2016-03-14 DIAGNOSIS — J301 Allergic rhinitis due to pollen: Secondary | ICD-10-CM | POA: Diagnosis not present

## 2016-03-14 DIAGNOSIS — J3089 Other allergic rhinitis: Secondary | ICD-10-CM | POA: Diagnosis not present

## 2016-03-19 NOTE — Progress Notes (Signed)
[name] Smith D.O. [facility] [address] [place], [place] [address] Phone: [phone] Subjective:    I'm seeing this patient by the request  of:  Tana ConchStephen Hunter, MD   CC: Right knee pain and back pain f/u  ONG:[id number]:Subjective  Tony [name] is a 36 y.o. male coming in with complaint of right knee pain Doing much better at this time. Only hurts when he puts pressure on the knee. Nothing severe.  Mild pain of lower back, been told had SI joint problem.  We have seen patient's multiple times and has responded very well to osteopathic manipulation. States that his back seems to be doing relatively well. Patient denies any new symptoms. Does find that the ergonomics when he is in his trocar seems to be irritating.     Past Medical History:  Diagnosis Date  . ALLERGIC RHINITIS 09/17/2009  . ASTHMA 09/17/2009  . HYPERLIPIDEMIA 09/17/2009   Past Surgical History:  Procedure Laterality Date  . NASAL SINUS SURGERY Right 12/04/2014   Dr. Jenne PaneBates  . SINUS EXPLORATION  Nov 2014    per Dr. Jenne PaneBates    Social History   Social History  . Marital status: Married    Spouse name: N/A  . Number of children: N/A  . Years of education: N/A   Social History Main Topics  . Smoking status: Never Smoker  . Smokeless tobacco: Never Used  . Alcohol use Yes     Comment: occ  . Drug use: No  . Sexual activity: Not on file   Other Topics Concern  . Not on file   Social History Narrative   Married. 2 children. [name] (May 2016). [name] (July 2013).    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 Healthy Mother   . Sinusitis Father     recurrent- sinus surgery multiple. rare fungal infection 20 cases in world.   Marland Kitchen. CAD Father     age 34 first heart attack. never smoker  . Alcohol abuse Father   . Depression Father    abused pain pills in past  . Atrial fibrillation Father   . Kidney disease Father     lifestyle/treatment of sinus issues related  . Healthy Brother     Past medical history, social, surgical and family history all reviewed in electronic medical record.  No pertanent information unless stated regarding to the chief complaint.   Review of Systems: No headache, visual changes, nausea, vomiting, diarrhea, constipation, dizziness, abdominal pain, skin rash, fevers, chills, night sweats, weight loss, swollen lymph nodes, body aches, joint swelling, muscle aches, chest pain, shortness of breath, mood changes.    Objective  There were no vitals taken for this visit.  Systems examined below as of 03/20/16 General: NAD A&O x3 mood, affect normal  HEENT: Pupils equal, extraocular movements intact no nystagmus Respiratory: not short of breath at rest or with speaking Cardiovascular: No lower extremity edema, non tender Skin: Warm dry intact with no signs of infection or rash on extremities or on axial skeleton. Abdomen: Soft nontender, no masses Neuro: Cranial nerves  intact, neurovascularly intact in all extremities with 2+ DTRs and 2+ pulses. Lymph: No lymphadenopathy appreciated today  Gait normal with good balance and coordination.  MSK: Non tender  with full range of motion and good stability and symmetric strength and tone of shoulders, elbows, wrist,  knee hips and ankles bilaterally.     Back Exam:  Inspection: Unremarkable  Motion: Flexion 40 deg, Extension 35 deg, Side Bending to 35 deg bilaterally,  Rotation to 35 deg bilaterally  SLR laying: Negative  XSLR laying: Negative  Palpable tenderness: Mild increasing her spine musculature tenderness over the left sacroiliac joint FABER: positive left  still remaining Sensory change: Gross sensation intact to all lumbar and sacral dermatomes.  Reflexes: 2+ at both patellar tendons, 2+ at achilles tendons, Babinski's downgoing.  Strength  at foot  Plantar-flexion: 5/5 Dorsi-flexion: 5/5 Eversion: 5/5 Inversion: 5/5  Leg strength  Quad: 5/5 Hamstring: 5/5 Hip flexor: 5/5 Hip abductors: 5/5  Gait unremarkable. Very mild worsening from previous exam    Osteopathic findings C4 flexed rotated and side bent left T3 extended rotated and side bent right inhaled third rib T9 extended rotated and side bent left L3 flexed rotated and side bent right Sacrum right on right      Impression and Recommendations:     This case required medical decision making of moderate complexity.      Note: This dictation was prepared with Dragon dictation along with smaller phrase technology. Any transcriptional errors that result from this process are unintentional.

## 2016-03-20 ENCOUNTER — Encounter: Payer: Self-pay | Admitting: Family Medicine

## 2016-03-20 ENCOUNTER — Ambulatory Visit (INDEPENDENT_AMBULATORY_CARE_PROVIDER_SITE_OTHER): Payer: 59 | Admitting: Family Medicine

## 2016-03-20 VITALS — BP 112/80 | HR 60 | Ht 73.0 in | Wt 204.8 lb

## 2016-03-20 DIAGNOSIS — M999 Biomechanical lesion, unspecified: Secondary | ICD-10-CM

## 2016-03-20 DIAGNOSIS — M533 Sacrococcygeal disorders, not elsewhere classified: Secondary | ICD-10-CM | POA: Diagnosis not present

## 2016-03-20 DIAGNOSIS — J301 Allergic rhinitis due to pollen: Secondary | ICD-10-CM | POA: Diagnosis not present

## 2016-03-20 DIAGNOSIS — J3089 Other allergic rhinitis: Secondary | ICD-10-CM | POA: Diagnosis not present

## 2016-03-20 DIAGNOSIS — J3081 Allergic rhinitis due to animal (cat) (dog) hair and dander: Secondary | ICD-10-CM | POA: Diagnosis not present

## 2016-03-20 NOTE — Assessment & Plan Note (Signed)
Stable at the moment. No significant change in management. Patient has done relatively well and wants to follow-up in 3 month intervals.  Encourage patient to do go more core instability exercises. We discussed other ergonomic second be beneficial. We discussed different equipment such as a vest with pockets to be more helpful than a belts. Patient will come back and see me again in 3 months.

## 2016-03-20 NOTE — Assessment & Plan Note (Signed)
Decision today to treat with OMT was based on Physical Exam  After verbal consent patient was treated with HVLA, ME, FPR techniques in  thoracic, lumbar and sacral areas  Patient tolerated the procedure well with improvement in symptoms  Patient given exercises, stretches and lifestyle modifications  See medications in patient instructions if given  Patient will follow up in 12 weeks 

## 2016-03-20 NOTE — Patient Instructions (Signed)
Good to see you  Ice still can help [employer] orthotics "total support" online would be great  Try the exercises 1-2 times a week On wall with heels, butt shoulder and head touching for a goal of 5 minutes daily  See me again in 3 months.

## 2016-03-25 DIAGNOSIS — J3089 Other allergic rhinitis: Secondary | ICD-10-CM | POA: Diagnosis not present

## 2016-04-01 DIAGNOSIS — J301 Allergic rhinitis due to pollen: Secondary | ICD-10-CM | POA: Diagnosis not present

## 2016-04-01 DIAGNOSIS — J3089 Other allergic rhinitis: Secondary | ICD-10-CM | POA: Diagnosis not present

## 2016-04-01 DIAGNOSIS — J3081 Allergic rhinitis due to animal (cat) (dog) hair and dander: Secondary | ICD-10-CM | POA: Diagnosis not present

## 2016-04-18 DIAGNOSIS — J3089 Other allergic rhinitis: Secondary | ICD-10-CM | POA: Diagnosis not present

## 2016-04-18 DIAGNOSIS — J3081 Allergic rhinitis due to animal (cat) (dog) hair and dander: Secondary | ICD-10-CM | POA: Diagnosis not present

## 2016-04-18 DIAGNOSIS — J301 Allergic rhinitis due to pollen: Secondary | ICD-10-CM | POA: Diagnosis not present

## 2016-04-22 DIAGNOSIS — J019 Acute sinusitis, unspecified: Secondary | ICD-10-CM | POA: Diagnosis not present

## 2016-05-05 DIAGNOSIS — J3081 Allergic rhinitis due to animal (cat) (dog) hair and dander: Secondary | ICD-10-CM | POA: Diagnosis not present

## 2016-05-05 DIAGNOSIS — J3089 Other allergic rhinitis: Secondary | ICD-10-CM | POA: Diagnosis not present

## 2016-05-05 DIAGNOSIS — J301 Allergic rhinitis due to pollen: Secondary | ICD-10-CM | POA: Diagnosis not present

## 2016-05-12 ENCOUNTER — Ambulatory Visit (INDEPENDENT_AMBULATORY_CARE_PROVIDER_SITE_OTHER): Payer: 59 | Admitting: Family Medicine

## 2016-05-12 ENCOUNTER — Encounter: Payer: Self-pay | Admitting: Family Medicine

## 2016-05-12 VITALS — BP 110/82 | HR 61 | Temp 97.5°F | Ht 73.75 in | Wt 199.2 lb

## 2016-05-12 DIAGNOSIS — Z0000 Encounter for general adult medical examination without abnormal findings: Secondary | ICD-10-CM

## 2016-05-12 DIAGNOSIS — E785 Hyperlipidemia, unspecified: Secondary | ICD-10-CM

## 2016-05-12 LAB — CBC
HCT: 45.6 % (ref 39.0–52.0)
Hemoglobin: 15 g/dL (ref 13.0–17.0)
MCHC: 32.8 g/dL (ref 30.0–36.0)
MCV: 89.4 fl (ref 78.0–100.0)
PLATELETS: 189 10*3/uL (ref 150.0–400.0)
RBC: 5.1 Mil/uL (ref 4.22–5.81)
RDW: 13.1 % (ref 11.5–15.5)
WBC: 5.2 10*3/uL (ref 4.0–10.5)

## 2016-05-12 LAB — COMPREHENSIVE METABOLIC PANEL
ALBUMIN: 4.2 g/dL (ref 3.5–5.2)
ALK PHOS: 65 U/L (ref 39–117)
ALT: 20 U/L (ref 0–53)
AST: 18 U/L (ref 0–37)
BILIRUBIN TOTAL: 0.5 mg/dL (ref 0.2–1.2)
BUN: 13 mg/dL (ref 6–23)
CALCIUM: 9.2 mg/dL (ref 8.4–10.5)
CHLORIDE: 103 meq/L (ref 96–112)
CO2: 32 mEq/L (ref 19–32)
CREATININE: 0.94 mg/dL (ref 0.40–1.50)
GFR: 97.74 mL/min (ref >60.00)
Glucose, Bld: 84 mg/dL (ref 70–99)
Potassium: 4.3 mEq/L (ref 3.5–5.1)
SODIUM: 139 meq/L (ref 135–145)
TOTAL PROTEIN: 6.4 g/dL (ref 6.0–8.3)

## 2016-05-12 LAB — LIPID PANEL
CHOLESTEROL: 180 mg/dL (ref 0–200)
HDL: 39.7 mg/dL (ref >39.00)
LDL Cholesterol: 117 mg/dL — ABNORMAL HIGH (ref 0–99)
NonHDL: 140.57
TRIGLYCERIDES: 116 mg/dL (ref 0.0–149.0)
Total CHOL/HDL Ratio: 5
VLDL: 23.2 mg/dL (ref 0.0–40.0)

## 2016-05-12 NOTE — Progress Notes (Signed)
Phone: [phone]  Subjective:  Patient presents today for their annual physical. Chief complaint-noted.   See problem oriented charting- ROS- full  review of systems was completed and negative except for: mild cough from allergies  The following were reviewed and entered/updated in epic: Past Medical History:  Diagnosis Date  . ALLERGIC RHINITIS 09/17/2009  . ASTHMA 09/17/2009  . HYPERLIPIDEMIA 09/17/2009   Patient Active Problem List   Diagnosis Date Noted  . Dyslipidemia 09/17/2009    Priority: Medium  . Allergic rhinitis 09/17/2009    Priority: Medium  . Asthma 09/17/2009    Priority: Medium  . Loose stools 10/11/2015    Priority: Low  . Quadriceps tendinitis 11/06/2015  . SI (sacroiliac) joint dysfunction 11/06/2015  . Nonallopathic lesion of sacral region 11/06/2015  . Nonallopathic lesion of lumbosacral region 11/06/2015  . Nonallopathic lesion of thoracic region 11/06/2015   Past Surgical History:  Procedure Laterality Date  . NASAL SINUS SURGERY Right 12/04/2014   Dr. Jenne PaneBates  . SINUS EXPLORATION  Nov 2014    per Dr. Jenne PaneBates     Family History  Problem Relation Age of Onset  . Healthy Mother   . Sinusitis Father     recurrent- sinus surgery multiple. rare fungal infection 20 cases in world.   Marland Kitchen. CAD Father     age 34 first heart attack. never smoker  . Alcohol abuse Father   . Depression Father     abused pain pills in past  . Atrial fibrillation Father   . Kidney disease Father     lifestyle/treatment of sinus issues related  . Healthy Brother     Medications- reviewed and updated Current Outpatient Prescriptions  Medication Sig Dispense Refill  . albuterol (PROVENTIL HFA;VENTOLIN HFA) 108 (90 Base) MCG/ACT inhaler Inhale into the lungs every 6 (six) hours as needed for wheezing or shortness of breath.    . ARNUITY ELLIPTA 100 MCG/ACT AEPB     . EPINEPHrine 0.3 mg/0.3 mL IJ SOAJ injection as directed.    . fexofenadine (ALLEGRA) 180 MG tablet Take  180 mg by mouth daily.    . fluticasone (FLONASE) 50 MCG/ACT nasal spray 2 sprays by Nasal route daily.      . montelukast (SINGULAIR) 10 MG tablet TAKE 1 TABLET IN THE EVENING ONCE A DAY  5  . Olopatadine HCl (PATANASE) 0.6 % SOLN by Nasal route. 2 sprays to each nostril daily      No current facility-administered medications for this visit.     Allergies-reviewed and updated No Known Allergies  Social History   Social History  . Marital status: Married    Spouse name: N/A  . Number of children: N/A  . Years of education: N/A   Social History Main Topics  . Smoking status: Never Smoker  . Smokeless tobacco: Never Used  . Alcohol use Yes     Comment: occ  . Drug use: No  . Sexual activity: Not Asked   Other Topics Concern  . None   Social History Narrative   Married. 2 children. [name] (May 2016). [name] (July 2013).    Wife works as Emergency planning/management officerCPA      Police Officer since 2012      Hobbies: time with sons, going to gym- [employer] 7 days a week   [employer]    Objective: BP 110/82 (BP Location: Left Arm, Patient Position: Sitting, Cuff Size: Normal)   Pulse 61   Temp 97.5 F (36.4 C) (Oral)   Ht 6'  1.75" (1.873 m)   Wt 199 lb 3.2 oz (90.4 kg)   SpO2 96%   BMI 25.75 kg/m  Gen: NAD, resting comfortably HEENT: Mucous membranes are moist. Oropharynx normal Neck: no thyromegaly CV: RRR no murmurs rubs or gallops Lungs: CTAB no crackles, wheeze, rhonchi Abdomen: soft/nontender/nondistended/normal bowel sounds. No rebound or guarding.  Ext: no edema Skin: warm, dry Neuro: grossly normal, moves all extremities, PERRLA Left scrotum- at top of sac there is very slight thickening of skin in area of prior tick bite  Assessment/Plan:  36 y.o. male presenting for annual physical.  Health Maintenance counseling: 1. Anticipatory guidance: Patient counseled regarding regular dental exams -q6 months, eye exams - just saw recently- astigmatism (reading and driving glasses),  wearing seatbelts.  2. Risk factor reduction:  Advised patient of need for regular exercise and diet rich and fruits and vegetables to reduce risk of heart attack and stroke. Exercise- 5 days a week varies- some weights and some cardio. Diet-balanced diet.  Wt Readings from Last 3 Encounters:  05/12/16 199 lb 3.2 oz (90.4 kg)  03/20/16 204 lb 12 oz (92.9 kg)  01/09/16 205 lb (93 kg)  3. Immunizations/screenings/ancillary studies Immunization History  Administered Date(s) Administered  . Influenza Split 09/18/2011, 11/05/2015  . Influenza,inj,Quad PF,36+ Mos 11/02/2012, 01/25/2014, 10/20/2014  . Pneumococcal Polysaccharide-23 11/02/2012  . Tdap 09/18/2011  4. Prostate cancer screening- no family history, start at age 8 5. Colon cancer screening - no family history, start at age 40 6. Skin cancer screening/prevention- advised regular sunscreen use. Denies concerns. Annual mole check with Dr. Terri Piedra.  7. Testicular cancer screening- advised monthly self exams  8. STD screening- patient opts out- monogamous   Status of chronic or acute concerns  Asthma- with Dr. Irena Cords. Once or twice in past month. Stable on arnuity and singulair. Obviously singulair and allegra and flonase and immunotherapy for allergies. Also patanase  dyslipidemia mild- weight loss encouraged.  Removed full tick from top of scrotum- very mild thickening of skin- monitor only  R knee much better after sports medicine referral   IBS- D better as long as avoids fatty foods  Orders Placed This Encounter  Procedures  . CBC    [place]  . Comprehensive metabolic panel    [place]    Order Specific Question:   Has the patient fasted?    Answer:   No  . Lipid panel    [place]    Order Specific Question:   Has the patient fasted?    Answer:   No   Return precautions advised.  Tana Conch, MD

## 2016-05-12 NOTE — Patient Instructions (Addendum)
Please stop by lab before you go  [place] getting the winter weight back off. I think pushing for another 5- 10 lbs off in the next year would be reasonable.

## 2016-05-12 NOTE — Progress Notes (Signed)
Pre visit review using our clinic review tool, if applicable. No additional management support is needed unless otherwise documented below in the visit note. 

## 2016-05-21 DIAGNOSIS — J3081 Allergic rhinitis due to animal (cat) (dog) hair and dander: Secondary | ICD-10-CM | POA: Diagnosis not present

## 2016-05-21 DIAGNOSIS — J301 Allergic rhinitis due to pollen: Secondary | ICD-10-CM | POA: Diagnosis not present

## 2016-05-21 DIAGNOSIS — J3089 Other allergic rhinitis: Secondary | ICD-10-CM | POA: Diagnosis not present

## 2016-05-26 DIAGNOSIS — J3081 Allergic rhinitis due to animal (cat) (dog) hair and dander: Secondary | ICD-10-CM | POA: Diagnosis not present

## 2016-05-26 DIAGNOSIS — J301 Allergic rhinitis due to pollen: Secondary | ICD-10-CM | POA: Diagnosis not present

## 2016-05-29 DIAGNOSIS — J301 Allergic rhinitis due to pollen: Secondary | ICD-10-CM | POA: Diagnosis not present

## 2016-05-29 DIAGNOSIS — J3089 Other allergic rhinitis: Secondary | ICD-10-CM | POA: Diagnosis not present

## 2016-05-29 DIAGNOSIS — J3081 Allergic rhinitis due to animal (cat) (dog) hair and dander: Secondary | ICD-10-CM | POA: Diagnosis not present

## 2016-06-04 DIAGNOSIS — J301 Allergic rhinitis due to pollen: Secondary | ICD-10-CM | POA: Diagnosis not present

## 2016-06-04 DIAGNOSIS — J3089 Other allergic rhinitis: Secondary | ICD-10-CM | POA: Diagnosis not present

## 2016-06-04 DIAGNOSIS — J3081 Allergic rhinitis due to animal (cat) (dog) hair and dander: Secondary | ICD-10-CM | POA: Diagnosis not present

## 2016-06-10 DIAGNOSIS — J3089 Other allergic rhinitis: Secondary | ICD-10-CM | POA: Diagnosis not present

## 2016-06-10 DIAGNOSIS — J301 Allergic rhinitis due to pollen: Secondary | ICD-10-CM | POA: Diagnosis not present

## 2016-06-10 DIAGNOSIS — J3081 Allergic rhinitis due to animal (cat) (dog) hair and dander: Secondary | ICD-10-CM | POA: Diagnosis not present

## 2016-06-13 DIAGNOSIS — J3081 Allergic rhinitis due to animal (cat) (dog) hair and dander: Secondary | ICD-10-CM | POA: Diagnosis not present

## 2016-06-13 DIAGNOSIS — J3089 Other allergic rhinitis: Secondary | ICD-10-CM | POA: Diagnosis not present

## 2016-06-13 DIAGNOSIS — J301 Allergic rhinitis due to pollen: Secondary | ICD-10-CM | POA: Diagnosis not present

## 2016-06-17 DIAGNOSIS — J301 Allergic rhinitis due to pollen: Secondary | ICD-10-CM | POA: Diagnosis not present

## 2016-06-17 DIAGNOSIS — J3081 Allergic rhinitis due to animal (cat) (dog) hair and dander: Secondary | ICD-10-CM | POA: Diagnosis not present

## 2016-06-17 DIAGNOSIS — J3089 Other allergic rhinitis: Secondary | ICD-10-CM | POA: Diagnosis not present

## 2016-06-20 DIAGNOSIS — J301 Allergic rhinitis due to pollen: Secondary | ICD-10-CM | POA: Diagnosis not present

## 2016-06-20 DIAGNOSIS — J3089 Other allergic rhinitis: Secondary | ICD-10-CM | POA: Diagnosis not present

## 2016-06-20 DIAGNOSIS — J3081 Allergic rhinitis due to animal (cat) (dog) hair and dander: Secondary | ICD-10-CM | POA: Diagnosis not present

## 2016-06-23 ENCOUNTER — Ambulatory Visit: Payer: 59 | Admitting: Family Medicine

## 2016-06-27 DIAGNOSIS — J3081 Allergic rhinitis due to animal (cat) (dog) hair and dander: Secondary | ICD-10-CM | POA: Diagnosis not present

## 2016-06-27 DIAGNOSIS — J3089 Other allergic rhinitis: Secondary | ICD-10-CM | POA: Diagnosis not present

## 2016-06-27 DIAGNOSIS — J301 Allergic rhinitis due to pollen: Secondary | ICD-10-CM | POA: Diagnosis not present

## 2016-06-30 DIAGNOSIS — J301 Allergic rhinitis due to pollen: Secondary | ICD-10-CM | POA: Diagnosis not present

## 2016-06-30 DIAGNOSIS — J3081 Allergic rhinitis due to animal (cat) (dog) hair and dander: Secondary | ICD-10-CM | POA: Diagnosis not present

## 2016-06-30 DIAGNOSIS — J3089 Other allergic rhinitis: Secondary | ICD-10-CM | POA: Diagnosis not present

## 2016-07-07 ENCOUNTER — Ambulatory Visit (INDEPENDENT_AMBULATORY_CARE_PROVIDER_SITE_OTHER): Payer: 59 | Admitting: Family Medicine

## 2016-07-07 ENCOUNTER — Encounter: Payer: Self-pay | Admitting: Family Medicine

## 2016-07-07 DIAGNOSIS — K580 Irritable bowel syndrome with diarrhea: Secondary | ICD-10-CM

## 2016-07-07 NOTE — Progress Notes (Addendum)
Subjective:  Tony [name] is a 36 y.o. year old very pleasant male patient who presents for/with See problem oriented charting ROS-no fevers, chills, fatigue/malaise, nausea/vomiting, or recent weight change  Past Medical History-  Patient Active Problem List   Diagnosis Date Noted  . Dyslipidemia 09/17/2009    Priority: Medium  . Allergic rhinitis 09/17/2009    Priority: Medium  . Asthma 09/17/2009    Priority: Medium  . IBS-D 10/11/2015    Priority: Low  . Quadriceps tendinitis 11/06/2015  . SI (sacroiliac) joint dysfunction 11/06/2015  . Nonallopathic lesion of sacral region 11/06/2015  . Nonallopathic lesion of lumbosacral region 11/06/2015  . Nonallopathic lesion of thoracic region 11/06/2015    Medications- reviewed and updated Current Outpatient Prescriptions  Medication Sig Dispense Refill  . albuterol (PROVENTIL HFA;VENTOLIN HFA) 108 (90 Base) MCG/ACT inhaler Inhale into the lungs every 6 (six) hours as needed for wheezing or shortness of breath.    . ARNUITY ELLIPTA 100 MCG/ACT AEPB     . EPINEPHrine 0.3 mg/0.3 mL IJ SOAJ injection as directed.    . fexofenadine (ALLEGRA) 180 MG tablet Take 180 mg by mouth daily.    . fluticasone (FLONASE) 50 MCG/ACT nasal spray 2 sprays by Nasal route daily.      . montelukast (SINGULAIR) 10 MG tablet TAKE 1 TABLET IN THE EVENING ONCE A DAY  5  . Olopatadine HCl (PATANASE) 0.6 % SOLN by Nasal route. 2 sprays to each nostril daily      No current facility-administered medications for this visit.     Objective: BP 110/60 (BP Location: Left Arm, Patient Position: Sitting, Cuff Size: Large)   Pulse 60   Temp 98.3 F (36.8 C) (Oral)   Ht 6' 1.75" (1.873 m)   Wt 200 lb (90.7 kg)   SpO2 96%   BMI 25.85 kg/m  Gen: NAD, resting comfortably CV: RRR no murmurs rubs or gallops Lungs: CTAB no crackles, wheeze, rhonchi Abdomen: soft/nontender/nondistended/normal bowel sounds. No rebound or guarding.  Ext: no edema Skin: warm,  dry  Assessment/Plan:  Patient Instructions  Trial reduction of the O and D group to start for 4 week trial  After 2 weeks if no improvement, add in probiotic- either align or florastor  May trial reductions of other groups if not improving for 2-3 week  If no improvements after that would use sparing imodium  Always happy to refer you to GI for another opinion and would certainly do so if blood in your stool, unintentional weight loss, abnormal fatigue, night sweats, fever.    IBS-D S: Known history of IBS- D. Last viist symptoms were better with avoiding fatty foods.   For 3 weeks or so has had a fair amount of flatulence and bloating. Inconsistent loose stools- large volume. 2-4 bowel movements a day. Has not been doing a lot of fatty foods- not watery as has been in the past and required imodium.   Some abdominal discomfort that improves with bowel movement or passes gas.   Only change is going from spinach salads to romaine heart lettuce. No other significant changes. No higher stressors.   Yogurt has helped in past but not tried recently.   No blood in the stool. No nausea or vomiting.   A/P: Unclear reason for his recent worsening in symptoms. He prefers not to try meds as first step. See avs instructions but we will work to reduce out FODMAP diet and also incorporate probiotic to see if can return his symptoms to  normal. No red flags but we did discuss potentially referring to GI if persistent symptoms not responding to plan. He will update me in about 2 months. Extended counseling  2 month update by [facility] requested. Discussed reducing ice cream intake (nightly) and increasing exercise back up as can help with IBS  The duration of face-to-face time during this visit was greater than 15 minutes. Greater than 50% of this time was spent in counseling, explanation of diagnosis, planning of further management, and/or coordination of care including discussion of FODMAP diet and  avoidance of certain foods, discussion of IBS and managing this illness.    Return precautions advised.  Tana Conch, MD

## 2016-07-07 NOTE — Patient Instructions (Signed)
Trial reduction of the O and D group to start for 4 week trial  After 2 weeks if no improvement, add in probiotic- either align or florastor  May trial reductions of other groups if not improving for 2-3 week  If no improvements after that would use sparing imodium  Always happy to refer you to GI for another opinion and would certainly do so if blood in your stool, unintentional weight loss, abnormal fatigue, night sweats, fever.

## 2016-07-07 NOTE — Assessment & Plan Note (Signed)
S: Known history of IBS- D. Last viist symptoms were better with avoiding fatty foods.   For 3 weeks or so has had a fair amount of flatulence and bloating. Inconsistent loose stools- large volume. 2-4 bowel movements a day. Has not been doing a lot of fatty foods- not watery as has been in the past and required imodium.   Some abdominal discomfort that improves with bowel movement or passes gas.   Only change is going from spinach salads to romaine heart lettuce. No other significant changes. No higher stressors.   Yogurt has helped in past but not tried recently.   No blood in the stool. No nausea or vomiting.   A/P: Unclear reason for his recent worsening in symptoms. He prefers not to try meds as first step. See avs instructions but we will work to reduce out FODMAP diet and also incorporate probiotic to see if can return his symptoms to normal. No red flags but we did discuss potentially referring to GI if persistent symptoms not responding to plan. He will update me in about 2 months. Extended counseling

## 2016-07-08 DIAGNOSIS — J3081 Allergic rhinitis due to animal (cat) (dog) hair and dander: Secondary | ICD-10-CM | POA: Diagnosis not present

## 2016-07-08 DIAGNOSIS — J301 Allergic rhinitis due to pollen: Secondary | ICD-10-CM | POA: Diagnosis not present

## 2016-07-08 DIAGNOSIS — J3089 Other allergic rhinitis: Secondary | ICD-10-CM | POA: Diagnosis not present

## 2016-07-14 ENCOUNTER — Encounter: Payer: Self-pay | Admitting: Family Medicine

## 2016-07-14 ENCOUNTER — Ambulatory Visit (INDEPENDENT_AMBULATORY_CARE_PROVIDER_SITE_OTHER): Payer: 59 | Admitting: Family Medicine

## 2016-07-14 VITALS — BP 108/74 | HR 60 | Ht 73.0 in | Wt 199.0 lb

## 2016-07-14 DIAGNOSIS — M999 Biomechanical lesion, unspecified: Secondary | ICD-10-CM | POA: Diagnosis not present

## 2016-07-14 DIAGNOSIS — M533 Sacrococcygeal disorders, not elsewhere classified: Secondary | ICD-10-CM | POA: Diagnosis not present

## 2016-07-14 NOTE — Patient Instructions (Addendum)
Good to see you  Keep it up  Remember recovery is important as well.  See me again in 3-4 months!

## 2016-07-14 NOTE — Assessment & Plan Note (Signed)
Patient is doing relatively well. Discussed with patient again at great length. We discussed home exercises and icing regimen. Patient is been doing unremarkably well. Follow-up again in 3-4 months.

## 2016-07-14 NOTE — Progress Notes (Signed)
[name] Smith D.O. [facility] [address] [place], [place] [address] Phone: [phone] Subjective:    I'm seeing this patient by the request  of:  Shelva MajesticHunter, Stephen O, MD   CC: Back pain follow-up  BJY:[id number]:Subjective  Tony [name] is a 36 y.o. male coming in with complaint of lower back pain. Patient has been doing conservative therapy. Patient states and overall has been doing relatively well. Working out 5-6 times a week. Having no significant knee pain at this time. Very happy overall. No new symptoms      Past Medical History:  Diagnosis Date  . ALLERGIC RHINITIS 09/17/2009  . ASTHMA 09/17/2009  . HYPERLIPIDEMIA 09/17/2009   Past Surgical History:  Procedure Laterality Date  . NASAL SINUS SURGERY Right 12/04/2014   Dr. Jenne PaneBates  . SINUS EXPLORATION  Nov 2014    per Dr. Jenne PaneBates    Social History   Social History  . Marital status: Married    Spouse name: N/A  . Number of children: N/A  . Years of education: N/A   Social History Main Topics  . Smoking status: Never Smoker  . Smokeless tobacco: Never Used  . Alcohol use Yes     Comment: occ  . Drug use: No  . Sexual activity: Not Asked   Other Topics Concern  . None   Social History Narrative   Married. 2 children. [name] (May 2016). [name] (July 2013).    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 Healthy Mother   . Sinusitis Father        recurrent- sinus surgery multiple. rare fungal infection 20 cases in world.   Marland Kitchen. CAD Father        age 34 first heart attack. never smoker  . Alcohol abuse Father   . Depression Father        abused pain pills in past  . Atrial fibrillation Father   . Kidney disease Father        lifestyle/treatment of sinus issues related  . Healthy Brother     Past medical history, social, surgical and family  history all reviewed in electronic medical record.  No pertanent information unless stated regarding to the chief complaint.   Review of Systems: No headache, visual changes, nausea, vomiting, diarrhea, constipation, dizziness, abdominal pain, skin rash, fevers, chills, night sweats, weight loss, swollen lymph nodes, body aches, joint swelling, muscle aches, chest pain, shortness of breath, mood changes.    Objective  Blood pressure 108/74, pulse 60, height 6\' 1"  (1.854 m), weight 199 lb (90.3 kg), SpO2 98 %.  Systems examined below as of 07/14/16 General: NAD A&O x3 mood, affect normal  HEENT: Pupils equal, extraocular movements intact no nystagmus Respiratory: not short of breath at rest or with speaking Cardiovascular: No lower extremity edema, non tender Skin: Warm dry intact with no signs of infection or rash on extremities or on axial skeleton. Abdomen: Soft nontender, no masses Neuro: Cranial nerves  intact, neurovascularly intact in all extremities with 2+ DTRs and 2+ pulses. Lymph: No lymphadenopathy appreciated today  Gait normal with good balance and coordination.  MSK: Non tender with full range of motion and good stability and symmetric strength and tone of shoulders, elbows, wrist,  knee hips  and ankles bilaterally.     Back Exam:  Inspection: Unremarkable  Motion: Flexion 40 deg, Extension 25 deg, Side Bending to 35 deg bilaterally,  Rotation to 40 deg bilaterally  SLR laying: Negative  XSLR laying: Negative  Palpable tenderness: Mild tenderness over the right sacroiliac joint FABER: negative. Sensory change: Gross sensation intact to all lumbar and sacral dermatomes.  Reflexes: 2+ at both patellar tendons, 2+ at achilles tendons, Babinski's downgoing.  Strength at foot  Plantar-flexion: 5/5 Dorsi-flexion: 5/5 Eversion: 5/5 Inversion: 5/5  Leg strength  Quad: 5/5 Hamstring: 5/5 Hip flexor: 5/5 Hip abductors: 5/5  Gait unremarkable.   Osteopathic findings T6  extended rotated and side bent right  T8 extended rotated and side bent left L1 flexed rotated and side bent left Sacrum right on right     Impression and Recommendations:     This case required medical decision making of moderate complexity.      Note: This dictation was prepared with Dragon dictation along with smaller phrase technology. Any transcriptional errors that result from this process are unintentional.

## 2016-07-14 NOTE — Assessment & Plan Note (Signed)
Decision today to treat with OMT was based on Physical Exam  After verbal consent patient was treated with HVLA, ME, FPR techniques in  thoracic, lumbar and sacral areas  Patient tolerated the procedure well with improvement in symptoms  Patient given exercises, stretches and lifestyle modifications  See medications in patient instructions if given  Patient will follow up in 12-16 weeks

## 2016-07-19 DIAGNOSIS — J019 Acute sinusitis, unspecified: Secondary | ICD-10-CM | POA: Diagnosis not present

## 2016-07-23 DIAGNOSIS — J301 Allergic rhinitis due to pollen: Secondary | ICD-10-CM | POA: Diagnosis not present

## 2016-07-23 DIAGNOSIS — J4540 Moderate persistent asthma, uncomplicated: Secondary | ICD-10-CM | POA: Diagnosis not present

## 2016-07-23 DIAGNOSIS — J3089 Other allergic rhinitis: Secondary | ICD-10-CM | POA: Diagnosis not present

## 2016-07-23 DIAGNOSIS — B999 Unspecified infectious disease: Secondary | ICD-10-CM | POA: Diagnosis not present

## 2016-08-12 DIAGNOSIS — J3081 Allergic rhinitis due to animal (cat) (dog) hair and dander: Secondary | ICD-10-CM | POA: Diagnosis not present

## 2016-08-12 DIAGNOSIS — J3089 Other allergic rhinitis: Secondary | ICD-10-CM | POA: Diagnosis not present

## 2016-08-12 DIAGNOSIS — J301 Allergic rhinitis due to pollen: Secondary | ICD-10-CM | POA: Diagnosis not present

## 2016-08-20 DIAGNOSIS — J3081 Allergic rhinitis due to animal (cat) (dog) hair and dander: Secondary | ICD-10-CM | POA: Diagnosis not present

## 2016-08-20 DIAGNOSIS — J301 Allergic rhinitis due to pollen: Secondary | ICD-10-CM | POA: Diagnosis not present

## 2016-08-20 DIAGNOSIS — J3089 Other allergic rhinitis: Secondary | ICD-10-CM | POA: Diagnosis not present

## 2016-08-28 ENCOUNTER — Ambulatory Visit (INDEPENDENT_AMBULATORY_CARE_PROVIDER_SITE_OTHER): Payer: 59 | Admitting: Family Medicine

## 2016-08-28 ENCOUNTER — Encounter: Payer: Self-pay | Admitting: Family Medicine

## 2016-08-28 VITALS — BP 122/82 | HR 90 | Temp 97.7°F | Ht 73.75 in | Wt 201.0 lb

## 2016-08-28 DIAGNOSIS — J4541 Moderate persistent asthma with (acute) exacerbation: Secondary | ICD-10-CM | POA: Diagnosis not present

## 2016-08-28 MED ORDER — METHYLPREDNISOLONE 8 MG PO TABS: ORAL_TABLET | ORAL | 0 refills | Status: DC

## 2016-08-28 MED ORDER — ALBUTEROL SULFATE HFA 108 (90 BASE) MCG/ACT IN AERS: 2.0000 | INHALATION_SPRAY | Freq: Four times a day (QID) | RESPIRATORY_TRACT | 0 refills | Status: DC | PRN

## 2016-08-28 MED ORDER — BENZONATATE 100 MG PO CAPS: 100.0000 mg | ORAL_CAPSULE | Freq: Three times a day (TID) | ORAL | 0 refills | Status: DC | PRN

## 2016-08-28 NOTE — Progress Notes (Signed)
HPI:  Acute visit for cough and wheezing: -started:the last 3-4 days, he reports there is a cold going through his house -symptoms:nasal congestion - clear, sore throat, cough - not productive, some wheezing and tightness -denies:fever, Thick or increased mucus production with cough, NVD, tooth pain -has tried: his albuterol as expired, he tried his child's nebulizer this morning and this helped -sick contacts/travel/risks: no reported flu, strep or tick exposure -Hx of: allergies and asthma and he sees and asthma specialist and is on Singulair, antihistamine, inhaled corticosteroid and asthma shots.reports he was not able to get in with his specialist today and it is more expensive in terms of a co-pay. Reports he must have methylprednisolone for treatment when he feels this way as he gets a rare side effect prednisone, it causes him hiccups.  ROS: See pertinent positives and negatives per HPI.  Past Medical History:  Diagnosis Date  . ALLERGIC RHINITIS 09/17/2009  . ASTHMA 09/17/2009  . HYPERLIPIDEMIA 09/17/2009    Past Surgical History:  Procedure Laterality Date  . NASAL SINUS SURGERY Right 12/04/2014   Dr. Jenne Pane  . SINUS EXPLORATION  Nov 2014    per Dr. Jenne Pane     Family History  Problem Relation Age of Onset  . Healthy Mother   . Sinusitis Father        recurrent- sinus surgery multiple. rare fungal infection 20 cases in world.   Marland Kitchen CAD Father        age 2 first heart attack. never smoker  . Alcohol abuse Father   . Depression Father        abused pain pills in past  . Atrial fibrillation Father   . Kidney disease Father        lifestyle/treatment of sinus issues related  . Healthy Brother     Social History   Social History  . Marital status: Married    Spouse name: N/A  . Number of children: N/A  . Years of education: N/A   Social History Main Topics  . Smoking status: Never Smoker  . Smokeless tobacco: Never Used  . Alcohol use Yes     Comment: occ  .  Drug use: No  . Sexual activity: Not Asked   Other Topics Concern  . None   Social History Narrative   Married. 2 children. [name] (May 2016). [name] (July 2013).    Wife works as Emergency planning/management officer since 2012      Hobbies: time with sons, going to gym- [employer] 7 days a week   [name]     Current Outpatient Prescriptions:  .  ARNUITY ELLIPTA 100 MCG/ACT AEPB, , Disp: , Rfl:  .  EPINEPHrine 0.3 mg/0.3 mL IJ SOAJ injection, as directed., Disp: , Rfl:  .  fexofenadine (ALLEGRA) 180 MG tablet, Take 180 mg by mouth daily., Disp: , Rfl:  .  fluticasone (FLONASE) 50 MCG/ACT nasal spray, 2 sprays by Nasal route daily.  , Disp: , Rfl:  .  montelukast (SINGULAIR) 10 MG tablet, TAKE 1 TABLET IN THE EVENING ONCE A DAY, Disp: , Rfl: 5 .  Olopatadine HCl (PATANASE) 0.6 % SOLN, by Nasal route. 2 sprays to each nostril daily , Disp: , Rfl:  .  albuterol (PROVENTIL HFA;VENTOLIN HFA) 108 (90 Base) MCG/ACT inhaler, Inhale 2 puffs into the lungs every 6 (six) hours as needed., Disp: 1 Inhaler, Rfl: 0 .  benzonatate (TESSALON PERLES) 100 MG capsule, Take 1 capsule (100 mg  total) by mouth 3 (three) times daily as needed., Disp: 20 capsule, Rfl: 0 .  methylPREDNISolone (MEDROL) 8 MG tablet, 40mg  daily for 3 days (5 tabs), the 24mg  daily for 2 days (3 tabs), then 8mg  (1 tab) daily for 2 days, then stop, Disp: 23 tablet, Rfl: 0  EXAM:  Vitals:   08/28/16 1605  BP: 122/82  Pulse: 90  Temp: 97.7 F (36.5 C)  SpO2: 98%    Body mass index is 25.98 kg/m.  GENERAL: vitals reviewed and listed above, alert, oriented, appears well hydrated and in no acute distress  HEENT: atraumatic, conjunttiva clear, no obvious abnormalities on inspection of external nose and ears, normal appearance of ear canals and TMs, clear nasal congestion, mild post oropharyngeal erythema with PND, no tonsillar edema or exudate, no sinus TTP  NECK: no obvious masses on inspection  LUNGS: clear to auscultation  bilaterally, no wheezes, rales or rhonchi, good air movement  CV: HRRR, no peripheral edema  MS: moves all extremities without noticeable abnormality  PSYCH: pleasant and cooperative, no obvious depression or anxiety  ASSESSMENT AND PLAN:  Discussed the following assessment and plan:  Moderate persistent asthma with exacerbation  We discussed potential etiologies, with VURI and asthma exacerbation being most likely. We'll treat with methylprednisolone given his reported issues with prednisonet, refilled his albuterol and gave Tessalon for cough.We discussed treatment side effects, likely course, antibiotic misuse, transmission, and signs of developing a serious illness. -of course, we advised to return or notify a doctor immediately if symptoms worsen or persist or new concerns arise.    Patient Instructions  Start the steroid taper.  Use albuterol as needed - I sent a new inhaler to the pharmacy.  Tessalon as needed.  I hope you are feeling better soon! Seek care immediately if worsening, new concerns or you are not improving with treatment.     Kriste Basque R., DO

## 2016-08-28 NOTE — Patient Instructions (Signed)
Start the steroid taper.  Use albuterol as needed - I sent a new inhaler to the pharmacy.  Tessalon as needed.  I hope you are feeling better soon! Seek care immediately if worsening, new concerns or you are not improving with treatment.

## 2016-09-04 ENCOUNTER — Ambulatory Visit (INDEPENDENT_AMBULATORY_CARE_PROVIDER_SITE_OTHER): Payer: 59 | Admitting: Adult Health

## 2016-09-04 ENCOUNTER — Encounter: Payer: Self-pay | Admitting: Adult Health

## 2016-09-04 VITALS — BP 110/70 | Temp 97.9°F | Wt 204.0 lb

## 2016-09-04 DIAGNOSIS — H6690 Otitis media, unspecified, unspecified ear: Secondary | ICD-10-CM

## 2016-09-04 MED ORDER — CEFTRIAXONE SODIUM 1 G IJ SOLR
2.0000 g | Freq: Once | INTRAMUSCULAR | Status: AC
  Administered 2016-09-04: 2 g via INTRAMUSCULAR

## 2016-09-04 NOTE — Progress Notes (Signed)
Subjective:    Patient ID: Tony [name], male    DOB: 1980/01/12, 36 y.o.   MRN: [id number]  HPI 36 year old male who  has a past medical history of ALLERGIC RHINITIS (09/17/2009); ASTHMA (09/17/2009); and HYPERLIPIDEMIA (09/17/2009). He is a patient of Dr. Durene Cal who I am seeing today for an acute complaint of several week history of increased right sided ear pain and pressure. He reports that he is also having sinus drainage but does not feel as though he has a sinus infection ,which he has a long history of.   He denies any fevers or feeling ill. He denies any drainage from the right ear.    Review of Systems See HPI   Past Medical History:  Diagnosis Date  . ALLERGIC RHINITIS 09/17/2009  . ASTHMA 09/17/2009  . HYPERLIPIDEMIA 09/17/2009    Social History   Social History  . Marital status: Married    Spouse name: N/A  . Number of children: N/A  . Years of education: N/A   Occupational History  . Not on file.   Social History Main Topics  . Smoking status: Never Smoker  . Smokeless tobacco: Never Used  . Alcohol use Yes     Comment: occ  . Drug use: No  . Sexual activity: Not on file   Other Topics Concern  . Not on file   Social History Narrative   Married. 2 children. [name] (May 2016). [name] (July 2013).    Wife works as Emergency planning/management officer since 2012      Hobbies: time with sons, going to gym- [employer] 7 days a week   [name]    Past Surgical History:  Procedure Laterality Date  . NASAL SINUS SURGERY Right 12/04/2014   Dr. Jenne Pane  . SINUS EXPLORATION  Nov 2014    per Dr. Jenne Pane     Family History  Problem Relation Age of Onset  . Healthy Mother   . Sinusitis Father        recurrent- sinus surgery multiple. rare fungal infection 20 cases in world.   Marland Kitchen CAD Father        age 50 first heart attack. never smoker  . Alcohol abuse Father   . Depression Father        abused pain pills in past  . Atrial fibrillation Father   . Kidney disease  Father        lifestyle/treatment of sinus issues related  . Healthy Brother     No Known Allergies  Current Outpatient Prescriptions on File Prior to Visit  Medication Sig Dispense Refill  . albuterol (PROVENTIL HFA;VENTOLIN HFA) 108 (90 Base) MCG/ACT inhaler Inhale 2 puffs into the lungs every 6 (six) hours as needed. 1 Inhaler 0  . ARNUITY ELLIPTA 100 MCG/ACT AEPB     . benzonatate (TESSALON PERLES) 100 MG capsule Take 1 capsule (100 mg total) by mouth 3 (three) times daily as needed. 20 capsule 0  . EPINEPHrine 0.3 mg/0.3 mL IJ SOAJ injection as directed.    . fexofenadine (ALLEGRA) 180 MG tablet Take 180 mg by mouth daily.    . fluticasone (FLONASE) 50 MCG/ACT nasal spray 2 sprays by Nasal route daily.      . montelukast (SINGULAIR) 10 MG tablet TAKE 1 TABLET IN THE EVENING ONCE A DAY  5  . Olopatadine HCl (PATANASE) 0.6 % SOLN by Nasal route. 2 sprays to each nostril daily  No current facility-administered medications on file prior to visit.     BP 110/70 (BP Location: Left Arm)   Temp 97.9 F (36.6 C) (Oral)   Wt 204 lb (92.5 kg)   BMI 26.37 kg/m       Objective:   Physical Exam  Constitutional: He is oriented to person, place, and time. He appears well-developed and well-nourished. No distress.  HENT:  Right Ear: Hearing and ear canal normal. No drainage. Tympanic membrane is erythematous and bulging. Tympanic membrane is not perforated. No decreased hearing is noted.  Left Ear: Hearing, external ear and ear canal normal. No drainage. Tympanic membrane is bulging. Tympanic membrane is not perforated and not erythematous. No decreased hearing is noted.  Nose: Rhinorrhea (clear drainage ) present. Right sinus exhibits no maxillary sinus tenderness and no frontal sinus tenderness. Left sinus exhibits no maxillary sinus tenderness and no frontal sinus tenderness.  Mouth/Throat: Uvula is midline, oropharynx is clear and moist and mucous membranes are normal.    Cardiovascular: Normal rate, regular rhythm, normal heart sounds and intact distal pulses.  Exam reveals no gallop and no friction rub.   No murmur heard. Pulmonary/Chest: Effort normal and breath sounds normal. No respiratory distress. He has no wheezes. He has no rales. He exhibits no tenderness.  Neurological: He is alert and oriented to person, place, and time.  Skin: Skin is warm and dry. No rash noted. He is not diaphoretic. No erythema. No pallor.  Psychiatric: He has a normal mood and affect. His behavior is normal. Judgment and thought content normal.  Nursing note and vitals reviewed.     Assessment & Plan:  1. Acute otitis media, unspecified otitis media type - Red bulging TM on right side. Will treat for otitis media with 2 gm of IM Ceftriaxone.  - cefTRIAXone (ROCEPHIN) injection 2 g; Inject 2 g into the muscle once. - Also use Flonase - Follow up if no improvement in 2-3 days   Shirline Freesory Mikelle Myrick, NP

## 2016-09-10 DIAGNOSIS — J3081 Allergic rhinitis due to animal (cat) (dog) hair and dander: Secondary | ICD-10-CM | POA: Diagnosis not present

## 2016-09-10 DIAGNOSIS — J3089 Other allergic rhinitis: Secondary | ICD-10-CM | POA: Diagnosis not present

## 2016-09-10 DIAGNOSIS — J301 Allergic rhinitis due to pollen: Secondary | ICD-10-CM | POA: Diagnosis not present

## 2016-09-22 DIAGNOSIS — J329 Chronic sinusitis, unspecified: Secondary | ICD-10-CM | POA: Diagnosis not present

## 2016-09-22 DIAGNOSIS — H9201 Otalgia, right ear: Secondary | ICD-10-CM | POA: Diagnosis not present

## 2016-09-25 ENCOUNTER — Other Ambulatory Visit: Payer: Self-pay | Admitting: Family Medicine

## 2016-09-25 NOTE — Telephone Encounter (Signed)
Last OV 08/28/16 w/ Kandee Keen, 08/25/16 w/ Dr Selena Batten

## 2016-10-10 DIAGNOSIS — J3089 Other allergic rhinitis: Secondary | ICD-10-CM | POA: Diagnosis not present

## 2016-10-10 DIAGNOSIS — J301 Allergic rhinitis due to pollen: Secondary | ICD-10-CM | POA: Diagnosis not present

## 2016-10-10 DIAGNOSIS — J3081 Allergic rhinitis due to animal (cat) (dog) hair and dander: Secondary | ICD-10-CM | POA: Diagnosis not present

## 2016-10-13 DIAGNOSIS — J3089 Other allergic rhinitis: Secondary | ICD-10-CM | POA: Diagnosis not present

## 2016-10-13 DIAGNOSIS — J3081 Allergic rhinitis due to animal (cat) (dog) hair and dander: Secondary | ICD-10-CM | POA: Diagnosis not present

## 2016-10-13 DIAGNOSIS — Z23 Encounter for immunization: Secondary | ICD-10-CM | POA: Diagnosis not present

## 2016-10-13 DIAGNOSIS — J301 Allergic rhinitis due to pollen: Secondary | ICD-10-CM | POA: Diagnosis not present

## 2016-10-16 DIAGNOSIS — J3081 Allergic rhinitis due to animal (cat) (dog) hair and dander: Secondary | ICD-10-CM | POA: Diagnosis not present

## 2016-10-16 DIAGNOSIS — J3089 Other allergic rhinitis: Secondary | ICD-10-CM | POA: Diagnosis not present

## 2016-10-16 DIAGNOSIS — J301 Allergic rhinitis due to pollen: Secondary | ICD-10-CM | POA: Diagnosis not present

## 2016-10-20 DIAGNOSIS — J301 Allergic rhinitis due to pollen: Secondary | ICD-10-CM | POA: Diagnosis not present

## 2016-10-20 DIAGNOSIS — J3089 Other allergic rhinitis: Secondary | ICD-10-CM | POA: Diagnosis not present

## 2016-10-20 DIAGNOSIS — J3081 Allergic rhinitis due to animal (cat) (dog) hair and dander: Secondary | ICD-10-CM | POA: Diagnosis not present

## 2016-10-22 ENCOUNTER — Ambulatory Visit: Payer: Self-pay | Admitting: Family Medicine

## 2016-10-22 NOTE — Progress Notes (Deleted)
[name] Smith D.O. [facility] [address] [place], [place] [address] Phone: [phone] Subjective:    I'm seeing this patient by the request  of:    CC: Low back pain follow-up  OZH:[id number]:Subjective  Tony [name] is a 36 y.o. male coming in with complaint of low back pain. Was seen previously and diagnosed with more of a sacroiliac dysfunction. Started home exercises, osteopathic manipulation, and over-the-counter medications. Patient states  Onset-  Location Duration-  Character- Aggravating factors- Reliving factors-  Therapies tried-  Severity-     Past Medical History:  Diagnosis Date  . ALLERGIC RHINITIS 09/17/2009  . ASTHMA 09/17/2009  . HYPERLIPIDEMIA 09/17/2009   Past Surgical History:  Procedure Laterality Date  . NASAL SINUS SURGERY Right 12/04/2014   Dr. Jenne PaneBates  . SINUS EXPLORATION  Nov 2014    per Dr. Jenne PaneBates    Social History   Social History  . Marital status: Married    Spouse name: N/A  . Number of children: N/A  . Years of education: N/A   Social History Main Topics  . Smoking status: Never Smoker  . Smokeless tobacco: Never Used  . Alcohol use Yes     Comment: occ  . Drug use: No  . Sexual activity: Not on file   Other Topics Concern  . Not on file   Social History Narrative   Married. 2 children. [name] (May 2016). [name] (July 2013).    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 Healthy Mother   . Sinusitis Father        recurrent- sinus surgery multiple. rare fungal infection 20 cases in world.   Marland Kitchen. CAD Father        age 34 first heart attack. never smoker  . Alcohol abuse Father   . Depression Father        abused pain pills in past  . Atrial fibrillation Father   . Kidney disease Father        lifestyle/treatment of sinus issues related  . Healthy Brother       Past medical history, social, surgical and family history all reviewed in electronic medical record.  No pertanent information unless stated regarding to the chief complaint.   Review of Systems:Review of systems updated and as accurate as of 10/22/16  No headache, visual changes, nausea, vomiting, diarrhea, constipation, dizziness, abdominal pain, skin rash, fevers, chills, night sweats, weight loss, swollen lymph nodes, body aches, joint swelling, muscle aches, chest pain, shortness of breath, mood changes.   Objective  There were no vitals taken for this visit. Systems examined below as of 10/22/16   General: No apparent distress alert and oriented x3 mood and affect normal, dressed appropriately.  HEENT: Pupils equal, extraocular movements intact  Respiratory: Patient's speak in full sentences and does not appear short of breath  Cardiovascular: No lower extremity edema, non tender, no erythema  Skin: Warm dry intact with no signs of infection or rash on extremities or on axial skeleton.  Abdomen: Soft nontender  Neuro: Cranial nerves II through XII are intact, neurovascularly intact in all extremities with 2+ DTRs and 2+ pulses.  Lymph: No lymphadenopathy of posterior or anterior cervical chain or axillae bilaterally.  Gait normal with good balance  and coordination.  MSK:  Non tender with full range of motion and good stability and symmetric strength and tone of shoulders, elbows, wrist, hip, knee and ankles bilaterally.     Impression and Recommendations:     This case required medical decision making of moderate complexity.      Note: This dictation was prepared with Dragon dictation along with smaller phrase technology. Any transcriptional errors that result from this process are unintentional.

## 2016-10-27 DIAGNOSIS — J301 Allergic rhinitis due to pollen: Secondary | ICD-10-CM | POA: Diagnosis not present

## 2016-10-27 DIAGNOSIS — J3089 Other allergic rhinitis: Secondary | ICD-10-CM | POA: Diagnosis not present

## 2016-10-27 DIAGNOSIS — J3081 Allergic rhinitis due to animal (cat) (dog) hair and dander: Secondary | ICD-10-CM | POA: Diagnosis not present

## 2016-11-05 DIAGNOSIS — J3089 Other allergic rhinitis: Secondary | ICD-10-CM | POA: Diagnosis not present

## 2016-11-05 DIAGNOSIS — J301 Allergic rhinitis due to pollen: Secondary | ICD-10-CM | POA: Diagnosis not present

## 2016-11-05 DIAGNOSIS — J3081 Allergic rhinitis due to animal (cat) (dog) hair and dander: Secondary | ICD-10-CM | POA: Diagnosis not present

## 2016-11-11 DIAGNOSIS — J301 Allergic rhinitis due to pollen: Secondary | ICD-10-CM | POA: Diagnosis not present

## 2016-11-11 DIAGNOSIS — J3081 Allergic rhinitis due to animal (cat) (dog) hair and dander: Secondary | ICD-10-CM | POA: Diagnosis not present

## 2016-11-11 DIAGNOSIS — J3089 Other allergic rhinitis: Secondary | ICD-10-CM | POA: Diagnosis not present

## 2016-11-13 ENCOUNTER — Ambulatory Visit: Payer: 59 | Admitting: Family Medicine

## 2016-11-13 ENCOUNTER — Encounter: Payer: Self-pay | Admitting: Family Medicine

## 2016-11-13 VITALS — BP 122/88 | HR 67 | Ht 73.0 in | Wt 203.0 lb

## 2016-11-13 DIAGNOSIS — M533 Sacrococcygeal disorders, not elsewhere classified: Secondary | ICD-10-CM

## 2016-11-13 DIAGNOSIS — M999 Biomechanical lesion, unspecified: Secondary | ICD-10-CM | POA: Diagnosis not present

## 2016-11-13 NOTE — Patient Instructions (Addendum)
Good to see you  Gustavus Bryantce is your friend.  duexis 3 times a day for 3 days  Exercises on wall.  Heel and butt touching.  Raise leg 6 inches and hold 2 seconds.  Down slow for count of 4 seconds.  1 set of 30 reps daily on both sides.  Keep working on the hip flexor If you need us call [phone] and we will get you in faster.  See me again in 4-6 weeks

## 2016-11-13 NOTE — Assessment & Plan Note (Signed)
Decision today to treat with OMT was based on Physical Exam  After verbal consent patient was treated with HVLA, ME, FPR techniques in cervical, thoracic, lumbar and sacral areas  Patient tolerated the procedure well with improvement in symptoms  Patient given exercises, stretches and lifestyle modifications  See medications in patient instructions if given  Patient will follow up in 4 weeks 

## 2016-11-13 NOTE — Progress Notes (Signed)
2

## 2016-11-13 NOTE — Assessment & Plan Note (Signed)
Tender to palpation of the paraspinal musculature of lumbar spine right greater than left. Discussed HEP, Discussed OTC meds.  COntinue OMT  RTC in 4 weeks

## 2016-11-13 NOTE — Progress Notes (Signed)
[name] Smith D.O. [facility] [address] [place], [place] [address] Phone: [phone]) [phone] Subjective:     CC: Neck pain  OZH:[id number]:Subjective  Tony [name] is a 36 y.o. male coming in with complaint of neck pain.  We have seen the patient multiple times.  We have done osteopathic manipulation.  Has been quite some time since we have seen patient.  Patient states it started with some tightness.  Also has had trouble with hip flexor.  Having tightness in the lower back.  Denies the pain.  Patient feels that some of the equipment that he wears for work causes some discomfort and imbalances.       Past Medical History:  Diagnosis Date  . ALLERGIC RHINITIS 09/17/2009  . ASTHMA 09/17/2009  . HYPERLIPIDEMIA 09/17/2009   Past Surgical History:  Procedure Laterality Date  . NASAL SINUS SURGERY Right 12/04/2014   Dr. Jenne PaneBates  . SINUS EXPLORATION  Nov 2014    per Dr. Jenne PaneBates    Social History   Socioeconomic History  . Marital status: Married    Spouse name: None  . Number of children: None  . Years of education: None  . Highest education level: None  Social Needs  . Financial resource strain: None  . Food insecurity - worry: None  . Food insecurity - inability: None  . Transportation needs - medical: None  . Transportation needs - non-medical: None  Occupational History  . None  Tobacco Use  . Smoking status: Never Smoker  . Smokeless tobacco: Never Used  Substance and Sexual Activity  . Alcohol use: Yes    Comment: occ  . Drug use: No  . Sexual activity: None  Other Topics Concern  . None  Social History Narrative   Married. 2 children. [name] (May 2016). [name] (July 2013).    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 Healthy Mother   . Sinusitis Father        recurrent- sinus surgery multiple.  rare fungal infection 20 cases in world.   Marland Kitchen. CAD Father        age 34 first heart attack. never smoker  . Alcohol abuse Father   . Depression Father        abused pain pills in past  . Atrial fibrillation Father   . Kidney disease Father        lifestyle/treatment of sinus issues related  . Healthy Brother      Past medical history, social, surgical and family history all reviewed in electronic medical record.  No pertanent information unless stated regarding to the chief complaint.   Review of Systems:Review of systems updated and as accurate as of 11/13/16  No headache, visual changes, nausea, vomiting, diarrhea, constipation, dizziness, abdominal pain, skin rash, fevers, chills, night sweats, weight loss, swollen lymph nodes, body aches, joint swelling,chest pain, shortness of breath, mood changes.    Objective  Blood pressure 122/88, pulse 67, height 6\' 1"  (1.854 m), weight 203 lb (92.1 kg), SpO2 98 %. Systems examined below as of 11/13/16   General: No apparent distress alert and oriented x3 mood and affect normal, dressed appropriately.  HEENT: Pupils equal, extraocular movements intact  Respiratory: Patient's speak in full sentences and does not appear short  of breath  Cardiovascular: No lower extremity edema, non tender, no erythema  Skin: Warm dry intact with no signs of infection or rash on extremities or on axial skeleton.  Abdomen: Soft nontender  Neuro: Cranial nerves II through XII are intact, neurovascularly intact in all extremities with 2+ DTRs and 2+ pulses.  Lymph: No lymphadenopathy of posterior or anterior cervical chain or axillae bilaterally.  Gait normal with good balance and coordination.  MSK:  Non tender with full range of motion and good stability and symmetric strength and tone of shoulders, elbows, wrist, hip, knee and ankles bilaterally.  Neck: Inspection mild increase in lordosis No palpable stepoffs. Negative Spurling's maneuver. Mild loss of range  of motion lacking the last 5 degrees of extension Grip strength and sensation normal in bilateral hands Strength good C4 to T1 distribution No sensory change to C4 to T1 Negative Hoffman sign bilaterally Reflexes normal  Back Exam:  Inspection: Mild loss of lordosis Motion: Flexion 45 deg, Extension 25 deg, Side Bending to 35 deg bilaterally,  Rotation to 45 deg bilaterally  SLR laying: Negative  XSLR laying: Negative  Palpable tenderness: Tender to palpation in the paraspinal musculature of lumbar spine tight hip flexors bilaterally. FABER: negative. Sensory change: Gross sensation intact to all lumbar and sacral dermatomes.  Reflexes: 2+ at both patellar tendons, 2+ at achilles tendons, Babinski's downgoing.  Strength at foot  Plantar-flexion: 5/5 Dorsi-flexion: 5/5 Eversion: 5/5 Inversion: 5/5  Leg strength  Quad: 5/5 Hamstring: 5/5 Hip flexor: 5/5 Hip abductors: 5/5  Gait unremarkable.  Osteopathic findings C2 flexed rotated and side bent right C4 flexed rotated and side bent left C7 flexed rotated and side bent left T3 extended rotated and side bent right inhaled third rib T9 extended rotated and side bent left L1 flexed rotated and side bent right Sacrum right on right    Impression and Recommendations:     This case required medical decision making of moderate complexity.      Note: This dictation was prepared with Dragon dictation along with smaller phrase technology. Any transcriptional errors that result from this process are unintentional.

## 2016-11-17 DIAGNOSIS — J3089 Other allergic rhinitis: Secondary | ICD-10-CM | POA: Diagnosis not present

## 2016-11-17 DIAGNOSIS — J3081 Allergic rhinitis due to animal (cat) (dog) hair and dander: Secondary | ICD-10-CM | POA: Diagnosis not present

## 2016-11-17 DIAGNOSIS — J301 Allergic rhinitis due to pollen: Secondary | ICD-10-CM | POA: Diagnosis not present

## 2016-11-21 DIAGNOSIS — J301 Allergic rhinitis due to pollen: Secondary | ICD-10-CM | POA: Diagnosis not present

## 2016-11-21 DIAGNOSIS — J3081 Allergic rhinitis due to animal (cat) (dog) hair and dander: Secondary | ICD-10-CM | POA: Diagnosis not present

## 2016-11-21 DIAGNOSIS — J3089 Other allergic rhinitis: Secondary | ICD-10-CM | POA: Diagnosis not present

## 2016-12-10 NOTE — Progress Notes (Deleted)
[name] Smith D.O. [facility] [address] [place], [place] [address] Phone: [phone] Subjective:    I'm seeing this patient by the request  of:    CC:   KGM:[id number]:Subjective  Tony [name] is a 36 y.o. male coming in with complaint of ***  Onset-  Location Duration-  Character- Aggravating factors- Reliving factors-  Therapies tried-  Severity-     Past Medical History:  Diagnosis Date  . ALLERGIC RHINITIS 09/17/2009  . ASTHMA 09/17/2009  . HYPERLIPIDEMIA 09/17/2009   Past Surgical History:  Procedure Laterality Date  . NASAL SINUS SURGERY Right 12/04/2014   Dr. Jenne PaneBates  . SINUS EXPLORATION  Nov 2014    per Dr. Jenne PaneBates    Social History   Socioeconomic History  . Marital status: Married    Spouse name: Not on file  . Number of children: Not on file  . Years of education: Not on file  . Highest education level: Not on file  Social Needs  . Financial resource strain: Not on file  . Food insecurity - worry: Not on file  . Food insecurity - inability: Not on file  . Transportation needs - medical: Not on file  . Transportation needs - non-medical: Not on file  Occupational History  . Not on file  Tobacco Use  . Smoking status: Never Smoker  . Smokeless tobacco: Never Used  Substance and Sexual Activity  . Alcohol use: Yes    Comment: occ  . Drug use: No  . Sexual activity: Not on file  Other Topics Concern  . Not on file  Social History Narrative   Married. 2 children. [name] (May 2016). [name] (July 2013).    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 Healthy Mother   . Sinusitis Father        recurrent- sinus surgery multiple. rare fungal infection 20 cases in world.   Marland Kitchen. CAD Father        age 34 first heart attack. never smoker  . Alcohol abuse Father   . Depression Father       abused pain pills in past  . Atrial fibrillation Father   . Kidney disease Father        lifestyle/treatment of sinus issues related  . Healthy Brother      Past medical history, social, surgical and family history all reviewed in electronic medical record.  No pertanent information unless stated regarding to the chief complaint.   Review of Systems:Review of systems updated and as accurate as of 12/10/16  No headache, visual changes, nausea, vomiting, diarrhea, constipation, dizziness, abdominal pain, skin rash, fevers, chills, night sweats, weight loss, swollen lymph nodes, body aches, joint swelling, muscle aches, chest pain, shortness of breath, mood changes.   Objective  There were no vitals taken for this visit. Systems examined below as of 12/10/16   General: No apparent distress alert and oriented x3 mood and affect normal, dressed appropriately.  HEENT: Pupils equal, extraocular movements intact  Respiratory: Patient's speak in full sentences and does not appear short of breath  Cardiovascular: No lower extremity edema, non tender, no erythema  Skin: Warm dry intact with no signs of infection or rash on extremities or on axial skeleton.  Abdomen: Soft  nontender  Neuro: Cranial nerves II through XII are intact, neurovascularly intact in all extremities with 2+ DTRs and 2+ pulses.  Lymph: No lymphadenopathy of posterior or anterior cervical chain or axillae bilaterally.  Gait normal with good balance and coordination.  MSK:  Non tender with full range of motion and good stability and symmetric strength and tone of shoulders, elbows, wrist, hip, knee and ankles bilaterally.     Impression and Recommendations:     This case required medical decision making of moderate complexity.      Note: This dictation was prepared with Dragon dictation along with smaller phrase technology. Any transcriptional errors that result from this process are unintentional.

## 2016-12-11 ENCOUNTER — Ambulatory Visit: Payer: Self-pay | Admitting: Family Medicine

## 2016-12-22 NOTE — Progress Notes (Signed)
Tony [name] D.O. [facility] [address] [place], [place] [address] Phone: [phone]) [phone] Subjective:     CC: Back pain follow-up  DGU:[id number]:Subjective  Tony Coppericholas Sockwell is a 36 y.o. male coming in with complaint of back pain follow-up.  Found to have more of a sacroiliac dysfunction.  Responded fairly well to osteopathic manipulation.  Patient states that he had an increase in his pain on Saturday on the left side of his lumbar pain. He has not idea of his mechanism of injury.      Past Medical History:  Diagnosis Date  . ALLERGIC RHINITIS 09/17/2009  . ASTHMA 09/17/2009  . HYPERLIPIDEMIA 09/17/2009   Past Surgical History:  Procedure Laterality Date  . NASAL SINUS SURGERY Right 12/04/2014   Dr. Jenne PaneBates  . SINUS EXPLORATION  Nov 2014    per Dr. Jenne PaneBates    Social History   Socioeconomic History  . Marital status: Married    Spouse name: None  . Number of children: None  . Years of education: None  . Highest education level: None  Social Needs  . Financial resource strain: None  . Food insecurity - worry: None  . Food insecurity - inability: None  . Transportation needs - medical: None  . Transportation needs - non-medical: None  Occupational History  . None  Tobacco Use  . Smoking status: Never Smoker  . Smokeless tobacco: Never Used  Substance and Sexual Activity  . Alcohol use: Yes    Comment: occ  . Drug use: No  . Sexual activity: None  Other Topics Concern  . None  Social History Narrative   Married. 2 children. [name] (May 2016). [name] (July 2013).    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 Healthy Mother   . Sinusitis Father        recurrent- sinus surgery multiple. rare fungal infection 20 cases in world.   Marland Kitchen. CAD Father        age 34 first heart attack. never smoker  . Alcohol abuse  Father   . Depression Father        abused pain pills in past  . Atrial fibrillation Father   . Kidney disease Father        lifestyle/treatment of sinus issues related  . Healthy Brother      Past medical history, social, surgical and family history all reviewed in electronic medical record.  No pertanent information unless stated regarding to the chief complaint.   Review of Systems:Review of systems updated and as accurate as of 12/23/16  No headache, visual changes, nausea, vomiting, diarrhea, constipation, dizziness, abdominal pain, skin rash, fevers, chills, night sweats, weight loss, swollen lymph nodes, body aches, joint swelling, muscle aches, chest pain, shortness of breath, mood changes.   Objective  Blood pressure (!) 132/92, pulse 82, height 6\' 1"  (1.854 m), weight 213 lb (96.6 kg), SpO2 98 %. Systems examined below as of 12/23/16   General: No apparent distress alert and oriented x3 mood and affect normal, dressed appropriately.  HEENT: Pupils equal, extraocular movements intact  Respiratory: Patient's speak in full sentences and does not appear short of breath  Cardiovascular: No lower extremity edema, non tender, no erythema  Skin: Warm dry intact with no signs  of infection or rash on extremities or on axial skeleton.  Abdomen: Soft nontender  Neuro: Cranial nerves II through XII are intact, neurovascularly intact in all extremities with 2+ DTRs and 2+ pulses.  Lymph: No lymphadenopathy of posterior or anterior cervical chain or axillae bilaterally.  Gait normal with good balance and coordination.  MSK:  Non tender with full range of motion and good stability and symmetric strength and tone of shoulders, elbows, wrist, hip, knee and ankles bilaterally.  Back Exam:  Inspection: Unremarkable  Motion: Flexion 45 deg, Extension 45 deg, Side Bending to 45 deg bilaterally,  Rotation to 45 deg bilaterally  SLR laying: Negative  XSLR laying: Negative  Palpable tenderness:  Positive pain in the paraspinal musculature in the left sacroiliac joint. FABER: Positive left. Sensory change: Gross sensation intact to all lumbar and sacral dermatomes.  Reflexes: 2+ at both patellar tendons, 2+ at achilles tendons, Babinski's downgoing.  Strength at foot  Plantar-flexion: 5/5 Dorsi-flexion: 5/5 Eversion: 5/5 Inversion: 5/5  Leg strength  Quad: 5/5 Hamstring: 5/5 Hip flexor: 5/5 Hip abductors: 5/5  Gait unremarkable.  Osteopathic findings C2 flexed rotated and side bent right C4 flexed rotated and side bent left C7 flexed rotated and side bent left T3 extended rotated and side bent right inhaled third rib T6 extended rotated and side bent left L2 flexed rotated and side bent left Sacrum left on left     Impression and Recommendations:     This case required medical decision making of moderate complexity.      Note: This dictation was prepared with Dragon dictation along with smaller phrase technology. Any transcriptional errors that result from this process are unintentional.       ;l

## 2016-12-23 ENCOUNTER — Ambulatory Visit: Payer: 59 | Admitting: Family Medicine

## 2016-12-23 ENCOUNTER — Encounter: Payer: Self-pay | Admitting: Family Medicine

## 2016-12-23 VITALS — BP 132/92 | HR 82 | Ht 73.0 in | Wt 213.0 lb

## 2016-12-23 DIAGNOSIS — M533 Sacrococcygeal disorders, not elsewhere classified: Secondary | ICD-10-CM | POA: Diagnosis not present

## 2016-12-23 DIAGNOSIS — M999 Biomechanical lesion, unspecified: Secondary | ICD-10-CM | POA: Diagnosis not present

## 2016-12-23 MED ORDER — PREDNISONE 50 MG PO TABS: 50.0000 mg | ORAL_TABLET | Freq: Every day | ORAL | 0 refills | Status: DC

## 2016-12-23 NOTE — Assessment & Plan Note (Signed)
Decision today to treat with OMT was based on Physical Exam  After verbal consent patient was treated with HVLA, ME, FPR techniques in cervical, thoracic, lumbar and sacral areas  Patient tolerated the procedure well with improvement in symptoms  Patient given exercises, stretches and lifestyle modifications  See medications in patient instructions if given  Patient will follow up in 2-3 weeks 

## 2016-12-23 NOTE — Patient Instructions (Addendum)
Good to see you  Tony [name] is your friend.  pennsaid pinkie amount topically 2 times daily as needed.   Prednisone daily for 5 days if not better in next week  Happy holidays!  See me again in 2-3 weeks

## 2016-12-23 NOTE — Assessment & Plan Note (Signed)
Discussed hep Discussed sij injection.  Patient declined.  Attempted osteopath.  Discussed topical anti-inflammatories and encouraged home exercises and core strengthening a more regular basis.  Follow-up again in 2-3 weeks

## 2017-01-07 DIAGNOSIS — J301 Allergic rhinitis due to pollen: Secondary | ICD-10-CM | POA: Diagnosis not present

## 2017-01-07 DIAGNOSIS — J3081 Allergic rhinitis due to animal (cat) (dog) hair and dander: Secondary | ICD-10-CM | POA: Diagnosis not present

## 2017-01-07 DIAGNOSIS — J3089 Other allergic rhinitis: Secondary | ICD-10-CM | POA: Diagnosis not present

## 2017-01-13 NOTE — Progress Notes (Signed)
[name] Kitana Gage D.O. [facility] [address] [place], [place] [address] Phone: [phone] Subjective:     CC: Low back pain follow-up  BJY:[id number]:Subjective  Tony Coppericholas Pew is a 37 y.o. male coming in with complaint of low back pain. He did have some pain recently but states that his back is fine today. He said that his pain is likely due to wearing his belt at work as he works as a Emergency planning/management officerpolice officer.  Patient states that being in the car seems to give him more discomfort and pain.  Patient states very intermittent pain going down the left leg.  Nothing severe.  Patient feels though that it is slowly worsening over the course of time.  Has responded well to physical therapy in the past.      Past Medical History:  Diagnosis Date  . ALLERGIC RHINITIS 09/17/2009  . ASTHMA 09/17/2009  . HYPERLIPIDEMIA 09/17/2009   Past Surgical History:  Procedure Laterality Date  . NASAL SINUS SURGERY Right 12/04/2014   Dr. Jenne PaneBates  . SINUS EXPLORATION  Nov 2014    per Dr. Jenne PaneBates    Social History   Socioeconomic History  . Marital status: Married    Spouse name: None  . Number of children: None  . Years of education: None  . Highest education level: None  Social Needs  . Financial resource strain: None  . Food insecurity - worry: None  . Food insecurity - inability: None  . Transportation needs - medical: None  . Transportation needs - non-medical: None  Occupational History  . None  Tobacco Use  . Smoking status: Never Smoker  . Smokeless tobacco: Never Used  Substance and Sexual Activity  . Alcohol use: Yes    Comment: occ  . Drug use: No  . Sexual activity: None  Other Topics Concern  . None  Social History Narrative   Married. 2 children. [name] (May 2016). [name] (July 2013).    Wife works as Emergency planning/management officerCPA      Police Officer since 2012      Hobbies: time with sons, going to gym- [employer] 7 days a week   [employer]   No Known Allergies Family History  Problem Relation Age of  Onset  . Healthy Mother   . Sinusitis Father        recurrent- sinus surgery multiple. rare fungal infection 20 cases in world.   Marland Kitchen. CAD Father        age 35 first heart attack. never smoker  . Alcohol abuse Father   . Depression Father        abused pain pills in past  . Atrial fibrillation Father   . Kidney disease Father        lifestyle/treatment of sinus issues related  . Healthy Brother      Past medical history, social, surgical and family history all reviewed in electronic medical record.  No pertanent information unless stated regarding to the chief complaint.   Review of Systems:Review of systems updated and as accurate as of 01/14/17  No headache, visual changes, nausea, vomiting, diarrhea, constipation, dizziness, abdominal pain, skin rash, fevers, chills, night sweats, weight loss, swollen lymph nodes, body aches, joint swelling,  chest pain, shortness of breath, mood changes.  Positive muscle aches  Objective  Blood pressure 122/90, pulse 93, height 6\' 1"  (1.854 m), weight 202 lb (91.6 kg), SpO2 96 %. Systems examined below as of 01/14/17   General: No apparent distress alert and oriented x3  mood and affect normal, dressed appropriately.  HEENT: Pupils equal, extraocular movements intact  Respiratory: Patient's speak in full sentences and does not appear short of breath  Cardiovascular: No lower extremity edema, non tender, no erythema  Skin: Warm dry intact with no signs of infection or rash on extremities or on axial skeleton.  Abdomen: Soft nontender  Neuro: Cranial nerves II through XII are intact, neurovascularly intact in all extremities with 2+ DTRs and 2+ pulses.  Lymph: No lymphadenopathy of posterior or anterior cervical chain or axillae bilaterally.  Gait normal with good balance and coordination.  MSK:  Non tender with full range of motion and good stability and symmetric strength and tone of shoulders, elbows, wrist, hip, knee and ankles bilaterally.  Back  Exam:  Inspection: Mild loss of lordosis Motion: Flexion 40 deg, Extension 25 deg, Side Bending to 35 deg bilaterally,  Rotation to 45 deg bilaterally  SLR laying: Negative  XSLR laying: Negative  Palpable tenderness: Tender to palpation on the left paraspinal musculature in the left sacroiliac joint mild in the right parascapular region FABER: Positive left. Sensory change: Gross sensation intact to all lumbar and sacral dermatomes.  Reflexes: 2+ at both patellar tendons, 2+ at achilles tendons, Babinski's downgoing.  Strength at foot  Plantar-flexion: 5/5 Dorsi-flexion: 5/5 Eversion: 5/5 Inversion: 5/5  Leg strength  Quad: 5/5 Hamstring: 5/5 Hip flexor: 5/5 Hip abductors: 5/5  Gait unremarkable.  Osteopathic findings  C2 flexed rotated and side bent right C6 flexed rotated and side bent left T3 extended rotated and side bent right inhaled third rib T9 extended rotated and side bent right  L2 flexed rotated and side bent right Sacrum left on left     Impression and Recommendations:     This case required medical decision making of moderate complexity.      Note: This dictation was prepared with Dragon dictation along with smaller phrase technology. Any transcriptional errors that result from this process are unintentional.

## 2017-01-14 ENCOUNTER — Encounter: Payer: Self-pay | Admitting: Family Medicine

## 2017-01-14 ENCOUNTER — Ambulatory Visit: Payer: 59 | Admitting: Family Medicine

## 2017-01-14 VITALS — BP 122/90 | HR 93 | Ht 73.0 in | Wt 202.0 lb

## 2017-01-14 DIAGNOSIS — M545 Low back pain: Secondary | ICD-10-CM

## 2017-01-14 DIAGNOSIS — M533 Sacrococcygeal disorders, not elsewhere classified: Secondary | ICD-10-CM | POA: Diagnosis not present

## 2017-01-14 DIAGNOSIS — M999 Biomechanical lesion, unspecified: Secondary | ICD-10-CM | POA: Diagnosis not present

## 2017-01-14 DIAGNOSIS — G8929 Other chronic pain: Secondary | ICD-10-CM | POA: Diagnosis not present

## 2017-01-14 NOTE — Assessment & Plan Note (Signed)
Continues to have some difficulty.  Seem to be more on the left side today.  Patient will be sent to formal physical therapy with some of the worsening discomfort and pain.  Still responds fairly well to osteopathic manipulation.  Encourage patient to continue to work on hip abductor strengthening and ergonomics.  We discussed posture.  Follow-up again 4-8 weeks

## 2017-01-14 NOTE — Patient Instructions (Signed)
Happy New Year!  See me again in 4 weeks

## 2017-01-14 NOTE — Assessment & Plan Note (Signed)
Decision today to treat with OMT was based on Physical Exam  After verbal consent patient was treated with HVLA, ME, FPR techniques in cervical, thoracic, lumbar and sacral areas  Patient tolerated the procedure well with improvement in symptoms  Patient given exercises, stretches and lifestyle modifications  See medications in patient instructions if given  Patient will follow up in 4-8 weeks 

## 2017-01-27 DIAGNOSIS — J3089 Other allergic rhinitis: Secondary | ICD-10-CM | POA: Diagnosis not present

## 2017-01-27 DIAGNOSIS — J301 Allergic rhinitis due to pollen: Secondary | ICD-10-CM | POA: Diagnosis not present

## 2017-01-27 DIAGNOSIS — J3081 Allergic rhinitis due to animal (cat) (dog) hair and dander: Secondary | ICD-10-CM | POA: Diagnosis not present

## 2017-01-29 ENCOUNTER — Ambulatory Visit: Payer: 59 | Admitting: Physical Therapy

## 2017-01-29 ENCOUNTER — Encounter: Payer: Self-pay | Admitting: Physical Therapy

## 2017-01-29 DIAGNOSIS — J3081 Allergic rhinitis due to animal (cat) (dog) hair and dander: Secondary | ICD-10-CM | POA: Diagnosis not present

## 2017-01-29 DIAGNOSIS — J3089 Other allergic rhinitis: Secondary | ICD-10-CM | POA: Diagnosis not present

## 2017-01-29 DIAGNOSIS — J301 Allergic rhinitis due to pollen: Secondary | ICD-10-CM | POA: Diagnosis not present

## 2017-01-29 DIAGNOSIS — M545 Low back pain, unspecified: Secondary | ICD-10-CM

## 2017-01-29 DIAGNOSIS — G8929 Other chronic pain: Secondary | ICD-10-CM | POA: Diagnosis not present

## 2017-01-29 NOTE — Therapy (Signed)
[facility] [address] [place], [place], [address] Phone: [phone]   Fax:  [phone]  Physical Therapy Evaluation  Patient Details  Name: Tony Coppericholas Rozario MRN: [id number] Date of Birth: 10/31/1980 Referring Provider: Katrinka BlazingSmith   Encounter Date: 01/29/2017  PT End of Session - 01/29/17 1544    Visit Number  1    Number of Visits  12    Date for PT Re-Evaluation  03/12/17    Authorization Type  UHC    PT Start Time  0930    PT Stop Time  1030    PT Time Calculation (min)  60 min    Activity Tolerance  Patient tolerated treatment well    Behavior During Therapy  [facility] for tasks assessed/performed       Past Medical History:  Diagnosis Date  . ALLERGIC RHINITIS 09/17/2009  . ASTHMA 09/17/2009  . HYPERLIPIDEMIA 09/17/2009    Past Surgical History:  Procedure Laterality Date  . NASAL SINUS SURGERY Right 12/04/2014   Dr. Jenne PaneBates  . SINUS EXPLORATION  Nov 2014    per Dr. Jenne PaneBates     There were no vitals filed for this visit.   Subjective Assessment - 01/29/17 0937    Subjective  Pt states previous pain in L SI region in 2004, that resolved. He recently has had increased pain, no incident to report. Pt works as Emergency planning/management officerpolice officer, wears heavy belt. Denies tingling, does state mild numbness , but is rare.      Limitations  Sitting;House hold activities;Lifting;Standing    Currently in Pain?  Yes    Pain Score  4     Pain Location  Back    Pain Orientation  Left;Lower         [facility] - 01/29/17 0001      Assessment   Medical Diagnosis  Low Back Pain (L)    Referring Provider  Katrinka BlazingSmith      Precautions   Precautions  None      Balance Screen   Has the patient fallen in the past 6 months  No      Prior Function   Level of Independence  Independent      Cognition   Overall Cognitive Status  Within Functional Limits for tasks assessed      ROM / Strength   AROM / PROM / Strength  AROM;Strength      AROM   Overall AROM Comments  Hip: wfl, knee: wnl    AROM Assessment Site  Lumbar    Lumbar Flexion  Significant limitation    Lumbar Extension  min limitation    Lumbar - Right Side Bend  wfl    Lumbar - Left Side Bend  wfl      Strength   Overall Strength Comments  Hips: 4-/5; Core:3/5      Flexibility   Soft Tissue Assessment /Muscle Length  yes    Hamstrings  significant limitation      Palpation   SI assessment   Painful on L ; Hypomobile     Palpation comment  Significant pain and tenderness in L glute med; tightness and limited motion in posterior chain      Special Tests   Other special tests  Negative LLTT             Objective measurements completed on examination: See above findings.      [facility]/Ex[facility] - 01/29/17 0001      Exercises  Exercises  Lumbar;Knee/Hip      Lumbar Exercises: Stretches   Active Hamstring Stretch  3 reps;30 seconds    Active Hamstring Stretch Limitations  Seated    Single Knee to Chest Stretch  3 reps;30 seconds    Pelvic Tilt  20 reps    Piriformis Stretch  3 reps;30 seconds    Piriformis Stretch Limitations  seated    Figure 4 Stretch  2 reps;30 seconds    Figure 4 Stretch Limitations  modified position      Modalities   Modalities  Moist Heat      Moist Heat Therapy   Number Minutes Moist Heat  10 Minutes    Moist Heat Location  Lumbar Spine      Manual Therapy   Manual Therapy  Soft tissue mobilization;Taping    Soft tissue mobilization  DTM to L glute med    Kinesiotex  -- L SI stabilization X             PT Education - 01/29/17 1544    Education provided  Yes    Education Details  HEP    Person(s) Educated  Patient    Methods  Explanation;Handout;Verbal cues    Comprehension  Verbalized understanding;Need further instruction;Verbal cues required       PT Short Term Goals - 01/29/17 1553      PT SHORT TERM GOAL #1   Title  Pt to be independent with initial HEP     Time  2    Period   Weeks    Status  New    Target Date  02/12/17      PT SHORT TERM GOAL #2   Title  Pt to report decreased pain to 3-4/10 with activity and work duties     Time  2    Period  Weeks    Status  New    Target Date  02/12/17        PT Long Term Goals - 01/29/17 1553      PT LONG TERM GOAL #1   Title  Pt to report decreased pain in low back to 0-2/10 with sitting, standing, and work duties     Time  6    Period  Weeks    Status  New    Target Date  03/12/17      PT LONG TERM GOAL #2   Title  Pt to demo improved lumbar ROM to be [facility] for pt age, to improve ability for transfers, squatting, and work duties    Time  6    Period  Weeks    Status  New    Target Date  03/12/17      PT LONG TERM GOAL #3   Title  Pt to demo improved LE and core strength to be at least 4+/5 to improve stability and pain.     Time  6    Period  Weeks    Status  New    Target Date  03/12/17             Plan - 01/29/17 1545    Clinical Impression Statement  Clinical Impression Statement   Pt presents with primary complaint of increased pain in L SI region. He has significant tightness and soreness in L glute med, and painful palpation of L SI. He has significant tightness and ROM restrictions in hamstrings, low back, and posterior chain. He has weakness and instability in core and hips. Pt with lack  of effective HEP, and will benefit from education on this. Pt with deficits that are effecting ability to perform ADLs, IADLs, and functional activity and work duties. Pto benefit from skilled physical therapy to address above deficits and return to PLOF without pain. Prognosis for recovery is good. Home exercise program issued today.      Clinical Presentation  Stable    Clinical Decision Making  Low    Rehab Potential  Good    PT Frequency  2x / week    PT Duration  6 weeks    PT Treatment/Interventions  ADLs/Self Care Home Management;Cryotherapy;Electrical Stimulation;Ultrasound;Moist Heat;Gait  training;Stair training;Functional mobility training;Therapeutic activities;Therapeutic exercise;Orthotic Fit/Training;Patient/family education;Neuromuscular re-education;Manual techniques;Taping;Dry needling;Passive range of motion       Patient will benefit from skilled therapeutic intervention in order to improve the following deficits and impairments:  Abnormal gait, Pain, Decreased activity tolerance, Decreased endurance, Decreased range of motion, Decreased strength, Hypomobility, Impaired flexibility, Difficulty walking  Visit Diagnosis: Chronic left-sided low back pain without sciatica - Plan: PT plan of care cert/re-cert     Problem List Patient Active Problem List   Diagnosis Date Noted  . Quadriceps tendinitis 11/06/2015  . SI (sacroiliac) joint dysfunction 11/06/2015  . Nonallopathic lesion of sacral region 11/06/2015  . Nonallopathic lesion of lumbosacral region 11/06/2015  . Nonallopathic lesion of thoracic region 11/06/2015  . IBS-D 10/11/2015  . Dyslipidemia 09/17/2009  . Allergic rhinitis 09/17/2009  . Asthma 09/17/2009   Sedalia Muta, PT, DPT 4:06 PM  01/29/17    [facility] [address] [place], [place], [address] Phone: [phone]   Fax:  [phone]  Name: Tony [name] MRN: [id number] Date of Birth: 05/14/1980

## 2017-02-02 ENCOUNTER — Ambulatory Visit: Payer: 59 | Admitting: Physical Therapy

## 2017-02-02 ENCOUNTER — Encounter: Payer: Self-pay | Admitting: Physical Therapy

## 2017-02-02 DIAGNOSIS — M545 Low back pain: Secondary | ICD-10-CM

## 2017-02-02 DIAGNOSIS — G8929 Other chronic pain: Secondary | ICD-10-CM | POA: Diagnosis not present

## 2017-02-02 NOTE — Therapy (Signed)
[facility] [address] [place], [place], [address] Phone: [phone]   Fax:  [phone]  Physical Therapy Treatment  Patient Details  Name: Tony [name] MRN: [id number] Date of Birth: 12/06/1980 Referring Provider: Katrinka Blazing   Encounter Date: 02/02/2017  PT End of Session - 02/02/17 1526    Visit Number  2    Number of Visits  12    Date for PT Re-Evaluation  03/12/17    Authorization Type  UHC    PT Start Time  0230    PT Stop Time  0315    PT Time Calculation (min)  45 min    Activity Tolerance  Patient tolerated treatment well    Behavior During Therapy  [facility] for tasks assessed/performed       Past Medical History:  Diagnosis Date  . ALLERGIC RHINITIS 09/17/2009  . ASTHMA 09/17/2009  . HYPERLIPIDEMIA 09/17/2009    Past Surgical History:  Procedure Laterality Date  . NASAL SINUS SURGERY Right 12/04/2014   Dr. Jenne Pane  . SINUS EXPLORATION  Nov 2014    per Dr. Jenne Pane     There were no vitals filed for this visit.  Subjective Assessment - 02/02/17 1658    Subjective  Pt states significant pain improvement for a few days. Was sore yesterday and today, but less than initially. Has been doing HEP, and felt tape helped .    Currently in Pain?  Yes    Pain Score  3     Pain Location  Back    Pain Orientation  Left;Lower    Pain Descriptors / Indicators  Aching    Pain Type  Chronic pain    Pain Onset  More than a month ago    Pain Frequency  Intermittent                      [facility] Adult PT Treatment/Exercise - 02/02/17 1429      Exercises   Exercises  Lumbar;Knee/Hip      Lumbar Exercises: Stretches   Active Hamstring Stretch  --    Active Hamstring Stretch Limitations  --    Single Knee to Chest Stretch  3 reps;30 seconds    Pelvic Tilt  --    Piriformis Stretch  --    Piriformis Stretch Limitations  --    Figure 4 Stretch  2 reps;30 seconds    Figure 4 Stretch Limitations  modified position       Lumbar Exercises: Supine   Clam  20 reps    Clam Limitations  alternating with TA      Knee/Hip Exercises: Standing   Hip Abduction  2 sets;10 reps;Both    Hip Extension  2 sets;10 reps;Both      Knee/Hip Exercises: Supine   Bridges  2 sets;10 reps    Straight Leg Raises  2 sets;10 reps;Both      Knee/Hip Exercises: Sidelying   Hip ABduction  2 sets;10 reps;Both      Modalities   Modalities  --      Moist Heat Therapy   Moist Heat Location  --      Manual Therapy   Manual Therapy  Soft tissue mobilization;Taping;Passive ROM    Soft tissue mobilization  DTM and IASM to L glute and glute med    Passive ROM  Supine hip IR/ER with TPR to glute    Kinesiotex  -- L SI stabilization X  PT Education - 02/02/17 1659    Education provided  Yes    Education Details  HEP    Person(s) Educated  Patient    Methods  Explanation;Verbal cues;Handout    Comprehension  Verbalized understanding       PT Short Term Goals - 01/29/17 1553      PT SHORT TERM GOAL #1   Title  Pt to be independent with initial HEP     Time  2    Period  Weeks    Status  New    Target Date  02/12/17      PT SHORT TERM GOAL #2   Title  Pt to report decreased pain to 3-4/10 with activity and work duties     Time  2    Period  Weeks    Status  New    Target Date  02/12/17        PT Long Term Goals - 01/29/17 1553      PT LONG TERM GOAL #1   Title  Pt to report decreased pain in low back to 0-2/10 with sitting, standing, and work duties     Time  6    Period  Weeks    Status  New    Target Date  03/12/17      PT LONG TERM GOAL #2   Title  Pt to demo improved lumbar ROM to be [facility], to improve ability for transfers, squatting, and work duties    Time  6    Period  Weeks    Status  New    Target Date  03/12/17      PT LONG TERM GOAL #3   Title  Pt to demo improved LE and core strength to be at least 4+/5 to improve stability and pain.     Time  6    Period  Weeks     Status  New    Target Date  03/12/17            Plan - 02/02/17 1700    Clinical Impression Statement  Pt with soreness and tenderness in L glute and glute med, but decreased from last week. Manual therapy and taping done to improve this. Pt challenged with hip and core exercises today, due to muscle weakness. Plan to progress strength as pain improves.    PT Treatment/Interventions  ADLs/Self Care Home Management;Cryotherapy;Electrical Stimulation;Ultrasound;Moist Heat;Gait training;Stair training;Functional mobility training;Therapeutic activities;Therapeutic exercise;Orthotic Fit/Training;Patient/family education;Neuromuscular re-education;Manual techniques;Taping;Dry needling;Passive range of motion       Patient will benefit from skilled therapeutic intervention in order to improve the following deficits and impairments:  Abnormal gait, Pain, Decreased activity tolerance, Decreased endurance, Decreased range of motion, Decreased strength, Hypomobility, Impaired flexibility, Difficulty walking  Visit Diagnosis: Chronic left-sided low back pain without sciatica     Problem List Patient Active Problem List   Diagnosis Date Noted  . Quadriceps tendinitis 11/06/2015  . SI (sacroiliac) joint dysfunction 11/06/2015  . Nonallopathic lesion of sacral region 11/06/2015  . Nonallopathic lesion of lumbosacral region 11/06/2015  . Nonallopathic lesion of thoracic region 11/06/2015  . IBS-D 10/11/2015  . Dyslipidemia 09/17/2009  . Allergic rhinitis 09/17/2009  . Asthma 09/17/2009   Sedalia MutaLauren Navreet Bolda, PT, DPT 5:01 PM  02/02/17    [facility] [address] [place], [place], [address] Phone: [phone]   Fax:  [phone]  Name: Tony [name] MRN: [id number] Date of Birth: 07/17/1980

## 2017-02-04 ENCOUNTER — Encounter: Payer: Self-pay | Admitting: Physical Therapy

## 2017-02-04 ENCOUNTER — Ambulatory Visit: Payer: 59 | Admitting: Physical Therapy

## 2017-02-04 DIAGNOSIS — G8929 Other chronic pain: Secondary | ICD-10-CM | POA: Diagnosis not present

## 2017-02-04 DIAGNOSIS — M545 Low back pain, unspecified: Secondary | ICD-10-CM

## 2017-02-04 NOTE — Therapy (Signed)
[facility] [address] [place], [place], [address] Phone: [phone]   Fax:  [phone]  Physical Therapy Treatment  Patient Details  Name: Tony [name] MRN: [id number] Date of Birth: [birth year] Referring Provider: Katrinka Blazing   Encounter Date: 02/04/2017  PT End of Session - 02/04/17 1041    Visit Number  3    Number of Visits  12    Date for PT Re-Evaluation  03/12/17    Authorization Type  UHC    PT Start Time  0840    PT Stop Time  0928    PT Time Calculation (min)  48 min    Activity Tolerance  Patient tolerated treatment well    Behavior During Therapy  [facility] for tasks assessed/performed       Past Medical History:  Diagnosis Date  . ALLERGIC RHINITIS 09/17/2009  . ASTHMA 09/17/2009  . HYPERLIPIDEMIA 09/17/2009    Past Surgical History:  Procedure Laterality Date  . NASAL SINUS SURGERY Right 12/04/2014   Dr. Jenne Pane  . SINUS EXPLORATION  Nov 2014    per Dr. Jenne Pane     There were no vitals filed for this visit.  Subjective Assessment - 02/04/17 0842    Subjective  Pt states increased soreness yesterday, but still improved from previous weeks .     Currently in Pain?  Yes    Pain Score  2     Pain Location  Back    Pain Orientation  Left;Lower    Pain Descriptors / Indicators  Aching    Pain Type  Chronic pain    Pain Onset  More than a month ago    Pain Frequency  Intermittent                      [facility] Adult PT Treatment/Exercise - 02/04/17 0847      Exercises   Exercises  Lumbar;Knee/Hip      Lumbar Exercises: Stretches   Single Knee to Chest Stretch  3 reps;30 seconds    Pelvic Tilt  20 reps    Figure 4 Stretch  2 reps;30 seconds    Figure 4 Stretch Limitations  modified position    Other Lumbar Stretch Exercise  LTR with PBall    Other Lumbar Stretch Exercise  Childs pose x3       Lumbar Exercises: Supine   Clam  20 reps    Clam Limitations  alternating with TA    Other Supine Lumbar  Exercises  90/90 holds 10 sec x3      Knee/Hip Exercises: Standing   Hip Abduction  2 sets;10 reps;Both YTB    Hip Extension  --      Knee/Hip Exercises: Supine   Bridges  2 sets;10 reps    Straight Leg Raises  2 sets;10 reps;Both      Knee/Hip Exercises: Sidelying   Hip ABduction  --      Knee/Hip Exercises: Prone   Hip Extension  2 sets;10 reps      Manual Therapy   Manual Therapy  Soft tissue mobilization;Taping;Passive ROM    Soft tissue mobilization  DTM and IASM to L glute and glute med    Passive ROM  --    Kinesiotex  -- L SI stabilization X             PT Education - 02/04/17 0846    Education provided  Yes    Education Details  HEP  Person(s) Educated  Patient    Methods  Explanation    Comprehension  Verbalized understanding       PT Short Term Goals - 01/29/17 1553      PT SHORT TERM GOAL #1   Title  Pt to be independent with initial HEP     Time  2    Period  Weeks    Status  New    Target Date  02/12/17      PT SHORT TERM GOAL #2   Title  Pt to report decreased pain to 3-4/10 with activity and work duties     Time  2    Period  Weeks    Status  New    Target Date  02/12/17        PT Long Term Goals - 01/29/17 1553      PT LONG TERM GOAL #1   Title  Pt to report decreased pain in low back to 0-2/10 with sitting, standing, and work duties     Time  6    Period  Weeks    Status  New    Target Date  03/12/17      PT LONG TERM GOAL #2   Title  Pt to demo improved lumbar ROM to be [facility], to improve ability for transfers, squatting, and work duties    Time  6    Period  Weeks    Status  New    Target Date  03/12/17      PT LONG TERM GOAL #3   Title  Pt to demo improved LE and core strength to be at least 4+/5 to improve stability and pain.     Time  6    Period  Weeks    Status  New    Target Date  03/12/17            Plan - 02/04/17 1043    Clinical Impression Statement  Pt with less pain overall, and less  pain with movement. He does have significant soreness in L glut med musculature, Manual and taping done for this today. Pt able to progress stabilization today, no increased pain in low back, but pt very challenged with core and hip exercises ,due to weakness.        Patient will benefit from skilled therapeutic intervention in order to improve the following deficits and impairments:     Visit Diagnosis: Chronic left-sided low back pain without sciatica     Problem List Patient Active Problem List   Diagnosis Date Noted  . Quadriceps tendinitis 11/06/2015  . SI (sacroiliac) joint dysfunction 11/06/2015  . Nonallopathic lesion of sacral region 11/06/2015  . Nonallopathic lesion of lumbosacral region 11/06/2015  . Nonallopathic lesion of thoracic region 11/06/2015  . IBS-D 10/11/2015  . Dyslipidemia 09/17/2009  . Allergic rhinitis 09/17/2009  . Asthma 09/17/2009   Sedalia MutaLauren Jami Bogdanski, PT, DPT 10:44 AM  02/04/17    [facility] [address] [place], [place], [address] Phone: [phone]   Fax:  [phone]  Name: Tony [name] MRN: [id number] Date of Birth: 07/27/1980

## 2017-02-09 ENCOUNTER — Encounter: Payer: Self-pay | Admitting: Physical Therapy

## 2017-02-09 ENCOUNTER — Ambulatory Visit: Payer: 59 | Admitting: Physical Therapy

## 2017-02-09 DIAGNOSIS — M545 Low back pain, unspecified: Secondary | ICD-10-CM

## 2017-02-09 DIAGNOSIS — G8929 Other chronic pain: Secondary | ICD-10-CM | POA: Diagnosis not present

## 2017-02-09 NOTE — Therapy (Signed)
[facility] [address] [place], [place], [address] Phone: [phone]   Fax:  [phone]  Physical Therapy Treatment  Patient Details  Name: Tony [name] MRN: [id number] Date of Birth: 11/25/1980 Referring Provider: Katrinka BlazingSmith   Encounter Date: 02/09/2017  PT End of Session - 02/09/17 1119    Visit Number  4    Number of Visits  12    Date for PT Re-Evaluation  03/12/17    Authorization Type  UHC    PT Start Time  0935    PT Stop Time  1030    PT Time Calculation (min)  55 min    Activity Tolerance  Patient tolerated treatment well    Behavior During Therapy  [facility] for tasks assessed/performed       Past Medical History:  Diagnosis Date  . ALLERGIC RHINITIS 09/17/2009  . ASTHMA 09/17/2009  . HYPERLIPIDEMIA 09/17/2009    Past Surgical History:  Procedure Laterality Date  . NASAL SINUS SURGERY Right 12/04/2014   Dr. Jenne PaneBates  . SINUS EXPLORATION  Nov 2014    per Dr. Jenne PaneBates     There were no vitals filed for this visit.  Subjective Assessment - 02/09/17 1117    Subjective  Pt reports tightness feeling today and over the weekend, but improved pain.     Currently in Pain?  Yes    Pain Score  2     Pain Location  Back    Pain Orientation  Left;Lower    Pain Descriptors / Indicators  Tightness    Pain Type  Chronic pain    Pain Onset  More than a month ago    Pain Frequency  Intermittent                      [facility] Adult PT Treatment/Exercise - 02/09/17 0942      Exercises   Exercises  Lumbar;Knee/Hip      Lumbar Exercises: Stretches   Single Knee to Chest Stretch  3 reps;30 seconds    Pelvic Tilt  20 reps    Figure 4 Stretch  2 reps;30 seconds    Figure 4 Stretch Limitations  modified position      Lumbar Exercises: Aerobic   Stationary Bike  L2 x8       Lumbar Exercises: Supine   Clam  20 reps    Clam Limitations  alternating with TA    Other Supine Lumbar Exercises  90/90 holds 10 sec x3      Lumbar Exercises: Quadruped   Other Quadruped Lumbar Exercises  Quadruped hip extension x20       Knee/Hip Exercises: Standing   Hip Abduction  2 sets;10 reps;Both YTB    Abduction Limitations  YTB    Hip Extension  2 sets;10 reps;Both    Extension Limitations  YTB      Knee/Hip Exercises: Supine   Bridges  2 sets;10 reps    Straight Leg Raises  2 sets;10 reps;Both      Knee/Hip Exercises: Sidelying   Other Sidelying Knee/Hip Exercises  Hip abd with ext 2x5 bil      Knee/Hip Exercises: Prone   Hip Extension  2 sets;10 reps      Manual Therapy   Manual Therapy  Soft tissue mobilization;Taping;Passive ROM    Soft tissue mobilization  DTM to L glute and glute med    Kinesiotex  --             PT  Education - 02/09/17 1118    Education provided  Yes    Education Details  HEP, [employer]) Educated  Patient    Methods  Explanation    Comprehension  Verbalized understanding       PT Short Term Goals - 01/29/17 1553      PT SHORT TERM GOAL #1   Title  Pt to be independent with initial HEP     Time  2    Period  Weeks    Status  New    Target Date  02/12/17      PT SHORT TERM GOAL #2   Title  Pt to report decreased pain to 3-4/10 with activity and work duties     Time  2    Period  Weeks    Status  New    Target Date  02/12/17        PT Long Term Goals - 01/29/17 1553      PT LONG TERM GOAL #1   Title  Pt to report decreased pain in low back to 0-2/10 with sitting, standing, and work duties     Time  6    Period  Weeks    Status  New    Target Date  03/12/17      PT LONG TERM GOAL #2   Title  Pt to demo improved lumbar ROM to be [facility] for pt age, to improve ability for transfers, squatting, and work duties    Time  6    Period  Weeks    Status  New    Target Date  03/12/17      PT LONG TERM GOAL #3   Title  Pt to demo improved LE and core strength to be at least 4+/5 to improve stability and pain.     Time  6    Period  Weeks     Status  New    Target Date  03/12/17            Plan - 02/09/17 1120    Clinical Impression Statement  Pt challenged with stability exercises today, requires cuing to keep neutral spine, and fatigues easily with hip strengthening. He has overall decreasing pain levels, but still has significant sorness with palpation and DTM to glute med. Pt may benefit from addition of dry needling to this area.     PT Treatment/Interventions  ADLs/Self Care Home Management;Cryotherapy;Electrical Stimulation;Ultrasound;Moist Heat;Gait training;Stair training;Functional mobility training;Therapeutic activities;Therapeutic exercise;Orthotic Fit/Training;Patient/family education;Neuromuscular re-education;Manual techniques;Taping;Dry needling;Passive range of motion    PT Next Visit Plan  Try dry needling to L Glute and glute med       Patient will benefit from skilled therapeutic intervention in order to improve the following deficits and impairments:  Abnormal gait, Pain, Decreased activity tolerance, Decreased endurance, Decreased range of motion, Decreased strength, Hypomobility, Impaired flexibility, Difficulty walking  Visit Diagnosis: Chronic left-sided low back pain without sciatica     Problem List Patient Active Problem List   Diagnosis Date Noted  . Quadriceps tendinitis 11/06/2015  . SI (sacroiliac) joint dysfunction 11/06/2015  . Nonallopathic lesion of sacral region 11/06/2015  . Nonallopathic lesion of lumbosacral region 11/06/2015  . Nonallopathic lesion of thoracic region 11/06/2015  . IBS-D 10/11/2015  . Dyslipidemia 09/17/2009  . Allergic rhinitis 09/17/2009  . Asthma 09/17/2009   Sedalia Muta, PT, DPT 11:23 AM  02/09/17    [facility] [address] [place], [place],  [address] Phone: [phone]   Fax:  [phone]  Name: Tony [name] MRN: [id number] Date of Birth: 01/26/1980

## 2017-02-11 ENCOUNTER — Ambulatory Visit: Payer: Self-pay | Admitting: Family Medicine

## 2017-02-12 DIAGNOSIS — J301 Allergic rhinitis due to pollen: Secondary | ICD-10-CM | POA: Diagnosis not present

## 2017-02-12 DIAGNOSIS — J3089 Other allergic rhinitis: Secondary | ICD-10-CM | POA: Diagnosis not present

## 2017-02-12 DIAGNOSIS — J3081 Allergic rhinitis due to animal (cat) (dog) hair and dander: Secondary | ICD-10-CM | POA: Diagnosis not present

## 2017-02-13 ENCOUNTER — Encounter: Payer: Self-pay | Admitting: Physical Therapy

## 2017-02-13 ENCOUNTER — Ambulatory Visit: Payer: 59 | Admitting: Physical Therapy

## 2017-02-13 DIAGNOSIS — M545 Low back pain: Secondary | ICD-10-CM

## 2017-02-13 DIAGNOSIS — G8929 Other chronic pain: Secondary | ICD-10-CM | POA: Diagnosis not present

## 2017-02-13 NOTE — Patient Instructions (Signed)

## 2017-02-13 NOTE — Therapy (Signed)
[facility] [address] [place], [place], [address] Phone: [phone]   Fax:  [phone]  Physical Therapy Treatment  Patient Details  Name: Tony [name] MRN: [id number] Date of Birth: 05-Jul-1980 Referring Provider: Katrinka Blazing   Encounter Date: 02/13/2017  PT End of Session - 02/13/17 0921    Visit Number  5    Number of Visits  12    Date for PT Re-Evaluation  03/12/17    Authorization Type  UHC    PT Start Time  0840    PT Stop Time  0920    PT Time Calculation (min)  40 min    Activity Tolerance  Patient tolerated treatment well    Behavior During Therapy  [facility] for tasks assessed/performed       Past Medical History:  Diagnosis Date  . ALLERGIC RHINITIS 09/17/2009  . ASTHMA 09/17/2009  . HYPERLIPIDEMIA 09/17/2009    Past Surgical History:  Procedure Laterality Date  . NASAL SINUS SURGERY Right 12/04/2014   Dr. Jenne Pane  . SINUS EXPLORATION  Nov 2014    per Dr. Jenne Pane     There were no vitals filed for this visit.  Subjective Assessment - 02/13/17 0843    Subjective  feels tight today; otherwise doing well    Limitations  Sitting;House hold activities;Lifting;Standing    Currently in Pain?  Yes    Pain Score  1     Pain Location  Back    Pain Orientation  Left    Pain Descriptors / Indicators  Tightness    Pain Type  Chronic pain    Pain Onset  More than a month ago    Pain Frequency  Intermittent                      [facility] Adult PT Treatment/Exercise - 02/13/17 0845      Lumbar Exercises: Stretches   Single Knee to Chest Stretch  Right;2 reps;30 seconds    Piriformis Stretch  Right;2 reps;30 seconds      Lumbar Exercises: Aerobic   Stationary Bike  L3 x8       Manual Therapy   Manual Therapy  Soft tissue mobilization;Myofascial release    Manual therapy comments  skilled palpation and monitoring of soft tissue during DN    Soft tissue mobilization  DTM to L glute and glute med    Myofascial  Release  Lt glute med       Trigger Point Dry Needling - 02/13/17 0919    Consent Given?  Yes    Education Handout Provided  Yes    Muscles Treated Lower Body  Gluteus maximus and glute med    Gluteus Maximus Response  Twitch response elicited;Palpable increased muscle length           PT Education - 02/13/17 0921    Education provided  Yes    Education Details  DN (printed but forgot to give, will give next session); verbally reviewed    Person(s) Educated  Patient    Methods  Explanation;Handout    Comprehension  Verbalized understanding       PT Short Term Goals - 02/13/17 9528      PT SHORT TERM GOAL #1   Title  Pt to be independent with initial HEP     Status  On-going      PT SHORT TERM GOAL #2   Title  Pt to report decreased pain to 3-4/10 with activity and work  duties     Status  On-going        PT Long Term Goals - 01/29/17 1553      PT LONG TERM GOAL #1   Title  Pt to report decreased pain in low back to 0-2/10 with sitting, standing, and work duties     Time  6    Period  Weeks    Status  New    Target Date  03/12/17      PT LONG TERM GOAL #2   Title  Pt to demo improved lumbar ROM to be [facility], to improve ability for transfers, squatting, and work duties    Time  6    Period  Weeks    Status  New    Target Date  03/12/17      PT LONG TERM GOAL #3   Title  Pt to demo improved LE and core strength to be at least 4+/5 to improve stability and pain.     Time  6    Period  Weeks    Status  New    Target Date  03/12/17            Plan - 02/13/17 [id number]    Clinical Impression Statement  Pt tolerated DN well today with good twitch responses noted, and decreased tenderness with manual therapy following.  Pt will continue to benefit from PT to maximize function.    PT Treatment/Interventions  ADLs/Self Care Home Management;Cryotherapy;Electrical Stimulation;Ultrasound;Moist Heat;Gait training;Stair training;Functional mobility  training;Therapeutic activities;Therapeutic exercise;Orthotic Fit/Training;Patient/family education;Neuromuscular re-education;Manual techniques;Taping;Dry needling;Passive range of motion    PT Next Visit Plan  assess response to DN, continue stabilization/muscle energy techniques; look at STGs     Consulted and Agree with Plan of Care  Patient       Patient will benefit from skilled therapeutic intervention in order to improve the following deficits and impairments:  Abnormal gait, Pain, Decreased activity tolerance, Decreased endurance, Decreased range of motion, Decreased strength, Hypomobility, Impaired flexibility, Difficulty walking  Visit Diagnosis: Chronic left-sided low back pain without sciatica     Problem List Patient Active Problem List   Diagnosis Date Noted  . Quadriceps tendinitis 11/06/2015  . SI (sacroiliac) joint dysfunction 11/06/2015  . Nonallopathic lesion of sacral region 11/06/2015  . Nonallopathic lesion of lumbosacral region 11/06/2015  . Nonallopathic lesion of thoracic region 11/06/2015  . IBS-D 10/11/2015  . Dyslipidemia 09/17/2009  . Allergic rhinitis 09/17/2009  . Asthma 09/17/2009      Clarita CraneStephanie F Marshella Tello, PT, DPT 02/13/17 9:24 AM    [facility] [address] [place], [place], [address] Phone: [phone]   Fax:  [phone]  Name: Creed Coppericholas Wenrich MRN: [id number] Date of Birth: 11/28/1980

## 2017-02-19 DIAGNOSIS — J3089 Other allergic rhinitis: Secondary | ICD-10-CM | POA: Diagnosis not present

## 2017-02-20 ENCOUNTER — Ambulatory Visit: Payer: 59 | Admitting: Physical Therapy

## 2017-02-20 DIAGNOSIS — G8929 Other chronic pain: Secondary | ICD-10-CM | POA: Diagnosis not present

## 2017-02-20 DIAGNOSIS — M545 Low back pain: Secondary | ICD-10-CM

## 2017-02-20 NOTE — Therapy (Signed)
[facility] [address] [place], [place], [address] Phone: [phone]   Fax:  [phone]  Physical Therapy Treatment  Patient Details  Name: Tony [name] MRN: [id number] Date of Birth: 06/11/1980 Referring Provider: Katrinka BlazingSmith   Encounter Date: 02/20/2017  PT End of Session - 02/20/17 0922    Visit Number  6    Number of Visits  12    Date for PT Re-Evaluation  03/12/17    Authorization Type  UHC    PT Start Time  0842    PT Stop Time  0922    PT Time Calculation (min)  40 min    Activity Tolerance  Patient tolerated treatment well    Behavior During Therapy  [facility] for tasks assessed/performed       Past Medical History:  Diagnosis Date  . ALLERGIC RHINITIS 09/17/2009  . ASTHMA 09/17/2009  . HYPERLIPIDEMIA 09/17/2009    Past Surgical History:  Procedure Laterality Date  . NASAL SINUS SURGERY Right 12/04/2014   Dr. Jenne PaneBates  . SINUS EXPLORATION  Nov 2014    per Dr. Jenne PaneBates     There were no vitals filed for this visit.  Subjective Assessment - 02/20/17 0844    Subjective  felt great after DN last session, but then had to sit in class for 4 days and reports increased pain and tightness    Limitations  Sitting;House hold activities;Lifting;Standing    Currently in Pain?  Yes    Pain Score  1     Pain Location  Back    Pain Orientation  Left    Pain Descriptors / Indicators  Tightness    Pain Type  Chronic pain    Pain Onset  More than a month ago    Pain Frequency  Intermittent    Aggravating Factors   sitting; wearing work belt    Pain Relieving Factors  stretches, standing, moving                      [facility] Adult PT Treatment/Exercise - 02/20/17 0846      Lumbar Exercises: Stretches   Passive Hamstring Stretch  2 reps;30 seconds;Right;Left    Single Knee to Chest Stretch  2 reps;30 seconds;Left;Right    ITB Stretch  Right;Left;3 reps;30 seconds    Piriformis Stretch  2 reps;30 seconds;Left;Right      Lumbar Exercises: Aerobic   Stationary Bike  L3 x8 min      Manual Therapy   Manual Therapy  Soft tissue mobilization;Myofascial release    Manual therapy comments  skilled palpation and monitoring of soft tissue during DN    Soft tissue mobilization  IASTM to L glute and glute med    Myofascial Release  Lt glute med/max       Trigger Point Dry Needling - 02/20/17 0913    Consent Given?  Yes    Education Handout Provided  Yes    Muscles Treated Lower Body  Gluteus maximus    Gluteus Maximus Response  Twitch response elicited;Palpable increased muscle length             PT Short Term Goals - 02/13/17 [id number]      PT SHORT TERM GOAL #1   Title  Pt to be independent with initial HEP     Status  On-going      PT SHORT TERM GOAL #2   Title  Pt to report decreased pain to 3-4/10 with activity and work duties  Status  On-going        PT Long Term Goals - 01/29/17 1553      PT LONG TERM GOAL #1   Title  Pt to report decreased pain in low back to 0-2/10 with sitting, standing, and work duties     Time  6    Period  Weeks    Status  New    Target Date  03/12/17      PT LONG TERM GOAL #2   Title  Pt to demo improved lumbar ROM to be [facility] for pt age, to improve ability for transfers, squatting, and work duties    Time  6    Period  Weeks    Status  New    Target Date  03/12/17      PT LONG TERM GOAL #3   Title  Pt to demo improved LE and core strength to be at least 4+/5 to improve stability and pain.     Time  6    Period  Weeks    Status  New    Target Date  03/12/17            Plan - 02/20/17 4098    Clinical Impression Statement  Pt with decreased pain and tightness following DN today.  Overall progressing well towards goals.     PT Treatment/Interventions  ADLs/Self Care Home Management;Cryotherapy;Electrical Stimulation;Ultrasound;Moist Heat;Gait training;Stair training;Functional mobility training;Therapeutic activities;Therapeutic exercise;Orthotic  Fit/Training;Patient/family education;Neuromuscular re-education;Manual techniques;Taping;Dry needling;Passive range of motion    PT Next Visit Plan  assess response to DN, continue stabilization/muscle energy techniques; look at STGs     Consulted and Agree with Plan of Care  Patient       Patient will benefit from skilled therapeutic intervention in order to improve the following deficits and impairments:  Abnormal gait, Pain, Decreased activity tolerance, Decreased endurance, Decreased range of motion, Decreased strength, Hypomobility, Impaired flexibility, Difficulty walking  Visit Diagnosis: Chronic left-sided low back pain without sciatica     Problem List Patient Active Problem List   Diagnosis Date Noted  . Quadriceps tendinitis 11/06/2015  . SI (sacroiliac) joint dysfunction 11/06/2015  . Nonallopathic lesion of sacral region 11/06/2015  . Nonallopathic lesion of lumbosacral region 11/06/2015  . Nonallopathic lesion of thoracic region 11/06/2015  . IBS-D 10/11/2015  . Dyslipidemia 09/17/2009  . Allergic rhinitis 09/17/2009  . Asthma 09/17/2009      Clarita Crane, PT, DPT 02/20/17 9:24 AM    [facility] [place] [facility] [address] [place], [place], [address] Phone: [phone]   Fax:  [phone]  Name: Tony [name] MRN: [id number] Date of Birth: February 15, 1980

## 2017-02-26 ENCOUNTER — Ambulatory Visit: Payer: 59 | Admitting: Physical Therapy

## 2017-02-26 DIAGNOSIS — M545 Low back pain, unspecified: Secondary | ICD-10-CM

## 2017-02-26 DIAGNOSIS — G8929 Other chronic pain: Secondary | ICD-10-CM | POA: Diagnosis not present

## 2017-02-26 NOTE — Therapy (Signed)
[facility] [address] [place], [place], [address] Phone: [phone]   Fax:  [phone]  Physical Therapy Treatment  Patient Details  Name: Tony Coppericholas Gavigan MRN: [id number] Date of Birth: 03/23/1980 Referring Provider: Katrinka BlazingSmith   Encounter Date: 02/26/2017  PT End of Session - 02/26/17 1140    Visit Number  7    Number of Visits  12    Date for PT Re-Evaluation  03/12/17    Authorization Type  UHC    PT Start Time  1132    PT Stop Time  1220    PT Time Calculation (min)  48 min    Activity Tolerance  Patient tolerated treatment well    Behavior During Therapy  [facility] for tasks assessed/performed       Past Medical History:  Diagnosis Date  . ALLERGIC RHINITIS 09/17/2009  . ASTHMA 09/17/2009  . HYPERLIPIDEMIA 09/17/2009    Past Surgical History:  Procedure Laterality Date  . NASAL SINUS SURGERY Right 12/04/2014   Dr. Jenne PaneBates  . SINUS EXPLORATION  Nov 2014    per Dr. Jenne PaneBates     There were no vitals filed for this visit.  Subjective Assessment - 02/26/17 1138    Subjective  Pt states overall pain improvements. He states increased tightness after sitting, and has had mild pain intermittently.     Currently in Pain?  Yes    Pain Score  1     Pain Location  Back    Pain Descriptors / Indicators  Tightness    Pain Type  Chronic pain    Pain Onset  More than a month ago    Pain Frequency  Intermittent                      [facility] Adult PT Treatment/Exercise - 02/26/17 1140      Lumbar Exercises: Stretches   Passive Hamstring Stretch  --    Single Knee to Chest Stretch  --    Piriformis Stretch  --      Lumbar Exercises: Aerobic   Stationary Bike  L3 x8 min      Lumbar Exercises: Supine   Clam  20 reps    Clam Limitations  alternating with TA and GTB    Bridge with Harley-[employer]sonBall Squeeze  -- x30      Lumbar Exercises: Quadruped   Opposite Arm/Leg Raise  20 reps;Limitations    Opposite Arm/Leg Raise Limitations  x10  LEs/ x10 Both      Knee/Hip Exercises: Standing   Hip Abduction  2 sets;10 reps;Both    Hip Extension  2 sets;10 reps;Both      Knee/Hip Exercises: Supine   Straight Leg Raises  2 sets;10 reps;Both      Manual Therapy   Manual Therapy  Soft tissue mobilization;Myofascial release    Manual therapy comments  --    Soft tissue mobilization  --    Myofascial Release  Lt glute med/max with Hip rotation             PT Education - 02/26/17 1139    Education provided  Yes    Education Details  Reinforced  HEP    Person(s) Educated  Patient    Methods  Explanation    Comprehension  Verbalized understanding       PT Short Term Goals - 02/13/17 0922      PT SHORT TERM GOAL #1   Title  Pt to be independent with  initial HEP     Status  On-going      PT SHORT TERM GOAL #2   Title  Pt to report decreased pain to 3-4/10 with activity and work duties     Status  On-going        PT Long Term Goals - 01/29/17 1553      PT LONG TERM GOAL #1   Title  Pt to report decreased pain in low back to 0-2/10 with sitting, standing, and work duties     Time  6    Period  Weeks    Status  New    Target Date  03/12/17      PT LONG TERM GOAL #2   Title  Pt to demo improved lumbar ROM to be [facility] for pt age, to improve ability for transfers, squatting, and work duties    Time  6    Period  Weeks    Status  New    Target Date  03/12/17      PT LONG TERM GOAL #3   Title  Pt to demo improved LE and core strength to be at least 4+/5 to improve stability and pain.     Time  6    Period  Weeks    Status  New    Target Date  03/12/17            Plan - 02/26/17 1312    Clinical Impression Statement  Pt with decreased pain overall, he does have mild tenderness at L glute and piriformis with deep palpation today, addressed with MFR. He demonstrates weakness in core and hips, and is challenged with stability exercises. Pt to benefit form continued care.     PT Treatment/Interventions   ADLs/Self Care Home Management;Cryotherapy;Electrical Stimulation;Ultrasound;Moist Heat;Gait training;Stair training;Functional mobility training;Therapeutic activities;Therapeutic exercise;Orthotic Fit/Training;Patient/family education;Neuromuscular re-education;Manual techniques;Taping;Dry needling;Passive range of motion    PT Next Visit Plan  assess response to DN, continue stabilization/muscle energy techniques; look at STGs     Consulted and Agree with Plan of Care  Patient       Patient will benefit from skilled therapeutic intervention in order to improve the following deficits and impairments:  Abnormal gait, Pain, Decreased activity tolerance, Decreased endurance, Decreased range of motion, Decreased strength, Hypomobility, Impaired flexibility, Difficulty walking  Visit Diagnosis: Chronic left-sided low back pain without sciatica     Problem List Patient Active Problem List   Diagnosis Date Noted  . Quadriceps tendinitis 11/06/2015  . SI (sacroiliac) joint dysfunction 11/06/2015  . Nonallopathic lesion of sacral region 11/06/2015  . Nonallopathic lesion of lumbosacral region 11/06/2015  . Nonallopathic lesion of thoracic region 11/06/2015  . IBS-D 10/11/2015  . Dyslipidemia 09/17/2009  . Allergic rhinitis 09/17/2009  . Asthma 09/17/2009    Sedalia Muta, PT, DPT 1:13 PM  02/26/17     [facility] [address] [place], [place], [address] Phone: [phone]   Fax:  [phone]  Name: Terry Bolotin MRN: [id number] Date of Birth: 28-Oct-1980

## 2017-03-02 ENCOUNTER — Ambulatory Visit: Payer: 59 | Admitting: Physical Therapy

## 2017-03-02 DIAGNOSIS — G8929 Other chronic pain: Secondary | ICD-10-CM

## 2017-03-02 DIAGNOSIS — M545 Low back pain: Secondary | ICD-10-CM | POA: Diagnosis not present

## 2017-03-03 NOTE — Therapy (Signed)
[facility] [address] [place], [place], [address] Phone: [phone]   Fax:  [phone]  Physical Therapy Treatment  Patient Details  Name: Tony [name] MRN: [id number] Date of Birth: 1980-12-26 Referring Provider: Katrinka Blazing   Encounter Date: 03/02/2017  PT End of Session - 03/02/17 1256    Visit Number  8    Number of Visits  12    Date for PT Re-Evaluation  03/12/17    Authorization Type  UHC    PT Start Time  1224    PT Stop Time  1303    PT Time Calculation (min)  39 min    Activity Tolerance  Patient tolerated treatment well    Behavior During Therapy  [facility] for tasks assessed/performed       Past Medical History:  Diagnosis Date  . ALLERGIC RHINITIS 09/17/2009  . ASTHMA 09/17/2009  . HYPERLIPIDEMIA 09/17/2009    Past Surgical History:  Procedure Laterality Date  . NASAL SINUS SURGERY Right 12/04/2014   Dr. Jenne Pane  . SINUS EXPLORATION  Nov 2014    per Dr. Jenne Pane     There were no vitals filed for this visit.  Subjective Assessment - 03/02/17 1255    Subjective  Pt states mild soreness over the weekend. Pain usually relieved with tylenol or heating pad.     Currently in Pain?  Yes    Pain Score  1     Pain Location  Back    Pain Orientation  Left    Pain Descriptors / Indicators  Tightness    Pain Type  Chronic pain    Pain Onset  More than a month ago    Pain Frequency  Intermittent                      [facility] Adult PT Treatment/Exercise - 03/03/17 1127      Lumbar Exercises: Stretches   Other Lumbar Stretch Exercise  childs pose 30 [place] x 3      Lumbar Exercises: Aerobic   Stationary Bike  L3 x8 min      Lumbar Exercises: Standing   Other Standing Lumbar Exercises  QL stretch (L) 30 sec x3      Lumbar Exercises: Supine   Clam  20 reps    Clam Limitations  alternating with TA and GTB      Lumbar Exercises: Quadruped   Opposite Arm/Leg Raise  20 reps;Limitations      Knee/Hip Exercises:  Standing   Hip Abduction  2 sets;10 reps;Both    Hip Extension  2 sets;10 reps;Both      Knee/Hip Exercises: Supine   Bridges with Ball Squeeze  20 reps    Straight Leg Raises  2 sets;10 reps;Both             PT Education - 03/02/17 1256    Education provided  Yes    Education Details  HEP, posture with Work uniform/belt     Person(s) Educated  Patient    Methods  Explanation    Comprehension  Verbalized understanding       PT Short Term Goals - 02/13/17 0922      PT SHORT TERM GOAL #1   Title  Pt to be independent with initial HEP     Status  On-going      PT SHORT TERM GOAL #2   Title  Pt to report decreased pain to 3-4/10 with activity and work duties  Status  On-going        PT Long Term Goals - 01/29/17 1553      PT LONG TERM GOAL #1   Title  Pt to report decreased pain in low back to 0-2/10 with sitting, standing, and work duties     Time  6    Period  Weeks    Status  New    Target Date  03/12/17      PT LONG TERM GOAL #2   Title  Pt to demo improved lumbar ROM [facility], to improve ability for transfers, squatting, and work duties    Time  6    Period  Weeks    Status  New    Target Date  03/12/17      PT LONG TERM GOAL #3   Title  Pt to demo improved LE and core strength to be at least 4+/5 to improve stability and pain.     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 He demonstrates ability for correcting standing posture, but has tendency for posterior pelvic tilt with ambulation. Pt improving with stability of core and hips, with decreased postural changes seen and less fatigue, but pt to benefit from continued work on this.     PT Treatment/Interventions  ADLs/Self Care Home Management;Cryotherapy;Electrical  Stimulation;Ultrasound;Moist Heat;Gait training;Stair training;Functional mobility training;Therapeutic activities;Therapeutic exercise;Orthotic Fit/Training;Patient/family education;Neuromuscular re-education;Manual techniques;Taping;Dry needling;Passive range of motion    PT Next Visit Plan  assess response to DN, continue stabilization/muscle energy techniques; look at STGs     Consulted and Agree with Plan of Care  Patient       Patient will benefit from skilled therapeutic intervention in order to improve the following deficits and impairments:  Abnormal gait, Pain, Decreased activity tolerance, Decreased endurance, Decreased range of motion, Decreased strength, Hypomobility, Impaired flexibility, Difficulty walking  Visit Diagnosis: Chronic left-sided low back pain without sciatica     Problem List Patient Active Problem List   Diagnosis Date Noted  . Quadriceps tendinitis 11/06/2015  . SI (sacroiliac) joint dysfunction 11/06/2015  . Nonallopathic lesion of sacral region 11/06/2015  . Nonallopathic lesion of lumbosacral region 11/06/2015  . Nonallopathic lesion of thoracic region 11/06/2015  . IBS-D 10/11/2015  . Dyslipidemia 09/17/2009  . Allergic rhinitis 09/17/2009  . Asthma 09/17/2009   Sedalia MutaLauren Ladrea Holladay, PT, DPT 11:35 AM  03/03/17    [facility] [address] [place], [place], [address] Phone: [phone]   Fax:  [phone]  Name: Tony [name] MRN: [id number] Date of Birth: 07/30/1980

## 2017-03-05 ENCOUNTER — Ambulatory Visit: Payer: 59 | Admitting: Physical Therapy

## 2017-03-05 DIAGNOSIS — M545 Low back pain: Secondary | ICD-10-CM

## 2017-03-05 DIAGNOSIS — G8929 Other chronic pain: Secondary | ICD-10-CM | POA: Diagnosis not present

## 2017-03-05 NOTE — Therapy (Signed)
[facility] [address] [place], [place], [address] Phone: [phone]   Fax:  [phone]  Physical Therapy Treatment  Patient Details  Name: Tony Coppericholas Greener MRN: [id number] Date of Birth: 01/22/1980 Referring Provider: Katrinka BlazingSmith   Encounter Date: 03/05/2017  PT End of Session - 03/05/17 2145    Visit Number  9    Number of Visits  12    Date for PT Re-Evaluation  03/12/17    Authorization Type  UHC    PT Start Time  0848    PT Stop Time  0934    PT Time Calculation (min)  46 min    Activity Tolerance  Patient tolerated treatment well    Behavior During Therapy  [facility] for tasks assessed/performed       Past Medical History:  Diagnosis Date  . ALLERGIC RHINITIS 09/17/2009  . ASTHMA 09/17/2009  . HYPERLIPIDEMIA 09/17/2009    Past Surgical History:  Procedure Laterality Date  . NASAL SINUS SURGERY Right 12/04/2014   Dr. Jenne PaneBates  . SINUS EXPLORATION  Nov 2014    per Dr. Jenne PaneBates     There were no vitals filed for this visit.  Subjective Assessment - 03/05/17 2143    Subjective  Pt states overall improvements. He still has mild soreness at times, but pain is less frequent and less intense.     Limitations  Sitting;Standing;House hold activities;Lifting    Currently in Pain?  Yes    Pain Score  1     Pain Location  Back    Pain Orientation  Left    Pain Descriptors / Indicators  Tightness    Pain Type  Chronic pain    Pain Onset  More than a month ago    Pain Frequency  Intermittent                      [facility] Adult PT Treatment/Exercise - 03/05/17 0856      Lumbar Exercises: Stretches   Other Lumbar Stretch Exercise  childs pose 30 sec x3      Lumbar Exercises: Aerobic   Stationary Bike  L3 x8 min      Lumbar Exercises: Standing   Other Standing Lumbar Exercises  QL stretch (L) 30 sec x3      Lumbar Exercises: Supine   Pelvic Tilt  20 reps    Clam  20 reps    Clam Limitations  alternating with TA and GTB       Lumbar Exercises: Quadruped   Opposite Arm/Leg Raise  20 reps;Limitations      Knee/Hip Exercises: Stretches   Piriformis Stretch  3 reps;30 seconds;Limitations    Piriformis Stretch Limitations  Seated      Knee/Hip Exercises: Standing   Hip Abduction  2 sets;10 reps;Both    Abduction Limitations  YTB    Hip Extension  2 sets;10 reps;Both    Extension Limitations  YTB      Knee/Hip Exercises: Supine   Bridges with Ball Squeeze  --    Straight Leg Raises  2 sets;10 reps;Both      Knee/Hip Exercises: Sidelying   Hip ABduction  2 sets;10 reps;Both             PT Education - 03/05/17 2144    Education provided  Yes    Education Details  HEP, activity progression    Person(s) Educated  Patient    Methods  Explanation    Comprehension  Verbalized understanding  PT Short Term Goals - 02/13/17 1610      PT SHORT TERM GOAL #1   Title  Pt to be independent with initial HEP     Status  On-going      PT SHORT TERM GOAL #2   Title  Pt to report decreased pain to 3-4/10 with activity and work duties     Status  On-going        PT Long Term Goals - 01/29/17 1553      PT LONG TERM GOAL #1   Title  Pt to report decreased pain in low back to 0-2/10 with sitting, standing, and work duties     Time  6    Period  Weeks    Status  New    Target Date  03/12/17      PT LONG TERM GOAL #2   Title  Pt to demo improved lumbar ROM to be [facility] for pt age, to improve ability for transfers, squatting, and work duties    Time  6    Period  Weeks    Status  New    Target Date  03/12/17      PT LONG TERM GOAL #3   Title  Pt to demo improved LE and core strength to be at least 4+/5 to improve stability and pain.     Time  6    Period  Weeks    Status  New    Target Date  03/12/17            Plan - 03/05/17 2146    Clinical Impression Statement  Pt showing improvements in ability for performing ther ex, with increased strength and stability. He has noted weakness  on L side vs R, with increased difficulty and fatigue on L. Plan to continue to progress strength and stability as tolerated.     PT Treatment/Interventions  ADLs/Self Care Home Management;Cryotherapy;Electrical Stimulation;Ultrasound;Moist Heat;Gait training;Stair training;Functional mobility training;Therapeutic activities;Therapeutic exercise;Orthotic Fit/Training;Patient/family education;Neuromuscular re-education;Manual techniques;Taping;Dry needling;Passive range of motion    PT Next Visit Plan  assess response to DN, continue stabilization/muscle energy techniques; look at STGs     Consulted and Agree with Plan of Care  Patient       Patient will benefit from skilled therapeutic intervention in order to improve the following deficits and impairments:  Abnormal gait, Pain, Decreased activity tolerance, Decreased endurance, Decreased range of motion, Decreased strength, Hypomobility, Impaired flexibility, Difficulty walking  Visit Diagnosis: Chronic left-sided low back pain without sciatica     Problem List Patient Active Problem List   Diagnosis Date Noted  . Quadriceps tendinitis 11/06/2015  . SI (sacroiliac) joint dysfunction 11/06/2015  . Nonallopathic lesion of sacral region 11/06/2015  . Nonallopathic lesion of lumbosacral region 11/06/2015  . Nonallopathic lesion of thoracic region 11/06/2015  . IBS-D 10/11/2015  . Dyslipidemia 09/17/2009  . Allergic rhinitis 09/17/2009  . Asthma 09/17/2009   Sedalia Muta, PT, DPT 9:54 PM  03/05/17     [facility] [address] [place], [place], [address] Phone: [phone]   Fax:  [phone]  Name: Tony [name] MRN: [id number] Date of Birth: 1980/01/25

## 2017-03-09 ENCOUNTER — Ambulatory Visit: Payer: 59 | Admitting: Physical Therapy

## 2017-03-09 DIAGNOSIS — M545 Low back pain: Secondary | ICD-10-CM | POA: Diagnosis not present

## 2017-03-09 DIAGNOSIS — G8929 Other chronic pain: Secondary | ICD-10-CM | POA: Diagnosis not present

## 2017-03-09 NOTE — Therapy (Signed)
[facility] [address] [place], [place], [address] Phone: [phone]   Fax:  [phone]  Physical Therapy Treatment/ReCert  Patient Details  Name: Tony [name] MRN: [id number] Date of Birth: 08-25-1980 Referring Provider: Tamala Julian   Encounter Date: 03/09/2017  PT End of Session - 03/09/17 1006    Visit Number  10    Number of Visits  20    Date for PT Re-Evaluation  04/06/17    Authorization Type  UHC    PT Start Time  [phone]    PT Stop Time  0930    PT Time Calculation (min)  44 min    Activity Tolerance  Patient tolerated treatment well    Behavior During Therapy  [facility] for tasks assessed/performed       Past Medical History:  Diagnosis Date  . ALLERGIC RHINITIS 09/17/2009  . ASTHMA 09/17/2009  . HYPERLIPIDEMIA 09/17/2009    Past Surgical History:  Procedure Laterality Date  . NASAL SINUS SURGERY Right 12/04/2014   Dr. Redmond Baseman  . SINUS EXPLORATION  Nov 2014    per Dr. Redmond Baseman     There were no vitals filed for this visit.  Subjective Assessment - 03/09/17 1004    Subjective  Pt states that he has singificant soreness yesterday and today. He "turned wrong" on Saturday, and then worked yesterday. He states increased pain with bending forward and with transfers.     Currently in Pain?  Yes    Pain Score  5     Pain Location  Back    Pain Descriptors / Indicators  Sore    Pain Type  Chronic pain    Pain Onset  More than a month ago    Pain Frequency  Intermittent         [facility] PT Assessment - 03/09/17 0001      AROM   Lumbar Flexion  mild limitation due to pain    Lumbar Extension  wfl    Lumbar - Right Side Bend  wfl    Lumbar - Left Side Bend  wfl      Strength   Overall Strength Comments  L hip: 4/5; R: 4+/5      Flexibility   Hamstrings  Limited bilaterally      Palpation   Palpation comment  Significant pain and tenderness at L SI, L glute med; tightness and limited motion in posterior chain      Special  Tests   Other special tests  Negative LLTT                  [facility] Adult PT Treatment/Exercise - 03/09/17 0901      Lumbar Exercises: Aerobic   Stationary Bike  L2 x8 min      Lumbar Exercises: Standing   Other Standing Lumbar Exercises  QL stretch (L) 30 sec x3      Lumbar Exercises: Supine   Pelvic Tilt  20 reps    Clam  --    Clam Limitations  --      Knee/Hip Exercises: Stretches   Active Hamstring Stretch  3 reps;30 seconds    Piriformis Stretch  3 reps;30 seconds;Limitations    Piriformis Stretch Limitations  supine      Knee/Hip Exercises: Standing   Hip Abduction  --    Abduction Limitations  --    Hip Extension  --    Extension Limitations  --      Knee/Hip Exercises: Sidelying   Hip  ABduction  2 sets;10 reps;Both      Manual Therapy   Manual Therapy  Joint mobilization    Joint Mobilization  Long leg distraction on L; SI mobs;     Soft tissue mobilization  L piriformis and glute med    Passive ROM  MET to increase ant pelvic tilt ; Manual stretching for hip and hamstring     Kinesiotex  -- Star decompression SI taping             PT Education - 03/09/17 1005    Education provided  Yes    Education Details  HEP, pain relief, possible return to MD for manipulation    Person(s) Educated  Patient    Methods  Explanation    Comprehension  Verbalized understanding;Need further instruction;Verbal cues required;Tactile cues required       PT Short Term Goals - 03/09/17 1007      PT SHORT TERM GOAL #1   Title  Pt to be independent with initial HEP     Status  Achieved      PT SHORT TERM GOAL #2   Title  Pt to report decreased pain to 3-4/10 with activity and work duties     Status  Partially Met    Target Date  04/06/17        PT Long Term Goals - 03/09/17 1008      PT LONG TERM GOAL #1   Title  Pt to report decreased pain in low back to 0-2/10 with sitting, standing, and work duties     Time  6    Period  Weeks    Status   Partially Met    Target Date  04/06/17      PT LONG TERM GOAL #2   Title  Pt to demo improved lumbar ROM to be [facility] for pt age, to improve ability for transfers, squatting, and work duties    Time  6    Period  Weeks    Status  Partially Met    Target Date  04/06/17      PT LONG TERM GOAL #3   Title  Pt to demo improved LE and core strength to be at least 4+/5 to improve stability and pain.     Time  6    Period  Weeks    Status  Partially Met    Target Date  04/06/17            Plan - 03/09/17 1009    Clinical Impression Statement  Pt was making good improvements in pain, but has increased soreness today at L SI region. He has partially met LTGs. Last week he demonstrated improved mobility and strength, but today, mobility for lumbar and hip flexion limited, due to pain.  L LE appears slightly shorter than R, but pelvis appears level. He will benefit from continuation of skilled PT, to continue to improve strength of hips and glutes, as well as to reduce pain and meet LTGs. Recommended pt return to MD for follow up as well.     PT Frequency  2x / week    PT Duration  4 weeks    PT Treatment/Interventions  ADLs/Self Care Home Management;Cryotherapy;Electrical Stimulation;Ultrasound;Moist Heat;Gait training;Stair training;Functional mobility training;Therapeutic activities;Therapeutic exercise;Orthotic Fit/Training;Patient/family education;Neuromuscular re-education;Manual techniques;Taping;Dry needling;Passive range of motion    Consulted and Agree with Plan of Care  Patient       Patient will benefit from skilled therapeutic intervention in order to improve the following  deficits and impairments:  Abnormal gait, Pain, Decreased activity tolerance, Decreased endurance, Decreased range of motion, Decreased strength, Hypomobility, Impaired flexibility, Difficulty walking  Visit Diagnosis: Chronic left-sided low back pain without sciatica     Problem List Patient Active Problem  List   Diagnosis Date Noted  . Quadriceps tendinitis 11/06/2015  . SI (sacroiliac) joint dysfunction 11/06/2015  . Nonallopathic lesion of sacral region 11/06/2015  . Nonallopathic lesion of lumbosacral region 11/06/2015  . Nonallopathic lesion of thoracic region 11/06/2015  . IBS-D 10/11/2015  . Dyslipidemia 09/17/2009  . Allergic rhinitis 09/17/2009  . Asthma 09/17/2009    Lyndee Hensen, PT, DPT 10:15 AM  03/09/17   [facility] [place], [place], [address] Phone: [phone]   Fax:  [phone]  Name: Rishith Siddoway MRN: [id number] Date of Birth: [birth year]

## 2017-03-11 DIAGNOSIS — J3081 Allergic rhinitis due to animal (cat) (dog) hair and dander: Secondary | ICD-10-CM | POA: Diagnosis not present

## 2017-03-11 DIAGNOSIS — J3089 Other allergic rhinitis: Secondary | ICD-10-CM | POA: Diagnosis not present

## 2017-03-11 DIAGNOSIS — J301 Allergic rhinitis due to pollen: Secondary | ICD-10-CM | POA: Diagnosis not present

## 2017-03-12 ENCOUNTER — Ambulatory Visit: Payer: 59 | Admitting: Physical Therapy

## 2017-03-12 ENCOUNTER — Encounter: Payer: Self-pay | Admitting: Physical Therapy

## 2017-03-12 DIAGNOSIS — G8929 Other chronic pain: Secondary | ICD-10-CM

## 2017-03-12 DIAGNOSIS — M545 Low back pain, unspecified: Secondary | ICD-10-CM

## 2017-03-12 NOTE — Therapy (Signed)
[facility] [address] [place], [place], [address] Phone: [phone]   Fax:  [phone]  Physical Therapy Treatment  Patient Details  Name: Tony [name] MRN: [id number] Date of Birth: [birth year] Referring Provider: Tamala Julian   Encounter Date: 03/12/2017  PT End of Session - 03/12/17 1116    Visit Number  11    Number of Visits  20    Date for PT Re-Evaluation  04/06/17    Authorization Type  UHC    PT Start Time  0931    PT Stop Time  1017    PT Time Calculation (min)  46 min    Activity Tolerance  Patient tolerated treatment well    Behavior During Therapy  [facility] for tasks assessed/performed       Past Medical History:  Diagnosis Date  . ALLERGIC RHINITIS 09/17/2009  . ASTHMA 09/17/2009  . HYPERLIPIDEMIA 09/17/2009    Past Surgical History:  Procedure Laterality Date  . NASAL SINUS SURGERY Right 12/04/2014   Dr. Redmond Baseman  . SINUS EXPLORATION  Nov 2014    per Dr. Redmond Baseman     There were no vitals filed for this visit.  Subjective Assessment - 03/12/17 0937    Subjective  Pt states improvements in pain since last visit, but is still sore today.     Currently in Pain?  Yes    Pain Score  3     Pain Location  Back    Pain Orientation  Left    Pain Descriptors / Indicators  Sore    Pain Type  Chronic pain    Pain Onset  More than a month ago    Pain Frequency  Intermittent         [facility] PT Assessment - 03/12/17 0940      AROM   Lumbar Flexion  mild limitation due to pain    Lumbar Extension  wfl    Lumbar - Right Side Bend  wfl    Lumbar - Left Side Bend  wfl      Strength   Overall Strength Comments  L hip: 4/5; R: 4+/5      Flexibility   Hamstrings  Limited bilaterally      Palpation   Palpation comment  Significant pain and tenderness at L SI, L glute med; tightness and limited motion in posterior chain      Special Tests   Other special tests  Negative LLTT                  [facility] Adult PT  Treatment/Exercise - 03/12/17 0940      Lumbar Exercises: Stretches   Other Lumbar Stretch Exercise  childs pose 30 sec x3      Lumbar Exercises: Aerobic   Stationary Bike  L3 x8 min      Lumbar Exercises: Standing   Other Standing Lumbar Exercises  QL stretch (L) 30 sec x3      Lumbar Exercises: Supine   Pelvic Tilt  20 reps      Lumbar Exercises: Quadruped   [place]  20 reps    Opposite Arm/Leg Raise  20 reps;Limitations    Opposite Arm/Leg Raise Limitations  LEs only      Knee/Hip Exercises: Stretches   Active Hamstring Stretch  3 reps;30 seconds    Piriformis Stretch  3 reps;30 seconds;Limitations    Piriformis Stretch Limitations  Seated      Knee/Hip Exercises: Standing   Other Standing Knee Exercises  Walk/March 35 ft x6      Knee/Hip Exercises: Sidelying   Hip ABduction  --      Manual Therapy   Manual Therapy  Joint mobilization    Joint Mobilization  Anterior hip mobs    Soft tissue mobilization  DTM/ IASTM to L piriformis and glute med and L paraspinals    Passive ROM  For hip extension on L     Kinesiotex  -- Star decompression SI taping             PT Education - 03/12/17 0940    Education provided  Yes    Education Details  HEP     Person(s) Educated  Patient    Methods  Explanation    Comprehension  Verbalized understanding;Need further instruction;Verbal cues required       PT Short Term Goals - 03/09/17 1007      PT SHORT TERM GOAL #1   Title  Pt to be independent with initial HEP     Status  Achieved      PT SHORT TERM GOAL #2   Title  Pt to report decreased pain to 3-4/10 with activity and work duties     Status  Partially Met    Target Date  04/06/17        PT Long Term Goals - 03/09/17 1008      PT LONG TERM GOAL #1   Title  Pt to report decreased pain in low back to 0-2/10 with sitting, standing, and work duties     Time  6    Period  Weeks    Status  Partially Met    Target Date  04/06/17      PT LONG TERM  GOAL #2   Title  Pt to demo improved lumbar ROM to be [facility] for pt age, to improve ability for transfers, squatting, and work duties    Time  6    Period  Weeks    Status  Partially Met    Target Date  04/06/17      PT LONG TERM GOAL #3   Title  Pt to demo improved LE and core strength to be at least 4+/5 to improve stability and pain.     Time  6    Period  Weeks    Status  Partially Met    Target Date  04/06/17            Plan - 03/12/17 1117    Clinical Impression Statement  Pt with tightness in L Lumbar paraspinals today, and soreness in L glute, addressed with manual therpay, IASTM and taping. Pt with decreased pain today since last session. Plan to progress stabilization as tolerated. DIscussed standing posture, pt with tendency for locking knees/hyperextension and posterior pelvic tilt. Pt seeing MD next week.     PT Frequency  2x / week    PT Duration  4 weeks    PT Treatment/Interventions  ADLs/Self Care Home Management;Cryotherapy;Electrical Stimulation;Ultrasound;Moist Heat;Gait training;Stair training;Functional mobility training;Therapeutic activities;Therapeutic exercise;Orthotic Fit/Training;Patient/family education;Neuromuscular re-education;Manual techniques;Taping;Dry needling;Passive range of motion    Consulted and Agree with Plan of Care  Patient       Patient will benefit from skilled therapeutic intervention in order to improve the following deficits and impairments:  Abnormal gait, Pain, Decreased activity tolerance, Decreased endurance, Decreased range of motion, Decreased strength, Hypomobility, Impaired flexibility, Difficulty walking  Visit Diagnosis: Chronic left-sided low back pain without sciatica     Problem List Patient  Active Problem List   Diagnosis Date Noted  . Quadriceps tendinitis 11/06/2015  . SI (sacroiliac) joint dysfunction 11/06/2015  . Nonallopathic lesion of sacral region 11/06/2015  . Nonallopathic lesion of lumbosacral region  11/06/2015  . Nonallopathic lesion of thoracic region 11/06/2015  . IBS-D 10/11/2015  . Dyslipidemia 09/17/2009  . Allergic rhinitis 09/17/2009  . Asthma 09/17/2009   Lyndee Hensen, PT, DPT 11:19 AM  03/12/17    [facility] [place], [place], [address] Phone: [phone]   Fax:  [phone]  Name: Tony [name] MRN: [id number] Date of Birth: [birth year]

## 2017-03-16 ENCOUNTER — Ambulatory Visit: Payer: 59 | Admitting: Physical Therapy

## 2017-03-16 ENCOUNTER — Encounter: Payer: Self-pay | Admitting: Physical Therapy

## 2017-03-16 DIAGNOSIS — M545 Low back pain: Secondary | ICD-10-CM | POA: Diagnosis not present

## 2017-03-16 DIAGNOSIS — G8929 Other chronic pain: Secondary | ICD-10-CM | POA: Diagnosis not present

## 2017-03-16 NOTE — Therapy (Addendum)
[facility] [address] [place], [place], [address] Phone: [phone]   Fax:  [phone]  Physical Therapy Treatment  Patient Details  Name: Tony [name] MRN: [id number] Date of Birth: [birth year] Referring Provider: Tamala Julian   Encounter Date: 03/16/2017  PT End of Session - 03/16/17 0848    Visit Number  12    Number of Visits  20    Date for PT Re-Evaluation  04/06/17    Authorization Type  [employer]    PT Start Time  0844    PT Stop Time  0925    PT Time Calculation (min)  41 min    Activity Tolerance  Patient tolerated treatment well    Behavior During Therapy  [facility] for tasks assessed/performed       Past Medical History:  Diagnosis Date  . ALLERGIC RHINITIS 09/17/2009  . ASTHMA 09/17/2009  . HYPERLIPIDEMIA 09/17/2009    Past Surgical History:  Procedure Laterality Date  . NASAL SINUS SURGERY Right 12/04/2014   Dr. Redmond Baseman  . SINUS EXPLORATION  Nov 2014    per Dr. Redmond Baseman     There were no vitals filed for this visit.  Subjective Assessment - 03/16/17 0840    Subjective  Pt states improved soreness,  minimal pain today.     Currently in Pain?  No/denies    Pain Score  0-No pain                      [facility] Adult PT Treatment/Exercise - 03/16/17 0848      Lumbar Exercises: Aerobic   Stationary Bike  L3 x8 min      Lumbar Exercises: Standing   Other Standing Lumbar Exercises  QL stretch (L) 30 sec x3      Lumbar Exercises: Supine   Pelvic Tilt  20 reps    Clam  20 reps    Clam Limitations  alternating with TA and GTB      Lumbar Exercises: Quadruped   Madcat/Old Horse  20 reps    Opposite Arm/Leg Raise  20 reps;Limitations    Opposite Arm/Leg Raise Limitations  --      Knee/Hip Exercises: Stretches   Active Hamstring Stretch  --    Piriformis Stretch  --    Piriformis Stretch Limitations  --      Knee/Hip Exercises: Standing   Other Standing Knee Exercises  Walk/March 35 ft x6    Other Standing  Knee Exercises  Squat walk, with T-band at thighs 35 ft x 6      Knee/Hip Exercises: Supine   Straight Leg Raises  2 sets;10 reps;Both      Knee/Hip Exercises: Sidelying   Hip ABduction  2 sets;10 reps;Both      Manual Therapy   Manual Therapy  --    Joint Mobilization  --    Soft tissue mobilization  --    Passive ROM  --    Kinesiotex  --             PT Education - 03/16/17 0847    Education provided  Yes    Education Details  Activity modification and progression, HEP.     Person(s) Educated  Patient    Methods  Explanation    Comprehension  Verbalized understanding       PT Short Term Goals - 03/09/17 1007      PT SHORT TERM GOAL #1   Title  Pt to be independent with initial  HEP     Status  Achieved      PT SHORT TERM GOAL #2   Title  Pt to report decreased pain to 3-4/10 with activity and work duties     Status  Partially Met    Target Date  04/06/17        PT Long Term Goals - 03/09/17 1008      PT LONG TERM GOAL #1   Title  Pt to report decreased pain in low back to 0-2/10 with sitting, standing, and work duties     Time  6    Period  Weeks    Status  Partially Met    Target Date  04/06/17      PT LONG TERM GOAL #2   Title  Pt to demo improved lumbar ROM to be [facility] for pt age, to improve ability for transfers, squatting, and work duties    Time  6    Period  Weeks    Status  Partially Met    Target Date  04/06/17      PT LONG TERM GOAL #3   Title  Pt to demo improved LE and core strength to be at least 4+/5 to improve stability and pain.     Time  6    Period  Weeks    Status  Partially Met    Target Date  04/06/17            Plan - 03/16/17 0942    Clinical Impression Statement  Pt with improving pain and soreness at L SI today. He was able to perform strengthening exercises, wiht fatigue on l vs R, but no increased pain. Discussed increasing strengthening with HEP. Pt to see MD for follow up on Friday. Will decrease frequency to  1x/wk , possible d/c in next 1-2 weeks. Discussed footwear recommendations today.     PT Frequency  2x / week    PT Duration  4 weeks    PT Treatment/Interventions  ADLs/Self Care Home Management;Cryotherapy;Electrical Stimulation;Ultrasound;Moist Heat;Gait training;Stair training;Functional mobility training;Therapeutic activities;Therapeutic exercise;Orthotic Fit/Training;Patient/family education;Neuromuscular re-education;Manual techniques;Taping;Dry needling;Passive range of motion    Consulted and Agree with Plan of Care  Patient       Patient will benefit from skilled therapeutic intervention in order to improve the following deficits and impairments:  Abnormal gait, Pain, Decreased activity tolerance, Decreased endurance, Decreased range of motion, Decreased strength, Hypomobility, Impaired flexibility, Difficulty walking  Visit Diagnosis: Chronic left-sided low back pain without sciatica     Problem List Patient Active Problem List   Diagnosis Date Noted  . Quadriceps tendinitis 11/06/2015  . SI (sacroiliac) joint dysfunction 11/06/2015  . Nonallopathic lesion of sacral region 11/06/2015  . Nonallopathic lesion of lumbosacral region 11/06/2015  . Nonallopathic lesion of thoracic region 11/06/2015  . IBS-D 10/11/2015  . Dyslipidemia 09/17/2009  . Allergic rhinitis 09/17/2009  . Asthma 09/17/2009   Lyndee Hensen, PT, DPT 9:44 AM  03/16/17    [facility] [place], [place], [address] Phone: [phone]   Fax:  [phone]  Name: Tony [name] MRN: [id number] Date of Birth: [birth year]   PHYSICAL THERAPY DISCHARGE SUMMARY  Visits from Start of Care: 12  Pt saw MD, and feels that he is doing ok at this time, will continue HEP, and d/c from PT  Plan: Patient agrees to discharge.  Patient goals were partially met. Patient is being discharged due to meeting the stated rehab goals.  ?????  Lyndee Hensen,  PT, DPT 9:57 AM  04/01/17

## 2017-03-17 DIAGNOSIS — J3089 Other allergic rhinitis: Secondary | ICD-10-CM | POA: Diagnosis not present

## 2017-03-17 DIAGNOSIS — J301 Allergic rhinitis due to pollen: Secondary | ICD-10-CM | POA: Diagnosis not present

## 2017-03-17 DIAGNOSIS — J3081 Allergic rhinitis due to animal (cat) (dog) hair and dander: Secondary | ICD-10-CM | POA: Diagnosis not present

## 2017-03-18 NOTE — Progress Notes (Signed)
[name] [address] [place], [place] [address] Phone: [phone] Subjective:    I'm seeing this patient by the request  of:    CC: Back pain follow-up  GEX:[id number]  Tony [name] is a 37 y.o. male coming in with complaint of back pain.  Patient has had difficulty with sacrum and morbid sacroiliac dysfunction.  Has responded fairly well to osteopathic manipulation.  Last seen 2 months ago.  Patient states that he had a set back last week bending over to clean his dogs paws off. He did have radiating symptoms that went down the medial and lateral side of left thigh to the knee. He was able to go to work and had some pain with working. He went to physical therapy twice last week which help. His pain overall has decreased.     Past Medical History:  Diagnosis Date  . ALLERGIC RHINITIS 09/17/2009  . ASTHMA 09/17/2009  . HYPERLIPIDEMIA 09/17/2009   Past Surgical History:  Procedure Laterality Date  . NASAL SINUS SURGERY Right 12/04/2014   Dr. Jenne Pane  . SINUS EXPLORATION  Nov 2014    per Dr. Jenne Pane    Social History   Socioeconomic History  . Marital status: Married    Spouse name: Not on file  . Number of children: Not on file  . Years of education: Not on file  . Highest education level: Not on file  Social Needs  . Financial resource strain: Not on file  . Food insecurity - worry: Not on file  . Food insecurity - inability: Not on file  . Transportation needs - medical: Not on file  . Transportation needs - non-medical: Not on file  Occupational History  . Not on file  Tobacco Use  . Smoking status: Never Smoker  . Smokeless tobacco: Never Used  Substance and Sexual Activity  . Alcohol use: Yes    Comment: occ  . Drug use: No  . Sexual activity: Not on file  Other Topics Concern  . Not on file  Social History Narrative   Married. 2 children. [name] (May 2016). [name] (July 2013).    Wife works as Emergency planning/management officer since  2012      Hobbies: time with sons, going to gym- [employer] 7 days a week   [name]   No Known Allergies Family History  Problem Relation Age of Onset  . Healthy Mother   . Sinusitis Father        recurrent- sinus surgery multiple. rare fungal infection 20 cases in world.   Marland Kitchen CAD Father        age 41 first heart attack. never smoker  . Alcohol abuse Father   . Depression Father        abused pain pills in past  . Atrial fibrillation Father   . Kidney disease Father        lifestyle/treatment of sinus issues related  . Healthy Brother      Past medical history, social, surgical and family history all reviewed in electronic medical record.  No pertanent information unless stated regarding to the chief complaint.   Review of Systems:Review of systems updated and as accurate as of 03/20/17  No headache, visual changes, nausea, vomiting, diarrhea, constipation, dizziness, abdominal pain, skin rash, fevers, chills, night sweats, weight loss, swollen lymph nodes, body aches, joint swelling, chest pain, shortness of breath, mood changes.  Positive muscle aches  Objective  Blood  pressure 110/84, pulse 77, height 6\' 1"  (1.854 m), weight 201 lb (91.2 kg), SpO2 98 %. Systems examined below as of 03/20/17   General: No apparent distress alert and oriented x3 mood and affect normal, dressed appropriately.  HEENT: Pupils equal, extraocular movements intact  Respiratory: Patient's speak in full sentences and does not appear short of breath  Cardiovascular: No lower extremity edema, non tender, no erythema  Skin: Warm dry intact with no signs of infection or rash on extremities or on axial skeleton.  Abdomen: Soft nontender  Neuro: Cranial nerves II through XII are intact, neurovascularly intact in all extremities with 2+ DTRs and 2+ pulses.  Lymph: No lymphadenopathy of posterior or anterior cervical chain or axillae bilaterally.  Gait normal with good balance and coordination.  MSK:   Non tender with full range of motion and good stability and symmetric strength and tone of shoulders, elbows, wrist, hip, knee and ankles bilaterally.  Back Exam:  Inspection: Mild loss of lordosis Motion: Flexion 45 deg, Extension 25 deg, Side Bending to 35 deg bilaterally,  Rotation to 45 deg bilaterally  SLR laying: Negative  XSLR laying: Negative  Palpable tenderness: Tender to palpation over the paraspinal musculature lumbar spine.  More over the left sacroiliac joint. FABER: Mild tightness in the left. Sensory change: Gross sensation intact to all lumbar and sacral dermatomes.  Reflexes: 2+ at both patellar tendons, 2+ at achilles tendons, Babinski's downgoing.  Strength at foot  Plantar-flexion: 5/5 Dorsi-flexion: 5/5 Eversion: 5/5 Inversion: 5/5  Leg strength  Quad: 5/5 Hamstring: 5/5 Hip flexor: 5/5 Hip abductors: 4/5  Gait unremarkable.  Osteopathic findings C2 flexed rotated and side bent right  T3 extended rotated and side bent right inhaled third rib T9 extended rotated and side bent left L4 flexed rotated and side bent right Sacrum right on right    Impression and Recommendations:     This case required medical decision making of moderate complexity.      Note: This dictation was prepared with Dragon dictation along with smaller phrase technology. Any transcriptional errors that result from this process are unintentional.

## 2017-03-19 DIAGNOSIS — J3089 Other allergic rhinitis: Secondary | ICD-10-CM | POA: Diagnosis not present

## 2017-03-19 DIAGNOSIS — J301 Allergic rhinitis due to pollen: Secondary | ICD-10-CM | POA: Diagnosis not present

## 2017-03-19 DIAGNOSIS — J3081 Allergic rhinitis due to animal (cat) (dog) hair and dander: Secondary | ICD-10-CM | POA: Diagnosis not present

## 2017-03-20 ENCOUNTER — Ambulatory Visit: Payer: 59 | Admitting: Family Medicine

## 2017-03-20 ENCOUNTER — Encounter: Payer: Self-pay | Admitting: Family Medicine

## 2017-03-20 VITALS — BP 110/84 | HR 77 | Ht 73.0 in | Wt 201.0 lb

## 2017-03-20 DIAGNOSIS — M533 Sacrococcygeal disorders, not elsewhere classified: Secondary | ICD-10-CM | POA: Diagnosis not present

## 2017-03-20 DIAGNOSIS — M999 Biomechanical lesion, unspecified: Secondary | ICD-10-CM | POA: Diagnosis not present

## 2017-03-20 MED ORDER — PREDNISONE 50 MG PO TABS: 50.0000 mg | ORAL_TABLET | Freq: Every day | ORAL | 0 refills | Status: DC

## 2017-03-20 NOTE — Assessment & Plan Note (Signed)
Decision today to treat with OMT was based on Physical Exam  After verbal consent patient was treated with HVLA, ME, FPR techniques in cervical, thoracic, lumbar and sacral areas  Patient tolerated the procedure well with improvement in symptoms  Patient given exercises, stretches and lifestyle modifications  See medications in patient instructions if given  Patient will follow up in 4 weeks 

## 2017-03-20 NOTE — Assessment & Plan Note (Signed)
Patient is doing relatively well overall.  Patient has made progress.  Patient is having mild flare.  Patient given ibuprofen and given a prednisone refill when he is going out of town.  Patient can come back and see me again 2 months

## 2017-03-20 NOTE — Patient Instructions (Signed)
Good to see you  Tony [name] is your friend.  Stay active If pain start Duexis 3 times a day for 6 days  On disney try prednisone daily for 5 days See me again in 2 months

## 2017-03-24 DIAGNOSIS — J3089 Other allergic rhinitis: Secondary | ICD-10-CM | POA: Diagnosis not present

## 2017-03-24 DIAGNOSIS — J3081 Allergic rhinitis due to animal (cat) (dog) hair and dander: Secondary | ICD-10-CM | POA: Diagnosis not present

## 2017-03-24 DIAGNOSIS — J301 Allergic rhinitis due to pollen: Secondary | ICD-10-CM | POA: Diagnosis not present

## 2017-03-26 DIAGNOSIS — J3081 Allergic rhinitis due to animal (cat) (dog) hair and dander: Secondary | ICD-10-CM | POA: Diagnosis not present

## 2017-03-26 DIAGNOSIS — J3089 Other allergic rhinitis: Secondary | ICD-10-CM | POA: Diagnosis not present

## 2017-03-26 DIAGNOSIS — J301 Allergic rhinitis due to pollen: Secondary | ICD-10-CM | POA: Diagnosis not present

## 2017-03-30 DIAGNOSIS — J301 Allergic rhinitis due to pollen: Secondary | ICD-10-CM | POA: Diagnosis not present

## 2017-03-30 DIAGNOSIS — J3081 Allergic rhinitis due to animal (cat) (dog) hair and dander: Secondary | ICD-10-CM | POA: Diagnosis not present

## 2017-03-30 DIAGNOSIS — J3089 Other allergic rhinitis: Secondary | ICD-10-CM | POA: Diagnosis not present

## 2017-04-01 ENCOUNTER — Encounter: Payer: Self-pay | Admitting: Physical Therapy

## 2017-04-01 DIAGNOSIS — J301 Allergic rhinitis due to pollen: Secondary | ICD-10-CM | POA: Diagnosis not present

## 2017-04-01 DIAGNOSIS — J3081 Allergic rhinitis due to animal (cat) (dog) hair and dander: Secondary | ICD-10-CM | POA: Diagnosis not present

## 2017-04-02 DIAGNOSIS — J301 Allergic rhinitis due to pollen: Secondary | ICD-10-CM | POA: Diagnosis not present

## 2017-04-02 DIAGNOSIS — J3081 Allergic rhinitis due to animal (cat) (dog) hair and dander: Secondary | ICD-10-CM | POA: Diagnosis not present

## 2017-04-02 DIAGNOSIS — J3089 Other allergic rhinitis: Secondary | ICD-10-CM | POA: Diagnosis not present

## 2017-04-07 DIAGNOSIS — J3081 Allergic rhinitis due to animal (cat) (dog) hair and dander: Secondary | ICD-10-CM | POA: Diagnosis not present

## 2017-04-07 DIAGNOSIS — J301 Allergic rhinitis due to pollen: Secondary | ICD-10-CM | POA: Diagnosis not present

## 2017-04-07 DIAGNOSIS — J3089 Other allergic rhinitis: Secondary | ICD-10-CM | POA: Diagnosis not present

## 2017-04-09 DIAGNOSIS — J301 Allergic rhinitis due to pollen: Secondary | ICD-10-CM | POA: Diagnosis not present

## 2017-04-09 DIAGNOSIS — J3089 Other allergic rhinitis: Secondary | ICD-10-CM | POA: Diagnosis not present

## 2017-04-09 DIAGNOSIS — J3081 Allergic rhinitis due to animal (cat) (dog) hair and dander: Secondary | ICD-10-CM | POA: Diagnosis not present

## 2017-04-23 DIAGNOSIS — J301 Allergic rhinitis due to pollen: Secondary | ICD-10-CM | POA: Diagnosis not present

## 2017-04-23 DIAGNOSIS — J3089 Other allergic rhinitis: Secondary | ICD-10-CM | POA: Diagnosis not present

## 2017-04-23 DIAGNOSIS — J3081 Allergic rhinitis due to animal (cat) (dog) hair and dander: Secondary | ICD-10-CM | POA: Diagnosis not present

## 2017-04-28 NOTE — Progress Notes (Signed)
[name] [address] [place], [place] [address] Phone: [phone] Subjective:    I'm seeing this patient by the request  of:    CC: Back pain,  [name]  Tony [name] is a 37 y.o. male coming in with complaint of back pain.  Patient has been seen previously.  Complains of more of sacroiliac dysfunction.  Having chronic flare at the moment.  No radicular symptoms but severe sharp and sudden pain.  Seems to be more bilaterally this time than usual.  Rates the severity of pain is 7 out of 10 and not responding to over-the-counter medications.     Past Medical History:  Diagnosis Date  . ALLERGIC RHINITIS 09/17/2009  . ASTHMA 09/17/2009  . HYPERLIPIDEMIA 09/17/2009   Past Surgical History:  Procedure Laterality Date  . NASAL SINUS SURGERY Right 12/04/2014   Dr. Jenne Pane  . SINUS EXPLORATION  Nov 2014    per Dr. Jenne Pane    Social History   Socioeconomic History  . Marital status: Married    Spouse name: Not on file  . Number of children: Not on file  . Years of education: Not on file  . Highest education level: Not on file  Occupational History  . Not on file  Social Needs  . Financial resource strain: Not on file  . Food insecurity:    Worry: Not on file    Inability: Not on file  . Transportation needs:    Medical: Not on file    Non-medical: Not on file  Tobacco Use  . Smoking status: Never Smoker  . Smokeless tobacco: Never Used  Substance and Sexual Activity  . Alcohol use: Yes    Comment: occ  . Drug use: No  . Sexual activity: Not on file  Lifestyle  . Physical activity:    Days per week: Not on file    Minutes per session: Not on file  . Stress: Not on file  Relationships  . Social connections:    Talks on phone: Not on file    Gets together: Not on file    Attends religious service: Not on file    Active member of club or organization: Not on file    Attends meetings of clubs or organizations: Not on file   Relationship status: Not on file  Other Topics Concern  . Not on file  Social History Narrative   Married. 2 children. [name] (May 2016). [name] (July 2013).    Wife works as Emergency planning/management officer since 2012      Hobbies: time with sons, going to gym- [employer] 7 days a week   [name]   No Known Allergies Family History  Problem Relation Age of Onset  . Healthy Mother   . Sinusitis Father        recurrent- sinus surgery multiple. rare fungal infection 20 cases in world.   Marland Kitchen CAD Father        age 47 first heart attack. never smoker  . Alcohol abuse Father   . Depression Father        abused pain pills in past  . Atrial fibrillation Father   . Kidney disease Father        lifestyle/treatment of sinus issues related  . Healthy Brother      Past medical history, social, surgical and family history all reviewed in electronic medical record.  No pertanent information unless stated regarding to the chief  complaint.   Review of Systems:Review of systems updated and as accurate as of 04/29/17  No headache, visual changes, nausea, vomiting, diarrhea, constipation, dizziness, abdominal pain, skin rash, fevers, chills, night sweats, weight loss, swollen lymph nodes, body aches, joint swelling,  chest pain, shortness of breath, mood changes.  Positive muscle aches  Objective  Blood pressure 118/90, pulse (!) 105, height 6\' 1"  (1.854 m), weight 199 lb (90.3 kg), SpO2 98 %. Systems examined below as of 04/29/17   General: No apparent distress alert and oriented x3 mood and affect normal, dressed appropriately.  HEENT: Pupils equal, extraocular movements intact  Respiratory: Patient's speak in full sentences and does not appear short of breath  Cardiovascular: No lower extremity edema, non tender, no erythema  Skin: Warm dry intact with no signs of infection or rash on extremities or on axial skeleton.  Abdomen: Soft nontender  Neuro: Cranial nerves II through XII are intact,  neurovascularly intact in all extremities with 2+ DTRs and 2+ pulses.  Lymph: No lymphadenopathy of posterior or anterior cervical chain or axillae bilaterally.  Gait normal with good balance and coordination.  MSK:  Non tender with full range of motion and good stability and symmetric strength and tone of shoulders, elbows, wrist, hip, knee and ankles bilaterally.  Back Exam:  Inspection: Mild loss of lordosis Motion: Flexion 35 deg, Extension 25 deg, Side Bending to 35 deg bilaterally,  Rotation to 45 deg bilaterally  SLR laying: Negative  XSLR laying: Negative  Palpable tenderness: Tender to palpation of the paraspinal musculature lumbar spine. FABER: Tightness bilaterally. Sensory change: Gross sensation intact to all lumbar and sacral dermatomes.  Reflexes: 2+ at both patellar tendons, 2+ at achilles tendons, Babinski's downgoing.  Strength at foot  Plantar-flexion: 5/5 Dorsi-flexion: 5/5 Eversion: 5/5 Inversion: 5/5  Leg strength  Quad: 5/5 Hamstring: 5/5 Hip flexor: 5/5 Hip abductors: 4/5  But symmetric Gait unremarkable.  Osteopathic findings T8 extended rotated and side bent left L2 flexed rotated and side bent right Sacrum right on right  Impression and Recommendations:     This case required medical decision making of moderate complexity.      Note: This dictation was prepared with Dragon dictation along with smaller phrase technology. Any transcriptional errors that result from this process are unintentional.

## 2017-04-29 ENCOUNTER — Encounter: Payer: Self-pay | Admitting: Family Medicine

## 2017-04-29 ENCOUNTER — Ambulatory Visit: Payer: 59 | Admitting: Family Medicine

## 2017-04-29 VITALS — BP 118/90 | HR 105 | Ht 73.0 in | Wt 199.0 lb

## 2017-04-29 DIAGNOSIS — M533 Sacrococcygeal disorders, not elsewhere classified: Secondary | ICD-10-CM | POA: Diagnosis not present

## 2017-04-29 DIAGNOSIS — M999 Biomechanical lesion, unspecified: Secondary | ICD-10-CM

## 2017-04-29 MED ORDER — KETOROLAC TROMETHAMINE 60 MG/2ML IM SOLN
60.0000 mg | Freq: Once | INTRAMUSCULAR | Status: AC
  Administered 2017-04-29: 60 mg via INTRAMUSCULAR

## 2017-04-29 MED ORDER — METHYLPREDNISOLONE ACETATE 80 MG/ML IJ SUSP
80.0000 mg | Freq: Once | INTRAMUSCULAR | Status: AC
  Administered 2017-04-29: 80 mg via INTRAMUSCULAR

## 2017-04-29 NOTE — Patient Instructions (Signed)
Good to see you  Tony [name] is your friend.  Stay active Keep stretching  If pain worsens take the prednisone  2 injections today  See em again in 3-4 weeks

## 2017-04-29 NOTE — Assessment & Plan Note (Signed)
Decision today to treat with OMT was based on Physical Exam  After verbal consent patient was treated with HVLA, ME, FPR techniques in  thoracic, lumbar and sacral areas  Patient tolerated the procedure well with improvement in symptoms  Patient given exercises, stretches and lifestyle modifications  See medications in patient instructions if given  Patient will follow up in 3-4 weeks 

## 2017-04-29 NOTE — Assessment & Plan Note (Signed)
Patient does have more sacroiliac dysfunction.  Discussed with patient in great length and icing regimen and home exercise.  Discussed with activities in which he wants to avoid.  Patient is to take prednisone needed.  Given 2 injections today secondary to the exacerbation.  Follow-up with him in 3 weeks.

## 2017-05-14 DIAGNOSIS — J301 Allergic rhinitis due to pollen: Secondary | ICD-10-CM | POA: Diagnosis not present

## 2017-05-14 DIAGNOSIS — J3081 Allergic rhinitis due to animal (cat) (dog) hair and dander: Secondary | ICD-10-CM | POA: Diagnosis not present

## 2017-05-14 DIAGNOSIS — J3089 Other allergic rhinitis: Secondary | ICD-10-CM | POA: Diagnosis not present

## 2017-05-19 DIAGNOSIS — J3081 Allergic rhinitis due to animal (cat) (dog) hair and dander: Secondary | ICD-10-CM | POA: Diagnosis not present

## 2017-05-19 DIAGNOSIS — J301 Allergic rhinitis due to pollen: Secondary | ICD-10-CM | POA: Diagnosis not present

## 2017-05-19 DIAGNOSIS — J3089 Other allergic rhinitis: Secondary | ICD-10-CM | POA: Diagnosis not present

## 2017-05-19 NOTE — Progress Notes (Signed)
[name] [address] [place], [place] [address] Phone: [phone] Subjective:     CC: Back pain.  [name]  Tony [name] is a 37 y.o. male coming in with complaint of back pain. He still has pain with prolonged sitting but otherwise has been doing well since last visit. Did not use prednisone. Did use roll on salonpas during his vacation which helped alleviate some pain in his back.       Past Medical History:  Diagnosis Date  . ALLERGIC RHINITIS 09/17/2009  . ASTHMA 09/17/2009  . HYPERLIPIDEMIA 09/17/2009   Past Surgical History:  Procedure Laterality Date  . NASAL SINUS SURGERY Right 12/04/2014   Dr. Jenne Pane  . SINUS EXPLORATION  Nov 2014    per Dr. Jenne Pane    Social History   Socioeconomic History  . Marital status: Married    Spouse name: Not on file  . Number of children: Not on file  . Years of education: Not on file  . Highest education level: Not on file  Occupational History  . Not on file  Social Needs  . Financial resource strain: Not on file  . Food insecurity:    Worry: Not on file    Inability: Not on file  . Transportation needs:    Medical: Not on file    Non-medical: Not on file  Tobacco Use  . Smoking status: Never Smoker  . Smokeless tobacco: Never Used  Substance and Sexual Activity  . Alcohol use: Yes    Comment: occ  . Drug use: No  . Sexual activity: Not on file  Lifestyle  . Physical activity:    Days per week: Not on file    Minutes per session: Not on file  . Stress: Not on file  Relationships  . Social connections:    Talks on phone: Not on file    Gets together: Not on file    Attends religious service: Not on file    Active member of club or organization: Not on file    Attends meetings of clubs or organizations: Not on file    Relationship status: Not on file  Other Topics Concern  . Not on file  Social History Narrative   Married. 2 children. [name] (May 2016). [name] (July 2013).     Wife works as Emergency planning/management officer since 2012      Hobbies: time with sons, going to gym- [employer] 7 days a week   [name]   No Known Allergies Family History  Problem Relation Age of Onset  . Healthy Mother   . Sinusitis Father        recurrent- sinus surgery multiple. rare fungal infection 20 cases in world.   Marland Kitchen CAD Father        age 30 first heart attack. never smoker  . Alcohol abuse Father   . Depression Father        abused pain pills in past  . Atrial fibrillation Father   . Kidney disease Father        lifestyle/treatment of sinus issues related  . Healthy Brother      Past medical history, social, surgical and family history all reviewed in electronic medical record.  No pertanent information unless stated regarding to the chief complaint.   Review of Systems:Review of systems updated and as accurate as of 05/20/17  No headache, visual changes, nausea, vomiting, diarrhea, constipation, dizziness, abdominal pain, skin  rash, fevers, chills, night sweats, weight loss, swollen lymph nodes, body aches, joint swelling, chest pain, shortness of breath, mood changes.  Positive muscle aches  Objective  Blood pressure 112/80, pulse 66, height  (1.854 m), weight 199 lb (90.3 kg), SpO2 94 %. Systems examined below as of 05/20/17   General: No apparent distress alert and oriented x3 mood and affect normal, dressed appropriately.  HEENT: Pupils equal, extraocular movements intact  Respiratory: Patient's speak in full sentences and does not appear short of breath  Cardiovascular: No lower extremity edema, non tender, no erythema  Skin: Warm dry intact with no signs of infection or rash on extremities or on axial skeleton.  Abdomen: Soft nontender  Neuro: Cranial nerves II through XII are intact, neurovascularly intact in all extremities with 2+ DTRs and 2+ pulses.  Lymph: No lymphadenopathy of posterior or anterior cervical chain or axillae bilaterally.  Gait  normal with good balance and coordination.  MSK:  Non tender with full range of motion and good stability and symmetric strength and tone of shoulders, elbows, wrist, hip, knee and ankles bilaterally.  Back Exam:  Inspection: Loss of lordosis Motion: Flexion 45 deg, Extension 25 deg, Side Bending to 35 deg bilaterally, Rotation to 35 deg bilaterally  SLR laying: Negative  XSLR laying: Negative  Palpable tenderness: Tender to palpation the paraspinal musculature.Marland Kitchen FABER: Positive Faber bilaterally. Sensory change: Gross sensation intact to all lumbar and sacral dermatomes.  Reflexes: 2+ at both patellar tendons, 2+ at achilles tendons, Babinski's downgoing.  Strength at foot  Plantar-flexion: 5/5 Dorsi-flexion: 5/5 Eversion: 5/5 Inversion: 5/5  Leg strength  Quad: 5/5 Hamstring: 5/5 Hip flexor: 5/5 Hip abductors: 5/5  Gait unremarkable.  Osteopathic findings C2 flexed rotated and side bent right C4 flexed rotated and side bent left C6 flexed rotated and side bent right  T3 extended rotated and side bent right inhaled third rib L3 flexed rotated and side bent right Sacrum right on right     Impression and Recommendations:     This case required medical decision making of moderate complexity.      Note: This dictation was prepared with Dragon dictation along with smaller phrase technology. Any transcriptional errors that result from this process are unintentional.

## 2017-05-20 ENCOUNTER — Ambulatory Visit: Payer: 59 | Admitting: Family Medicine

## 2017-05-20 ENCOUNTER — Encounter: Payer: Self-pay | Admitting: Family Medicine

## 2017-05-20 VITALS — BP 112/80 | HR 66 | Ht 73.0 in | Wt 199.0 lb

## 2017-05-20 DIAGNOSIS — M533 Sacrococcygeal disorders, not elsewhere classified: Secondary | ICD-10-CM

## 2017-05-20 DIAGNOSIS — M999 Biomechanical lesion, unspecified: Secondary | ICD-10-CM | POA: Diagnosis not present

## 2017-05-20 NOTE — Assessment & Plan Note (Addendum)
Decision today to treat with OMT was based on Physical Exam  After verbal consent patient was treated with HVLA, ME, FPR techniques in  thoracic, lumbar and sacral areas  Patient tolerated the procedure well with improvement in symptoms  Patient given exercises, stretches and lifestyle modifications  See medications in patient instructions if given  Patient will follow up in 4-8 weeks 

## 2017-05-20 NOTE — Patient Instructions (Addendum)
Good to see you! Keep it up  Stay active Glad you had a great time See me again in 5-6 weeks

## 2017-05-20 NOTE — Assessment & Plan Note (Signed)
Discussed posture and ergonomics.  Discussed which activities of doing which wants to avoid.  We discussed avoiding certain activities.  We discussed once again about changing positions with sitting and proper ergonomics.  Patient will follow-up with me again in 4 to 8 weeks

## 2017-05-21 DIAGNOSIS — J301 Allergic rhinitis due to pollen: Secondary | ICD-10-CM | POA: Diagnosis not present

## 2017-05-21 DIAGNOSIS — J3089 Other allergic rhinitis: Secondary | ICD-10-CM | POA: Diagnosis not present

## 2017-05-21 DIAGNOSIS — J3081 Allergic rhinitis due to animal (cat) (dog) hair and dander: Secondary | ICD-10-CM | POA: Diagnosis not present

## 2017-05-25 ENCOUNTER — Encounter: Payer: 59 | Admitting: Family Medicine

## 2017-05-27 ENCOUNTER — Encounter: Payer: Self-pay | Admitting: Physician Assistant

## 2017-05-27 ENCOUNTER — Ambulatory Visit: Payer: 59 | Admitting: Physician Assistant

## 2017-05-27 VITALS — BP 130/88 | HR 88 | Temp 99.0°F | Ht 73.0 in | Wt 198.5 lb

## 2017-05-27 DIAGNOSIS — J0191 Acute recurrent sinusitis, unspecified: Secondary | ICD-10-CM | POA: Diagnosis not present

## 2017-05-27 DIAGNOSIS — J019 Acute sinusitis, unspecified: Secondary | ICD-10-CM | POA: Diagnosis not present

## 2017-05-27 MED ORDER — METHYLPREDNISOLONE 4 MG PO TBPK: ORAL_TABLET | ORAL | 0 refills | Status: DC

## 2017-05-27 MED ORDER — LEVOFLOXACIN 500 MG PO TABS: 500.0000 mg | ORAL_TABLET | Freq: Every day | ORAL | 0 refills | Status: AC

## 2017-05-27 NOTE — Progress Notes (Signed)
Tony [name] is a 37 y.o. male here for a new problem.  I acted as a Neurosurgeon for [employer], PA-C Corky Mull, LPN  History of Present Illness:   Chief Complaint  Patient presents with  . Sinus Problem    Sinus Problem  This is a new problem. Episode onset: started on Monday. The problem has been gradually worsening since onset. There has been no fever. His pain is at a severity of 5/10. The pain is moderate. Associated symptoms include chills, coughing (expectorating clear sputum), headaches, sinus pressure and sneezing. Pertinent negatives include no hoarse voice, neck pain, shortness of breath, sore throat or swollen glands. (Nasal congestion yellow / brown drainage.) Past treatments include saline sprays and acetaminophen. The treatment provided mild relief.   He has a significant history of sinusitis, has had multiple surgeries to clean out his sinuses.  Most recent surgery was in 2016.  He does not tolerate Augmentin, and usually requires strong antibiotic such as Levaquin.  He sees Dr. Jenne Pane as needed.  He also can tolerate Medrol if needed.  He reports that prednisone causes intense hiccuping.   Past Medical History:  Diagnosis Date  . ALLERGIC RHINITIS 09/17/2009  . ASTHMA 09/17/2009  . HYPERLIPIDEMIA 09/17/2009     Social History   Socioeconomic History  . Marital status: Married    Spouse name: Not on file  . Number of children: Not on file  . Years of education: Not on file  . Highest education level: Not on file  Occupational History  . Not on file  Social Needs  . Financial resource strain: Not on file  . Food insecurity:    Worry: Not on file    Inability: Not on file  . Transportation needs:    Medical: Not on file    Non-medical: Not on file  Tobacco Use  . Smoking status: Never Smoker  . Smokeless tobacco: Never Used  Substance and Sexual Activity  . Alcohol use: Yes    Comment: occ  . Drug use: No  . Sexual activity: Not on file  Lifestyle   . Physical activity:    Days per week: Not on file    Minutes per session: Not on file  . Stress: Not on file  Relationships  . Social connections:    Talks on phone: Not on file    Gets together: Not on file    Attends religious service: Not on file    Active member of club or organization: Not on file    Attends meetings of clubs or organizations: Not on file    Relationship status: Not on file  . Intimate partner violence:    Fear of current or ex partner: Not on file    Emotionally abused: Not on file    Physically abused: Not on file    Forced sexual activity: Not on file  Other Topics Concern  . Not on file  Social History Narrative   Married. 2 children. [name] (May 2016). [name] (July 2013).    Wife works as Emergency planning/management officer since 2012      Hobbies: time with sons, going to gym- [employer] 7 days a week   [name]    Past Surgical History:  Procedure Laterality Date  . NASAL SINUS SURGERY Right 12/04/2014   Dr. Jenne Pane  . SINUS EXPLORATION  Nov 2014    per Dr. Jenne Pane     Family History  Problem Relation Age  of Onset  . Healthy Mother   . Sinusitis Father        recurrent- sinus surgery multiple. rare fungal infection 20 cases in world.   Marland Kitchen CAD Father        age 101 first heart attack. never smoker  . Alcohol abuse Father   . Depression Father        abused pain pills in past  . Atrial fibrillation Father   . Kidney disease Father        lifestyle/treatment of sinus issues related  . Healthy Brother     No Known Allergies  Current Medications:   Current Outpatient Medications:  .  ARNUITY ELLIPTA 100 MCG/ACT AEPB, , Disp: , Rfl:  .  EPINEPHrine 0.3 mg/0.3 mL IJ SOAJ injection, as directed., Disp: , Rfl:  .  fexofenadine (ALLEGRA) 180 MG tablet, Take 180 mg by mouth daily., Disp: , Rfl:  .  fluticasone (FLONASE) 50 MCG/ACT nasal spray, 2 sprays by Nasal route daily.  , Disp: , Rfl:  .  montelukast (SINGULAIR) 10 MG tablet, TAKE 1 TABLET IN  THE EVENING ONCE A DAY, Disp: , Rfl: 5 .  Olopatadine HCl (PATANASE) 0.6 % SOLN, by Nasal route. 2 sprays to each nostril daily , Disp: , Rfl:  .  albuterol (PROVENTIL HFA;VENTOLIN HFA) 108 (90 Base) MCG/ACT inhaler, Take 2 puffs by mouth every 6 hours as needed (Patient not taking: Reported on 05/27/2017), Disp: 18 Inhaler, Rfl: 0 .  levofloxacin (LEVAQUIN) 500 MG tablet, Take 1 tablet (500 mg total) by mouth daily for 5 days., Disp: 5 tablet, Rfl: 0 .  methylPREDNISolone (MEDROL DOSEPAK) 4 MG TBPK tablet, 6-5-4-3-2-1, Disp: 21 tablet, Rfl: 0   Review of Systems:   Review of Systems  Constitutional: Positive for chills.  HENT: Positive for sinus pressure and sneezing. Negative for hoarse voice and sore throat.   Respiratory: Positive for cough (expectorating clear sputum). Negative for shortness of breath.   Musculoskeletal: Negative for neck pain.  Neurological: Positive for headaches.    Vitals:   Vitals:   05/27/17 1104  BP: 130/88  Pulse: 88  Temp: 99 F (37.2 C)  TempSrc: Oral  SpO2: 98%  Weight: 198 lb 8 oz (90 kg)  Height:  (1.854 m)     Body mass index is 26.19 kg/m.  Physical Exam:   Physical Exam  Constitutional: He appears well-developed. He is cooperative.  Non-toxic appearance. He does not have a sickly appearance. He does not appear ill. No distress.  HENT:  Head: Normocephalic and atraumatic.  Right Ear: Tympanic membrane, external ear and ear canal normal. Tympanic membrane is not erythematous, not retracted and not bulging.  Left Ear: Tympanic membrane, external ear and ear canal normal. Tympanic membrane is not erythematous, not retracted and not bulging.  Nose: Mucosal edema and rhinorrhea present. Right sinus exhibits frontal sinus tenderness. Right sinus exhibits no maxillary sinus tenderness. Left sinus exhibits frontal sinus tenderness. Left sinus exhibits no maxillary sinus tenderness.  Mouth/Throat: Uvula is midline and mucous membranes are  normal. Posterior oropharyngeal erythema present. No posterior oropharyngeal edema. Tonsils are 1+ on the right. Tonsils are 1+ on the left.  Eyes: Conjunctivae and lids are normal.  Neck: Trachea normal.  Cardiovascular: Normal rate, regular rhythm, S1 normal, S2 normal and normal heart sounds.  Pulmonary/Chest: Effort normal and breath sounds normal. He has no decreased breath sounds. He has no wheezes. He has no rhonchi. He has no rales.  Lymphadenopathy:  He has no cervical adenopathy.  Neurological: He is alert.  Skin: Skin is warm, dry and intact.  Psychiatric: He has a normal mood and affect. His speech is normal and behavior is normal.  Nursing note and vitals reviewed.   Assessment and Plan:    Tony [name] was seen today for sinus problem.  Diagnoses and all orders for this visit:  Acute recurrent sinusitis, unspecified location  Other orders -     levofloxacin (LEVAQUIN) 500 MG tablet; Take 1 tablet (500 mg total) by mouth daily for 5 days. -     methylPREDNISolone (MEDROL DOSEPAK) 4 MG TBPK tablet; 6-5-4-3-2-1   No red flags on exam.  Will initiate Levaquin and Medrol dose pack per orders. Discussed taking medications as prescribed. Reviewed return precautions including worsening fever, SOB, worsening cough or other concerns. Push fluids and rest. I recommend that patient follow-up if symptoms worsen or persist despite treatment x 7-10 days, sooner if needed.   . Reviewed expectations re: course of current medical issues. . Discussed self-management of symptoms. . Outlined signs and symptoms indicating need for more acute intervention. . Patient verbalized understanding and all questions were answered. . See orders for this visit as documented in the electronic medical record. . Patient received an After-Visit Summary.  CMA or LPN served as scribe during this visit. History, Physical, and Plan performed by medical provider. Documentation and orders reviewed and attested  to.  Jarold Motto, PA-C

## 2017-05-27 NOTE — Patient Instructions (Signed)
It was great to see you!  Use medication as prescribed: Levaquin and Medrol Dose Pack  Push fluids and get plenty of rest. Please return if you are not improving as expected, or if you have high fevers (>101.5) or difficulty swallowing or worsening productive cough.  Call clinic with questions.  I hope you start feeling better soon!

## 2017-06-11 DIAGNOSIS — J3089 Other allergic rhinitis: Secondary | ICD-10-CM | POA: Diagnosis not present

## 2017-06-11 DIAGNOSIS — J301 Allergic rhinitis due to pollen: Secondary | ICD-10-CM | POA: Diagnosis not present

## 2017-06-11 DIAGNOSIS — J3081 Allergic rhinitis due to animal (cat) (dog) hair and dander: Secondary | ICD-10-CM | POA: Diagnosis not present

## 2017-06-16 DIAGNOSIS — J301 Allergic rhinitis due to pollen: Secondary | ICD-10-CM | POA: Diagnosis not present

## 2017-06-16 DIAGNOSIS — J3081 Allergic rhinitis due to animal (cat) (dog) hair and dander: Secondary | ICD-10-CM | POA: Diagnosis not present

## 2017-06-16 DIAGNOSIS — J3089 Other allergic rhinitis: Secondary | ICD-10-CM | POA: Diagnosis not present

## 2017-06-23 DIAGNOSIS — J301 Allergic rhinitis due to pollen: Secondary | ICD-10-CM | POA: Diagnosis not present

## 2017-06-23 DIAGNOSIS — J3089 Other allergic rhinitis: Secondary | ICD-10-CM | POA: Diagnosis not present

## 2017-06-23 DIAGNOSIS — J3081 Allergic rhinitis due to animal (cat) (dog) hair and dander: Secondary | ICD-10-CM | POA: Diagnosis not present

## 2017-06-25 ENCOUNTER — Encounter: Payer: Self-pay | Admitting: Family Medicine

## 2017-06-25 ENCOUNTER — Ambulatory Visit: Payer: 59 | Admitting: Family Medicine

## 2017-06-25 VITALS — BP 118/82 | HR 82 | Ht 73.0 in | Wt 203.0 lb

## 2017-06-25 DIAGNOSIS — M533 Sacrococcygeal disorders, not elsewhere classified: Secondary | ICD-10-CM

## 2017-06-25 DIAGNOSIS — M999 Biomechanical lesion, unspecified: Secondary | ICD-10-CM

## 2017-06-25 NOTE — Assessment & Plan Note (Signed)
Decision today to treat with OMT was based on Physical Exam  After verbal consent patient was treated with HVLA, ME, FPR techniques in cervical, thoracic, lumbar and sacral areas  Patient tolerated the procedure well with improvement in symptoms  Patient given exercises, stretches and lifestyle modifications  See medications in patient instructions if given  Patient will follow up in 4 weeks 

## 2017-06-25 NOTE — Assessment & Plan Note (Signed)
Discussed posture and ergonomics.  Discussed home exercise.  Discussed proper working Financial controllerergonomics.  Patient does do a lot of sitting.  Patient will follow-up again in 4 weeks

## 2017-06-25 NOTE — Progress Notes (Signed)
Tony [name] [address] [place], [place] [address] Phone: [phone] Subjective:     CC: Back pain follow-up  [name]  Tony [name] is a 37 y.o. male coming in with complaint of back pain.  Patient has responded well to osteopathic manipulation previously.  Still has some tightness but nothing severe.  Feels like he is making progress.  No significant changes in medications.  No radiation down the legs.     Past Medical History:  Diagnosis Date  . ALLERGIC RHINITIS 09/17/2009  . ASTHMA 09/17/2009  . HYPERLIPIDEMIA 09/17/2009   Past Surgical History:  Procedure Laterality Date  . NASAL SINUS SURGERY Right 12/04/2014   Dr. Jenne [name]  . SINUS EXPLORATION  Nov 2014    per Dr. Jenne [name]    Social History   Socioeconomic History  . Marital status: Married    Spouse name: Not on file  . Number of children: Not on file  . Years of education: Not on file  . Highest education level: Not on file  Occupational History  . Not on file  Social Needs  . Financial resource strain: Not on file  . Food insecurity:    Worry: Not on file    Inability: Not on file  . Transportation needs:    Medical: Not on file    Non-medical: Not on file  Tobacco Use  . Smoking status: Never Smoker  . Smokeless tobacco: Never Used  Substance and Sexual Activity  . Alcohol use: Yes    Comment: occ  . Drug use: No  . Sexual activity: Not on file  Lifestyle  . Physical activity:    Days per week: Not on file    Minutes per session: Not on file  . Stress: Not on file  Relationships  . Social connections:    Talks on phone: Not on file    Gets together: Not on file    Attends religious service: Not on file    Active member of club or organization: Not on file    Attends meetings of clubs or organizations: Not on file    Relationship status: Not on file  Other Topics Concern  . Not on file  Social History Narrative   Married. 2 children. Tony [name] (May 2016).  Tony [name] (July 2013).    Wife works as Emergency planning/management officer since 2012      Hobbies: time with sons, going to gym- [employer] 7 days a week   [name]   No Known Allergies Family History  Problem Relation Age of Onset  . Healthy Mother   . Sinusitis Father        recurrent- sinus surgery multiple. rare fungal infection 20 cases in world.   Marland Kitchen CAD Father        age 51 first heart attack. never smoker  . Alcohol abuse Father   . Depression Father        abused pain pills in past  . Atrial fibrillation Father   . Kidney disease Father        lifestyle/treatment of sinus issues related  . Healthy Brother      Past medical history, social, surgical and family history all reviewed in electronic medical record.  No pertanent information unless stated regarding to the chief complaint.   Review of Systems:Review of systems updated and as accurate as of 06/25/17  No headache, visual changes, nausea, vomiting, diarrhea, constipation, dizziness, abdominal pain, skin rash,  fevers, chills, night sweats, weight loss, swollen lymph nodes, body aches, joint swelling,  chest pain, shortness of breath, mood changes.  Positive muscle aches  Objective  Blood pressure 118/82, pulse 82, height 6\' 1"  (1.854 m), weight 203 lb (92.1 kg), SpO2 98 %. Systems examined below as of 06/25/17   General: No apparent distress alert and oriented x3 mood and affect normal, dressed appropriately.  HEENT: Pupils equal, extraocular movements intact  Respiratory: Patient's speak in full sentences and does not appear short of breath  Cardiovascular: No lower extremity edema, non tender, no erythema  Skin: Warm dry intact with no signs of infection or rash on extremities or on axial skeleton.  Abdomen: Soft nontender  Neuro: Cranial nerves II through XII are intact, neurovascularly intact in all extremities with 2+ DTRs and 2+ pulses.  Lymph: No lymphadenopathy of posterior or anterior cervical chain or axillae  bilaterally.  Gait normal with good balance and coordination.  MSK:  Non tender with full range of motion and good stability and symmetric strength and tone of shoulders, elbows, wrist, hip, knee and ankles bilaterally.  Back Exam:  Inspection: Loss of lordosis Motion: Flexion 45 deg, Extension 25 deg, Side Bending to 35 deg bilaterally,  Rotation to 40 deg bilaterally  SLR laying: Negative  XSLR laying: Negative  Palpable tenderness: Tender to palpation of the paraspinal musculature lumbar spine right greater than left. FABER: negative. Sensory change: Gross sensation intact to all lumbar and sacral dermatomes.  Reflexes: 2+ at both patellar tendons, 2+ at achilles tendons, Babinski's downgoing.  Strength at foot  Plantar-flexion: 5/5 Dorsi-flexion: 5/5 Eversion: 5/5 Inversion: 5/5  Leg strength  Quad: 5/5 Hamstring: 5/5 Hip flexor: 5/5 Hip abductors: 5/5  Gait unremarkable.  Osteopathic findings C6 flexed rotated and side bent left T7 extended rotated and side bent left L2 flexed rotated and side bent right Sacrum right on right    Impression and Recommendations:     This case required medical decision making of moderate complexity.      Note: This dictation was prepared with Dragon dictation along with smaller phrase technology. Any transcriptional errors that result from this process are unintentional.

## 2017-06-25 NOTE — Patient Instructions (Signed)
Good to see you See me again in 6-8 weeks 

## 2017-06-30 DIAGNOSIS — J3089 Other allergic rhinitis: Secondary | ICD-10-CM | POA: Diagnosis not present

## 2017-06-30 DIAGNOSIS — J301 Allergic rhinitis due to pollen: Secondary | ICD-10-CM | POA: Diagnosis not present

## 2017-06-30 DIAGNOSIS — J3081 Allergic rhinitis due to animal (cat) (dog) hair and dander: Secondary | ICD-10-CM | POA: Diagnosis not present

## 2017-07-06 DIAGNOSIS — J3081 Allergic rhinitis due to animal (cat) (dog) hair and dander: Secondary | ICD-10-CM | POA: Diagnosis not present

## 2017-07-06 DIAGNOSIS — J3089 Other allergic rhinitis: Secondary | ICD-10-CM | POA: Diagnosis not present

## 2017-07-06 DIAGNOSIS — J301 Allergic rhinitis due to pollen: Secondary | ICD-10-CM | POA: Diagnosis not present

## 2017-07-13 DIAGNOSIS — J3089 Other allergic rhinitis: Secondary | ICD-10-CM | POA: Diagnosis not present

## 2017-07-13 DIAGNOSIS — J301 Allergic rhinitis due to pollen: Secondary | ICD-10-CM | POA: Diagnosis not present

## 2017-07-13 DIAGNOSIS — J3081 Allergic rhinitis due to animal (cat) (dog) hair and dander: Secondary | ICD-10-CM | POA: Diagnosis not present

## 2017-07-17 DIAGNOSIS — J3089 Other allergic rhinitis: Secondary | ICD-10-CM | POA: Diagnosis not present

## 2017-07-17 DIAGNOSIS — J3081 Allergic rhinitis due to animal (cat) (dog) hair and dander: Secondary | ICD-10-CM | POA: Diagnosis not present

## 2017-07-17 DIAGNOSIS — J301 Allergic rhinitis due to pollen: Secondary | ICD-10-CM | POA: Diagnosis not present

## 2017-07-22 DIAGNOSIS — J301 Allergic rhinitis due to pollen: Secondary | ICD-10-CM | POA: Diagnosis not present

## 2017-07-27 DIAGNOSIS — J3089 Other allergic rhinitis: Secondary | ICD-10-CM | POA: Diagnosis not present

## 2017-07-27 DIAGNOSIS — J3081 Allergic rhinitis due to animal (cat) (dog) hair and dander: Secondary | ICD-10-CM | POA: Diagnosis not present

## 2017-07-27 DIAGNOSIS — J301 Allergic rhinitis due to pollen: Secondary | ICD-10-CM | POA: Diagnosis not present

## 2017-08-10 NOTE — Progress Notes (Signed)
[name] Torrell Krutz D.O. [facility] [address] [place], [place] [address] Phone: [phone] Subjective:    I'm seeing this patient by the request  of:    CC: Back and hip pain  BJY:NWGNFAOZHYHPI:Subjective  Tony [name] is a 37 y.o. male coming in with complaint of right hip pain. Sitting for prolonged periods and using a tread climber increased his pain. Has had pain for 1.5 months. Back pain has subsided.  Patient states that this is more irritating than truly a pain.  Patient rates the severity of pain is 3 out of 10.  Seems though that has made him stop exercising a little prematurely.  Does not stop on with daily activities.      Past Medical History:  Diagnosis Date  . ALLERGIC RHINITIS 09/17/2009  . ASTHMA 09/17/2009  . HYPERLIPIDEMIA 09/17/2009   Past Surgical History:  Procedure Laterality Date  . NASAL SINUS SURGERY Right 12/04/2014   Dr. Jenne PaneBates  . SINUS EXPLORATION  Nov 2014    per Dr. Jenne PaneBates    Social History   Socioeconomic History  . Marital status: Married    Spouse name: Not on file  . Number of children: Not on file  . Years of education: Not on file  . Highest education level: Not on file  Occupational History  . Not on file  Social Needs  . Financial resource strain: Not on file  . Food insecurity:    Worry: Not on file    Inability: Not on file  . Transportation needs:    Medical: Not on file    Non-medical: Not on file  Tobacco Use  . Smoking status: Never Smoker  . Smokeless tobacco: Never Used  Substance and Sexual Activity  . Alcohol use: Yes    Comment: occ  . Drug use: No  . Sexual activity: Not on file  Lifestyle  . Physical activity:    Days per week: Not on file    Minutes per session: Not on file  . Stress: Not on file  Relationships  . Social connections:    Talks on phone: Not on file    Gets together: Not on file    Attends religious service: Not on file    Active member of club or organization: Not on file    Attends  meetings of clubs or organizations: Not on file    Relationship status: Not on file  Other Topics Concern  . Not on file  Social History Narrative   Married. 2 children. [name] (May 2016). [name] (July 2013).    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 Healthy Mother   . Sinusitis Father        recurrent- sinus surgery multiple. rare fungal infection 20 cases in world.   Marland Kitchen. CAD Father        age 35 first heart attack. never smoker  . Alcohol abuse Father   . Depression Father        abused pain pills in past  . Atrial fibrillation Father   . Kidney disease Father        lifestyle/treatment of sinus issues related  . Healthy Brother      Past medical history, social, surgical and family history all  reviewed in electronic medical record.  No pertanent information unless stated regarding to the chief complaint.   Review of Systems:Review of systems updated and as accurate as of 08/11/17  No headache, visual changes, nausea, vomiting, diarrhea, constipation, dizziness, abdominal pain, skin rash, fevers, chills, night sweats, weight loss, swollen lymph nodes, body aches, joint swelling,  chest pain, shortness of breath, mood changes.  Positive muscle aches  Objective  Blood pressure 110/80, pulse 76, height 6\' 1"  (1.854 m), weight 206 lb (93.4 kg), SpO2 98 %. Systems examined below as of 08/11/17   General: No apparent distress alert and oriented x3 mood and affect normal, dressed appropriately.  HEENT: Pupils equal, extraocular movements intact  Respiratory: Patient's speak in full sentences and does not appear short of breath  Cardiovascular: No lower extremity edema, non tender, no erythema  Skin: Warm dry intact with no signs of infection or rash on extremities or on axial skeleton.  Abdomen: Soft nontender  Neuro:  Cranial nerves II through XII are intact, neurovascularly intact in all extremities with 2+ DTRs and 2+ pulses.  Lymph: No lymphadenopathy of posterior or anterior cervical chain or axillae bilaterally.  Gait normal with good balance and coordination.  MSK:  Non tender with full range of motion and good stability and symmetric strength and tone of shoulders, elbows, wrist, hip, knee and ankles bilaterally.  Back Exam:  Inspection: Unremarkable  Motion: Flexion 45 deg, Extension 20 deg, significant tightness hip flexors side Bending to 35 deg bilaterally,  Rotation to 35 deg bilaterally  SLR laying: Negative mild increase in hip pain XSLR laying: Negative  Palpable tenderness: Tender to palpation in the paraspinal musculature lumbar spine. FABER: negative. Sensory change: Gross sensation intact to all lumbar and sacral dermatomes.  Reflexes: 2+ at both patellar tendons, 2+ at achilles tendons, Babinski's downgoing.  Strength at foot  Plantar-flexion: 5/5 Dorsi-flexion: 5/5 Eversion: 5/5 Inversion: 5/5  Leg strength  Quad: 5/5 Hamstring: 5/5 Hip flexor: 5/5 Hip abductors: 5/5  Gait unremarkable.  Osteopathic findings C2 flexed rotated and side bent right C4 flexed rotated and side bent left T3 extended rotated and side bent right inhaled third rib T6 extended rotated and side bent left L2 flexed rotated and side bent right Sacrum right on right     Impression and Recommendations:     This case required medical decision making of moderate complexity.      Note: This dictation was prepared with Dragon dictation along with smaller phrase technology. Any transcriptional errors that result from this process are unintentional.

## 2017-08-11 ENCOUNTER — Encounter: Payer: Self-pay | Admitting: Family Medicine

## 2017-08-11 ENCOUNTER — Ambulatory Visit (INDEPENDENT_AMBULATORY_CARE_PROVIDER_SITE_OTHER)
Admission: RE | Admit: 2017-08-11 | Discharge: 2017-08-11 | Disposition: A | Discharge status: Home / Self Care | Payer: 59 | Source: Ambulatory Visit | Attending: Family Medicine | Admitting: Family Medicine

## 2017-08-11 ENCOUNTER — Ambulatory Visit: Payer: 59 | Admitting: Family Medicine

## 2017-08-11 VITALS — BP 110/80 | HR 76 | Ht 73.0 in | Wt 206.0 lb

## 2017-08-11 DIAGNOSIS — M545 Low back pain: Secondary | ICD-10-CM

## 2017-08-11 DIAGNOSIS — M5136 Other intervertebral disc degeneration, lumbar region: Secondary | ICD-10-CM | POA: Diagnosis not present

## 2017-08-11 DIAGNOSIS — M999 Biomechanical lesion, unspecified: Secondary | ICD-10-CM

## 2017-08-11 DIAGNOSIS — M533 Sacrococcygeal disorders, not elsewhere classified: Secondary | ICD-10-CM | POA: Diagnosis not present

## 2017-08-11 DIAGNOSIS — M25551 Pain in right hip: Secondary | ICD-10-CM

## 2017-08-11 NOTE — Assessment & Plan Note (Signed)
Stable overall.  I do believe the patient is having some increasing hip flexor tightness or tendinitis more distally.  Patient could have possibly intra-articular hip pathology and x-rays ordered today.  Do not see any signs of true radicular symptoms.  Patient has done very well with the back.  Attempted osteopathic manipulation again today.  Discussed which activities to do which wants to avoid.  Follow-up again in 4 weeks

## 2017-08-11 NOTE — Assessment & Plan Note (Signed)
Decision today to treat with OMT was based on Physical Exam  After verbal consent patient was treated with HVLA, ME, FPR techniques in cervical, thoracic, lumbar and sacral areas  Patient tolerated the procedure well with improvement in symptoms  Patient given exercises, stretches and lifestyle modifications  See medications in patient instructions if given  Patient will follow up in 4 weeks 

## 2017-08-11 NOTE — Patient Instructions (Addendum)
Good to see you  Tony [name] is your friend.  Stay active.  I like the bodyguard New exercises for the hip flexor  Ibuprofen 600mg  3 times a day for 3 days  See me again in 4-6 weeks

## 2017-08-12 ENCOUNTER — Encounter: Payer: Self-pay | Admitting: Family Medicine

## 2017-08-12 ENCOUNTER — Ambulatory Visit (INDEPENDENT_AMBULATORY_CARE_PROVIDER_SITE_OTHER): Payer: 59 | Admitting: Family Medicine

## 2017-08-12 VITALS — BP 122/74 | HR 65 | Temp 97.5°F | Ht 73.0 in | Wt 206.0 lb

## 2017-08-12 DIAGNOSIS — Z0000 Encounter for general adult medical examination without abnormal findings: Secondary | ICD-10-CM

## 2017-08-12 DIAGNOSIS — E785 Hyperlipidemia, unspecified: Secondary | ICD-10-CM | POA: Diagnosis not present

## 2017-08-12 LAB — COMPREHENSIVE METABOLIC PANEL
ALT: 28 U/L (ref 0–53)
AST: 21 U/L (ref 0–37)
Albumin: 4.3 g/dL (ref 3.5–5.2)
Alkaline Phosphatase: 66 U/L (ref 39–117)
BUN: 12 mg/dL (ref 6–23)
CHLORIDE: 103 meq/L (ref 96–112)
CO2: 30 mEq/L (ref 19–32)
CREATININE: 1.05 mg/dL (ref 0.40–1.50)
Calcium: 9.6 mg/dL (ref 8.4–10.5)
GFR: 85.39 mL/min (ref >60.00)
Glucose, Bld: 94 mg/dL (ref 70–99)
Potassium: 4.6 mEq/L (ref 3.5–5.1)
SODIUM: 138 meq/L (ref 135–145)
Total Bilirubin: 0.5 mg/dL (ref 0.2–1.2)
Total Protein: 6.9 g/dL (ref 6.0–8.3)

## 2017-08-12 LAB — CBC WITH DIFFERENTIAL/PLATELET
BASOS PCT: 0.9 % (ref 0.0–3.0)
Basophils Absolute: 0 10*3/uL (ref 0.0–0.1)
EOS ABS: 0.3 10*3/uL (ref 0.0–0.7)
Eosinophils Relative: 5.4 % — ABNORMAL HIGH (ref 0.0–5.0)
HCT: 47.6 % (ref 39.0–52.0)
Hemoglobin: 15.9 g/dL (ref 13.0–17.0)
Lymphocytes Relative: 37.1 % (ref 12.0–46.0)
Lymphs Abs: 2 10*3/uL (ref 0.7–4.0)
MCHC: 33.5 g/dL (ref 30.0–36.0)
MCV: 91.4 fl (ref 78.0–100.0)
Monocytes Absolute: 0.5 10*3/uL (ref 0.1–1.0)
Monocytes Relative: 8.6 % (ref 3.0–12.0)
NEUTROS ABS: 2.6 10*3/uL (ref 1.4–7.7)
Neutrophils Relative %: 48 % (ref 43.0–77.0)
PLATELETS: 158 10*3/uL (ref 150.0–400.0)
RBC: 5.21 Mil/uL (ref 4.22–5.81)
RDW: 13.4 % (ref 11.5–15.5)
WBC: 5.5 10*3/uL (ref 4.0–10.5)

## 2017-08-12 LAB — LIPID PANEL
CHOLESTEROL: 219 mg/dL — AB (ref 0–200)
HDL: 46.6 mg/dL (ref >39.00)
NONHDL: 172.52
TRIGLYCERIDES: 208 mg/dL — AB (ref 0.0–149.0)
Total CHOL/HDL Ratio: 5
VLDL: 41.6 mg/dL — ABNORMAL HIGH (ref 0.0–40.0)

## 2017-08-12 LAB — LDL CHOLESTEROL, DIRECT: Direct LDL: 153 mg/dL

## 2017-08-12 NOTE — Progress Notes (Signed)
Phone: [phone]  Subjective:  Patient presents today for their annual physical. Chief complaint-noted.   See problem oriented charting- ROS- full  review of systems was completed and negative except for: back pain, seasonal allergies  The following were reviewed and entered/updated in epic: Past Medical History:  Diagnosis Date  . ALLERGIC RHINITIS 09/17/2009  . ASTHMA 09/17/2009  . HYPERLIPIDEMIA 09/17/2009   Patient Active Problem List   Diagnosis Date Noted  . Dyslipidemia 09/17/2009    Priority: Medium  . Allergic rhinitis 09/17/2009    Priority: Medium  . Asthma 09/17/2009    Priority: Medium  . IBS-D 10/11/2015    Priority: Low  . Quadriceps tendinitis 11/06/2015  . SI (sacroiliac) joint dysfunction 11/06/2015  . Nonallopathic lesion of sacral region 11/06/2015  . Nonallopathic lesion of lumbosacral region 11/06/2015  . Nonallopathic lesion of thoracic region 11/06/2015   Past Surgical History:  Procedure Laterality Date  . NASAL SINUS SURGERY Right 12/04/2014   Dr. Jenne Pane  . SINUS EXPLORATION  Nov 2014    per Dr. Jenne Pane     Family History  Problem Relation Age of Onset  . Healthy Mother   . Sinusitis Father        recurrent- sinus surgery multiple. rare fungal infection 20 cases in [place].   Marland Kitchen CAD Father        age 24 first heart attack. never smoker  . Alcohol abuse Father   . Depression Father        abused pain pills in past  . Atrial fibrillation Father   . Kidney disease Father        lifestyle/treatment of sinus issues related  . Healthy Brother     Medications- reviewed and updated Current Outpatient Medications  Medication Sig Dispense Refill  . albuterol (PROVENTIL HFA;VENTOLIN HFA) 108 (90 Base) MCG/ACT inhaler Take 2 puffs by mouth every 6 hours as needed 18 Inhaler 0  . ARNUITY ELLIPTA 100 MCG/ACT AEPB     . EPINEPHrine 0.3 mg/0.3 mL IJ SOAJ injection as directed.    . fexofenadine (ALLEGRA) 180 MG tablet Take 180 mg by mouth daily.      . fluticasone (FLONASE) 50 MCG/ACT nasal spray 2 sprays by Nasal route daily.      . montelukast (SINGULAIR) 10 MG tablet TAKE 1 TABLET IN THE EVENING ONCE A DAY  5  . Olopatadine HCl (PATANASE) 0.6 % SOLN by Nasal route. 2 sprays to each nostril daily     . methylPREDNISolone (MEDROL DOSEPAK) 4 MG TBPK tablet 6-5-4-3-2-1 (Patient not taking: Reported on 08/12/2017) 21 tablet 0   Allergies-reviewed and updated No Known Allergies  Social History   Social History Narrative   Married. 2 children. [name] (May 2016). [name] (July 2013).    Wife works as Emergency planning/management officer since 2012      Hobbies: time with sons, going to gym- goal 7 days a week   [name]    Objective: BP 122/74 (BP Location: Left Arm, Patient Position: Sitting, Cuff Size: Large)   Pulse 65   Temp (!) 97.5 F (36.4 C) (Oral)   Ht 6\' 1"  (1.854 m)   Wt 206 lb (93.4 kg)   SpO2 97%   BMI 27.18 kg/m  Gen: NAD, resting comfortably HEENT: Mucous membranes are moist. Oropharynx normal Neck: no thyromegaly CV: RRR no murmurs rubs or gallops Lungs: CTAB no crackles, wheeze, rhonchi Abdomen: soft/nontender/nondistended/normal bowel sounds. No rebound or guarding.  Ext: no edema  Skin: warm, dry Neuro: grossly normal, moves all extremities, PERRLA  Assessment/Plan:  37 y.o. male presenting for annual physical.  Health Maintenance counseling: 1. Anticipatory guidance: Patient counseled regarding regular dental exams -q6 months, eye exams -yearly-  has astigmatism, wearing seatbelts.  2. Risk factor reduction:  Advised patient of need for regular exercise and diet rich and fruits and vegetables to reduce risk of heart attack and stroke. Weight up 7 lbs from last year Exercise- two times a week- down from 4-5 x a week last year. Diet-has slipped up- he plans to make better choices. A lot of issues like coming home from work and getting ice cream Wt Readings from Last 3 Encounters:  08/12/17 206 lb (93.4 kg)   08/11/17 206 lb (93.4 kg)  06/25/17 203 lb (92.1 kg)  3. Immunizations/screenings/ancillary studies- flu shot in the fall advised  Immunization History  Administered Date(s) Administered  . Influenza Split 09/18/2011, 11/05/2015  . Influenza,inj,Quad PF,6+ Mos 11/02/2012, 01/25/2014, 10/20/2014  . Pneumococcal Polysaccharide-23 11/02/2012  . Tdap 09/18/2011  4. Prostate cancer screening-  no family history, start at age 35-55 5. Colon cancer screening -  no family history, start at age 35-50 6. Skin cancer screening/prevention- Dr. Terri PiedraLupton for dermatology - hasnt seen in a few years. Marland Kitchen. advised regular sunscreen use. Denies worrisome, changing, or new skin lesions.  7. Testicular cancer screening- advised monthly self exams  8. STD screening- patient opts out - monogamous  Status of chronic or acute concerns   Asthma- with Dr. Irena CordsVan Winkle. . Stable on arnuity and singulair. Obviously singulair and allegra and flonase and immunotherapy for allergies. Also patanase. hasnt had to use albuterol in months  dyslipidemia mild- weight loss encouraged.  IBS- D better as long as avoids fatty foods. Last July we advised trial of reduction of fodmap foods. He thinks [place] food and queso cheese were triggers- has cut back and doing better  Follows with Dr. Katrinka BlazingSmith of sports medicine- continued issues with right hip over last month. Prior to that was working the back/SI joint with PT, dry needling. Encouraged him to do exercises that Dr. Katrinka BlazingSmith advised- I wonder about hip flexor strain/tightness- he started biking within a month of this starting - advised relative rest  Patient gets skin irritation/ingrown hairs/pimples when shaves frequently. We will write a letter to his work to allow him to decrease frequency of shaves  Future Appointments  Date Time Provider Department Center  09/15/2017  9:00 AM Judi SaaSmith, Zachary M, DO [facility]   Lab/Order associations: Annual physical exam - Plan:  Comprehensive metabolic panel, CBC with Differential/Platelet, Lipid panel  Dyslipidemia - Plan: Comprehensive metabolic panel, CBC with Differential/Platelet, Lipid panel  Return precautions advised.   Tana ConchStephen Tabita Corbo, MD

## 2017-08-12 NOTE — Patient Instructions (Addendum)
Health Maintenance Due  Topic Date Due  . INFLUENZA VACCINE - in the fall 08/06/2017   Please stop by the lab before you leave.  I would love for you to focus on about 5-10 lbs down over the next year. You may blow past that and that's great as well. I like your long term 180 goal

## 2017-09-11 DIAGNOSIS — J301 Allergic rhinitis due to pollen: Secondary | ICD-10-CM | POA: Diagnosis not present

## 2017-09-11 DIAGNOSIS — J3089 Other allergic rhinitis: Secondary | ICD-10-CM | POA: Diagnosis not present

## 2017-09-11 DIAGNOSIS — J3081 Allergic rhinitis due to animal (cat) (dog) hair and dander: Secondary | ICD-10-CM | POA: Diagnosis not present

## 2017-09-13 NOTE — Progress Notes (Signed)
[name] [address] [place], [place] [address] Phone: [phone] Subjective:    [name] am serving as a Neurosurgeon for Dr. Antoine Primas.    CC: Back and right hip pain follow-up  [name]  Tony [name] is a 37 y.o. male coming in with complaint of right hip pain. States his hip is painful. Has been riding his bike. Pain is not constant but it is no better than the last visit. Has been stretching.  Nothing debilitating just gives discomfort and pain on mostly a daily basis.  Does not stop him from any activities at all.  Sometimes sitting seems to make it worse.     Past Medical History:  Diagnosis Date  . ALLERGIC RHINITIS 09/17/2009  . ASTHMA 09/17/2009  . HYPERLIPIDEMIA 09/17/2009   Past Surgical History:  Procedure Laterality Date  . NASAL SINUS SURGERY Right 12/04/2014   Dr. Jenne Pane  . SINUS EXPLORATION  Nov 2014    per Dr. Jenne Pane    Social History   Socioeconomic History  . Marital status: Married    Spouse name: Not on file  . Number of children: Not on file  . Years of education: Not on file  . Highest education level: Not on file  Occupational History  . Not on file  Social Needs  . Financial resource strain: Not on file  . Food insecurity:    Worry: Not on file    Inability: Not on file  . Transportation needs:    Medical: Not on file    Non-medical: Not on file  Tobacco Use  . Smoking status: Never Smoker  . Smokeless tobacco: Never Used  Substance and Sexual Activity  . Alcohol use: Yes    Comment: occ  . Drug use: No  . Sexual activity: Not on file  Lifestyle  . Physical activity:    Days per week: Not on file    Minutes per session: Not on file  . Stress: Not on file  Relationships  . Social connections:    Talks on phone: Not on file    Gets together: Not on file    Attends religious service: Not on file    Active member of club or organization: Not on file    Attends meetings of clubs or  organizations: Not on file    Relationship status: Not on file  Other Topics Concern  . Not on file  Social History Narrative   Married. 2 children. [name] (May 2016). [name] (July 2013).    Wife works as Emergency planning/management officer since 2012      Hobbies: time with sons, going to gym- goal 7 days a week   [name]   No Known Allergies Family History  Problem Relation Age of Onset  . Healthy Mother   . Sinusitis Father        recurrent- sinus surgery multiple. rare fungal infection 20 cases in [place].   Marland Kitchen CAD Father        age 41 first heart attack. never smoker  . Alcohol abuse Father   . Depression Father        abused pain pills in past  . Atrial fibrillation Father   . Kidney disease Father        lifestyle/treatment of sinus issues related  . Healthy Brother     Current Outpatient Medications (Endocrine & Metabolic):  .  methylPREDNISolone (MEDROL DOSEPAK) 4 MG TBPK  tablet, 6-5-4-3-2-1  Current Outpatient Medications (Cardiovascular):  Marland Kitchen  EPINEPHrine 0.3 mg/0.3 mL IJ SOAJ injection, as directed.  Current Outpatient Medications (Respiratory):  .  albuterol (PROVENTIL HFA;VENTOLIN HFA) 108 (90 Base) MCG/ACT inhaler, Take 2 puffs by mouth every 6 hours as needed .  ARNUITY ELLIPTA 100 MCG/ACT AEPB,  .  fexofenadine (ALLEGRA) 180 MG tablet, Take 180 mg by mouth daily. .  fluticasone (FLONASE) 50 MCG/ACT nasal spray, 2 sprays by Nasal route daily.   .  montelukast (SINGULAIR) 10 MG tablet, TAKE 1 TABLET IN THE EVENING ONCE A DAY .  Olopatadine HCl (PATANASE) 0.6 % SOLN, by Nasal route. 2 sprays to each nostril daily        Past medical history, social, surgical and family history all reviewed in electronic medical record.  No pertanent information unless stated regarding to the chief complaint.   Review of Systems:  No headache, visual changes, nausea, vomiting, diarrhea, constipation, dizziness, abdominal pain, skin rash, fevers, chills, night sweats, weight  loss, swollen lymph nodes, body aches, joint swelling,  chest pain, shortness of breath, mood changes.  Positive muscle aches  Objective  Blood pressure 110/80, pulse 61, height 6\' 1"  (1.854 m), weight 207 lb (93.9 kg), SpO2 98 %.   General: No apparent distress alert and oriented x3 mood and affect normal, dressed appropriately.  HEENT: Pupils equal, extraocular movements intact  Respiratory: Patient's speak in full sentences and does not appear short of breath  Cardiovascular: No lower extremity edema, non tender, no erythema  Skin: Warm dry intact with no signs of infection or rash on extremities or on axial skeleton.  Abdomen: Soft nontender  Neuro: Cranial nerves II through XII are intact, neurovascularly intact in all extremities with 2+ DTRs and 2+ pulses.  Lymph: No lymphadenopathy of posterior or anterior cervical chain or axillae bilaterally.  Gait normal with good balance and coordination.  MSK:  Non tender with full range of motion and good stability and symmetric strength and tone of shoulders, elbows, wrist, hip, knee and ankles bilaterally.  Back Exam:  Inspection: Unremarkable  Motion: Flexion 45 deg, Extension 25 deg, Side Bending to 35 deg bilaterally,  Rotation to 35 deg bilaterally  SLR laying: Negative  XSLR laying: Negative  Palpable tenderness: Tender to palpation the paraspinal musculature lumbar spine right greater than left. FABER: Positive right. Sensory change: Gross sensation intact to all lumbar and sacral dermatomes.  Reflexes: 2+ at both patellar tendons, 2+ at achilles tendons, Babinski's downgoing.  Strength at foot  Plantar-flexion: 5/5 Dorsi-flexion: 5/5 Eversion: 5/5 Inversion: 5/5  Leg strength  Quad: 5/5 Hamstring: 5/5 Hip flexor: 5/5 Hip abductors: 4/5 but symmetric Gait unremarkable.  Osteopathic findings  T9 extended rotated and side bent left L3 flexed rotated and side bent right Sacrum right on right    Impression and Recommendations:       This case required medical decision making of moderate complexity. The above documentation has been reviewed and is accurate and complete Judi Saa, DO       Note: This dictation was prepared with Dragon dictation along with smaller phrase technology. Any transcriptional errors that result from this process are unintentional.

## 2017-09-15 ENCOUNTER — Ambulatory Visit: Payer: 59 | Admitting: Family Medicine

## 2017-09-15 ENCOUNTER — Encounter: Payer: Self-pay | Admitting: Family Medicine

## 2017-09-15 DIAGNOSIS — M533 Sacrococcygeal disorders, not elsewhere classified: Secondary | ICD-10-CM | POA: Diagnosis not present

## 2017-09-15 DIAGNOSIS — M999 Biomechanical lesion, unspecified: Secondary | ICD-10-CM | POA: Diagnosis not present

## 2017-09-15 NOTE — Patient Instructions (Signed)
Lets keep watching it Push it and lets see how it goes.  See me againin 4 weeks and we will decide on ultrasound or MRI if needed

## 2017-09-15 NOTE — Assessment & Plan Note (Signed)
Decision today to treat with OMT was based on Physical Exam  After verbal consent patient was treated with HVLA, ME, FPR techniques in  thoracic, lumbar and sacral areas  Patient tolerated the procedure well with improvement in symptoms  Patient given exercises, stretches and lifestyle modifications  See medications in patient instructions if given  Patient will follow up in 4-6 weeks 

## 2017-09-15 NOTE — Assessment & Plan Note (Signed)
Discussed posture and ergonomics, which activities of doing which wants to avoid.  Discussed avoiding certain activities such as repetitive picking up with heavy materials.  We discussed advanced imaging may be necessary with patient also having some right hip pain.  Discussed icing regimen and home exercises.  Follow-up again in 4 to 6 weeks

## 2017-09-17 ENCOUNTER — Encounter: Payer: Self-pay | Admitting: Family Medicine

## 2017-09-17 DIAGNOSIS — K4090 Unilateral inguinal hernia, without obstruction or gangrene, not specified as recurrent: Secondary | ICD-10-CM

## 2017-09-18 DIAGNOSIS — J3081 Allergic rhinitis due to animal (cat) (dog) hair and dander: Secondary | ICD-10-CM | POA: Diagnosis not present

## 2017-09-18 DIAGNOSIS — J3089 Other allergic rhinitis: Secondary | ICD-10-CM | POA: Diagnosis not present

## 2017-09-18 DIAGNOSIS — J301 Allergic rhinitis due to pollen: Secondary | ICD-10-CM | POA: Diagnosis not present

## 2017-09-21 DIAGNOSIS — J3089 Other allergic rhinitis: Secondary | ICD-10-CM | POA: Diagnosis not present

## 2017-09-21 DIAGNOSIS — J3081 Allergic rhinitis due to animal (cat) (dog) hair and dander: Secondary | ICD-10-CM | POA: Diagnosis not present

## 2017-09-21 DIAGNOSIS — J301 Allergic rhinitis due to pollen: Secondary | ICD-10-CM | POA: Diagnosis not present

## 2017-09-22 ENCOUNTER — Other Ambulatory Visit: Payer: Self-pay

## 2017-09-22 DIAGNOSIS — R109 Unspecified abdominal pain: Secondary | ICD-10-CM

## 2017-09-23 ENCOUNTER — Inpatient Hospital Stay: Admission: RE | Admit: 2017-09-23 | Payer: Self-pay | Source: Ambulatory Visit

## 2017-09-23 ENCOUNTER — Ambulatory Visit
Admission: RE | Admit: 2017-09-23 | Discharge: 2017-09-23 | Disposition: A | Discharge status: Home / Self Care | Payer: 59 | Source: Ambulatory Visit | Attending: Family Medicine | Admitting: Family Medicine

## 2017-09-23 DIAGNOSIS — R1030 Lower abdominal pain, unspecified: Secondary | ICD-10-CM | POA: Diagnosis not present

## 2017-09-23 DIAGNOSIS — B999 Unspecified infectious disease: Secondary | ICD-10-CM | POA: Diagnosis not present

## 2017-09-23 DIAGNOSIS — J4540 Moderate persistent asthma, uncomplicated: Secondary | ICD-10-CM | POA: Diagnosis not present

## 2017-09-23 DIAGNOSIS — J3089 Other allergic rhinitis: Secondary | ICD-10-CM | POA: Diagnosis not present

## 2017-09-23 DIAGNOSIS — J3081 Allergic rhinitis due to animal (cat) (dog) hair and dander: Secondary | ICD-10-CM | POA: Diagnosis not present

## 2017-09-23 DIAGNOSIS — R109 Unspecified abdominal pain: Secondary | ICD-10-CM

## 2017-09-23 DIAGNOSIS — J301 Allergic rhinitis due to pollen: Secondary | ICD-10-CM | POA: Diagnosis not present

## 2017-09-30 DIAGNOSIS — J3089 Other allergic rhinitis: Secondary | ICD-10-CM | POA: Diagnosis not present

## 2017-09-30 DIAGNOSIS — J3081 Allergic rhinitis due to animal (cat) (dog) hair and dander: Secondary | ICD-10-CM | POA: Diagnosis not present

## 2017-09-30 DIAGNOSIS — J301 Allergic rhinitis due to pollen: Secondary | ICD-10-CM | POA: Diagnosis not present

## 2017-10-02 DIAGNOSIS — J3081 Allergic rhinitis due to animal (cat) (dog) hair and dander: Secondary | ICD-10-CM | POA: Diagnosis not present

## 2017-10-02 DIAGNOSIS — J301 Allergic rhinitis due to pollen: Secondary | ICD-10-CM | POA: Diagnosis not present

## 2017-10-02 DIAGNOSIS — J3089 Other allergic rhinitis: Secondary | ICD-10-CM | POA: Diagnosis not present

## 2017-10-06 DIAGNOSIS — J3081 Allergic rhinitis due to animal (cat) (dog) hair and dander: Secondary | ICD-10-CM | POA: Diagnosis not present

## 2017-10-06 DIAGNOSIS — J3089 Other allergic rhinitis: Secondary | ICD-10-CM | POA: Diagnosis not present

## 2017-10-06 DIAGNOSIS — J301 Allergic rhinitis due to pollen: Secondary | ICD-10-CM | POA: Diagnosis not present

## 2017-10-08 DIAGNOSIS — Z23 Encounter for immunization: Secondary | ICD-10-CM | POA: Diagnosis not present

## 2017-10-12 NOTE — Progress Notes (Signed)
[name] [address] [place], [place] [address] Phone: [phone] Subjective:   Tony [name], am serving as a scribe for Dr. Antoine Primas.      CC: Right hip pain follow-up  [name]  Tony [name] is a 37 y.o. male coming in with complaint of back and hip pain. His pain is not getting better. Sitting makes his pain worse. Pain is intermittent with varying intensities. Denies any radiating symptoms.  Patient was sent for an ultrasound of the inguinal area that ruled out any type of hernia.  Patient continues to have pain.  Does not seem to make any difference with or without activity at this time.  Staying much more localized to the groin area.     Past Medical History:  Diagnosis Date  . ALLERGIC RHINITIS 09/17/2009  . ASTHMA 09/17/2009  . HYPERLIPIDEMIA 09/17/2009   Past Surgical History:  Procedure Laterality Date  . NASAL SINUS SURGERY Right 12/04/2014   Dr. Jenne Pane  . SINUS EXPLORATION  Nov 2014    per Dr. Jenne Pane    Social History   Socioeconomic History  . Marital status: Married    Spouse name: Not on file  . Number of children: Not on file  . Years of education: Not on file  . Highest education level: Not on file  Occupational History  . Not on file  Social Needs  . Financial resource strain: Not on file  . Food insecurity:    Worry: Not on file    Inability: Not on file  . Transportation needs:    Medical: Not on file    Non-medical: Not on file  Tobacco Use  . Smoking status: Never Smoker  . Smokeless tobacco: Never Used  Substance and Sexual Activity  . Alcohol use: Yes    Comment: occ  . Drug use: No  . Sexual activity: Not on file  Lifestyle  . Physical activity:    Days per week: Not on file    Minutes per session: Not on file  . Stress: Not on file  Relationships  . Social connections:    Talks on phone: Not on file    Gets together: Not on file    Attends religious service: Not on file    Active  member of club or organization: Not on file    Attends meetings of clubs or organizations: Not on file    Relationship status: Not on file  Other Topics Concern  . Not on file  Social History Narrative   Married. 2 children. [name] (May 2016). [name] (July 2013).    Wife works as Emergency planning/management officer since 2012      Hobbies: time with sons, going to gym- goal 7 days a week   [name]   No Known Allergies Family History  Problem Relation Age of Onset  . Healthy Mother   . Sinusitis Father        recurrent- sinus surgery multiple. rare fungal infection 20 cases in world.   Marland Kitchen CAD Father        age 72 first heart attack. never smoker  . Alcohol abuse Father   . Depression Father        abused pain pills in past  . Atrial fibrillation Father   . Kidney disease Father        lifestyle/treatment of sinus issues related  . Healthy Brother     Current Outpatient Medications (  Endocrine & Metabolic):  .  methylPREDNISolone (MEDROL DOSEPAK) 4 MG TBPK tablet, 6-5-4-3-2-1  Current Outpatient Medications (Cardiovascular):  Marland Kitchen  EPINEPHrine 0.3 mg/0.3 mL IJ SOAJ injection, as directed.  Current Outpatient Medications (Respiratory):  .  albuterol (PROVENTIL HFA;VENTOLIN HFA) 108 (90 Base) MCG/ACT inhaler, Take 2 puffs by mouth every 6 hours as needed .  ARNUITY ELLIPTA 100 MCG/ACT AEPB,  .  fexofenadine (ALLEGRA) 180 MG tablet, Take 180 mg by mouth daily. .  fluticasone (FLONASE) 50 MCG/ACT nasal spray, 2 sprays by Nasal route daily.   .  montelukast (SINGULAIR) 10 MG tablet, TAKE 1 TABLET IN THE EVENING ONCE A DAY .  Olopatadine HCl (PATANASE) 0.6 % SOLN, by Nasal route. 2 sprays to each nostril daily        Past medical history, social, surgical and family history all reviewed in electronic medical record.  No pertanent information unless stated regarding to the chief complaint.   Review of Systems:  No headache, visual changes, nausea, vomiting, diarrhea, constipation,  dizziness, abdominal pain, skin rash, fevers, chills, night sweats, weight loss, swollen lymph nodes, body aches, joint swelling, muscle aches, chest pain, shortness of breath, mood changes.   Objective  Blood pressure 112/84, pulse 75, height 6\' 1"  (1.854 m), weight 204 lb (92.5 kg), SpO2 98 %.    General: No apparent distress alert and oriented x3 mood and affect normal, dressed appropriately.  HEENT: Pupils equal, extraocular movements intact  Respiratory: Patient's speak in full sentences and does not appear short of breath  Cardiovascular: No lower extremity edema, non tender, no erythema  Skin: Warm dry intact with no signs of infection or rash on extremities or on axial skeleton.  Abdomen: Soft nontender  Neuro: Cranial nerves II through XII are intact, neurovascularly intact in all extremities with 2+ DTRs and 2+ pulses.  Lymph: No lymphadenopathy of posterior or anterior cervical chain or axillae bilaterally.  Gait normal with good balance and coordination.  MSK:  Non tender with full range of motion and good stability and symmetric strength and tone of shoulders, elbows, wrist,  knee and ankles bilaterally.  Hip: Right ROM IR: 5 Deg which is significantly worse than previous exam, ER: 45 Deg, Flexion: 120 Deg, Extension: 100 Deg, Abduction: 35 Deg, Adduction: 15 Deg Strength IR: 5/5, ER: 5/5, Flexion: 5/5, Extension: 5/5, Abduction: 5/5, Adduction: 5/5 Pelvic alignment unremarkable to inspection and palpation. Standing hip rotation and gait without trendelenburg sign / unsteadiness. Pain with internal range of motion No SI joint tenderness and normal minimal SI movement. Contralateral hip unremarkable   Impression and Recommendations:     This case required medical decision making of moderate complexity. The above documentation has been reviewed and is accurate and complete Judi Saa, DO       Note: This dictation was prepared with Dragon dictation along with  smaller phrase technology. Any transcriptional errors that result from this process are unintentional.

## 2017-10-13 ENCOUNTER — Ambulatory Visit: Payer: 59 | Admitting: Family Medicine

## 2017-10-13 ENCOUNTER — Encounter: Payer: Self-pay | Admitting: Family Medicine

## 2017-10-13 ENCOUNTER — Other Ambulatory Visit: Payer: Self-pay

## 2017-10-13 DIAGNOSIS — M25551 Pain in right hip: Secondary | ICD-10-CM | POA: Diagnosis not present

## 2017-10-13 NOTE — Assessment & Plan Note (Signed)
Right hip pain, seems to be localized more to the hip joint itself now.  X-rays were fairly unremarkable except for mild signs of impingement.  MR arthrogram ordered today with patient failing all conservative therapy including formal physical therapy.  Patient has not made any significant improvement but does respond to anti-inflammatories intermittently.  Starting to affect her job and time with his family.  Depending on the MRI we will discuss the possibility of injections or surgical intervention.  Patient would be willing to have surgery if necessary. Spent  25 minutes with patient face-to-face and had greater than 50% of counseling including as described above in assessment and plan.

## 2017-10-13 NOTE — Patient Instructions (Signed)
Good to see you  Ice is your friend We will get MRI of the hip will be ordered Call them at [phone] today or tomorrow and can get it scheduled.  I will write you after and discuss what to do next  For manipulaiton  I should see you again in 4-6 weeks

## 2017-10-19 DIAGNOSIS — J301 Allergic rhinitis due to pollen: Secondary | ICD-10-CM | POA: Diagnosis not present

## 2017-10-19 DIAGNOSIS — J3089 Other allergic rhinitis: Secondary | ICD-10-CM | POA: Diagnosis not present

## 2017-10-19 DIAGNOSIS — J3081 Allergic rhinitis due to animal (cat) (dog) hair and dander: Secondary | ICD-10-CM | POA: Diagnosis not present

## 2017-10-25 DIAGNOSIS — S80861A Insect bite (nonvenomous), right lower leg, initial encounter: Secondary | ICD-10-CM | POA: Diagnosis not present

## 2017-11-09 ENCOUNTER — Ambulatory Visit
Admission: RE | Admit: 2017-11-09 | Discharge: 2017-11-09 | Disposition: A | Discharge status: Home / Self Care | Payer: 59 | Source: Ambulatory Visit | Attending: Family Medicine | Admitting: Family Medicine

## 2017-11-09 ENCOUNTER — Encounter: Payer: Self-pay | Admitting: Family Medicine

## 2017-11-09 DIAGNOSIS — M25551 Pain in right hip: Secondary | ICD-10-CM

## 2017-11-09 DIAGNOSIS — S73191A Other sprain of right hip, initial encounter: Secondary | ICD-10-CM | POA: Diagnosis not present

## 2017-11-09 MED ORDER — IOPAMIDOL (ISOVUE-M 200) INJECTION 41%
15.0000 mL | Freq: Once | INTRAMUSCULAR | Status: AC
  Administered 2017-11-09: 15 mL via INTRA_ARTICULAR

## 2017-11-10 ENCOUNTER — Other Ambulatory Visit: Payer: Self-pay | Admitting: *Deleted

## 2017-11-10 DIAGNOSIS — J3081 Allergic rhinitis due to animal (cat) (dog) hair and dander: Secondary | ICD-10-CM | POA: Diagnosis not present

## 2017-11-10 DIAGNOSIS — J301 Allergic rhinitis due to pollen: Secondary | ICD-10-CM | POA: Diagnosis not present

## 2017-11-10 DIAGNOSIS — J3089 Other allergic rhinitis: Secondary | ICD-10-CM | POA: Diagnosis not present

## 2017-11-10 DIAGNOSIS — M25551 Pain in right hip: Secondary | ICD-10-CM

## 2017-11-12 ENCOUNTER — Ambulatory Visit (INDEPENDENT_AMBULATORY_CARE_PROVIDER_SITE_OTHER): Payer: 59 | Admitting: Orthopaedic Surgery

## 2017-11-12 ENCOUNTER — Encounter (INDEPENDENT_AMBULATORY_CARE_PROVIDER_SITE_OTHER): Payer: Self-pay | Admitting: Orthopaedic Surgery

## 2017-11-12 DIAGNOSIS — S73191A Other sprain of right hip, initial encounter: Secondary | ICD-10-CM | POA: Diagnosis not present

## 2017-11-12 NOTE — Progress Notes (Signed)
Patient is a very pleasant 37 year old gentleman who comes in for evaluation treatment of right hip pain and a known labral tear.  He has had an MRI arthrogram of his right hip and plain films.  He is a Emergency planning/management officer.  He has never had previous hip issues until his hip began hurting in August of this year without any specific event.  It hurts in the groin and hurts with rotation.  It is been sitting for a while and gets up to walk he has pain in his groin and his hip.  He was seen by primary care sports medicine physicians appropriately and sent to [place] for evaluation and treatment of his hip pain.  He denies any other active medical problems.  His wife is with him today.  On exam his left hip moves normally with no pain at all.  His right hip has full range of motion with extremes of internal rotation causes severe pain and he withdraws from this as well due to the pain.  I did review his plain films and his MRI arthrogram of the right hip.  He does have a labral tear with intact cartilage throughout the hip itself.  The labral tear is superior and anterior and appears to have a degenerative component to this.  Is hard to tell with a large flap or not.  From my standpoint he needs to be seen by hip arthroscopy specialist.  We went over his films with him in detail and explained the rationale behind this.  He actually does have an appointment with Dr. Duwayne Heck this Monday.  Of also given him the name of a hip arthroscopy specialist in [place].  Will be as needed.  All question concerns were answered and addressed.

## 2017-11-16 DIAGNOSIS — M24151 Other articular cartilage disorders, right hip: Secondary | ICD-10-CM | POA: Diagnosis not present

## 2017-11-18 DIAGNOSIS — J101 Influenza due to other identified influenza virus with other respiratory manifestations: Secondary | ICD-10-CM | POA: Diagnosis not present

## 2017-11-20 ENCOUNTER — Encounter: Payer: Self-pay | Admitting: Family Medicine

## 2017-11-20 ENCOUNTER — Ambulatory Visit: Payer: 59 | Admitting: Family Medicine

## 2017-11-20 VITALS — BP 120/74 | HR 94 | Temp 98.6°F | Ht 73.0 in | Wt 203.0 lb

## 2017-11-20 DIAGNOSIS — J40 Bronchitis, not specified as acute or chronic: Secondary | ICD-10-CM | POA: Diagnosis not present

## 2017-11-20 MED ORDER — BENZONATATE 200 MG PO CAPS: 200.0000 mg | ORAL_CAPSULE | Freq: Two times a day (BID) | ORAL | 0 refills | Status: DC | PRN

## 2017-11-20 MED ORDER — METHYLPREDNISOLONE ACETATE 80 MG/ML IJ SUSP
80.0000 mg | Freq: Once | INTRAMUSCULAR | Status: AC
  Administered 2017-11-20: 80 mg via INTRAMUSCULAR

## 2017-11-20 MED ORDER — AZITHROMYCIN 250 MG PO TABS: ORAL_TABLET | ORAL | 0 refills | Status: DC

## 2017-11-20 NOTE — Patient Instructions (Signed)
Start the zpack.  Start tessalon for your cough.  We will give you an injection of an anti-inflammatory steroid today.   Please stay well hydrated.  You can take tylenol and/or motrin as needed for low grade fever and pain.  Please let me know if your symptoms worsen or fail to improve.  Take care, Dr Jimmey RalphParker

## 2017-11-20 NOTE — Progress Notes (Signed)
   Subjective:  Tony [name] is a 37 y.o. male who presents today for same-day appointment with a chief complaint of cough.   HPI:  Cough, Acute problem Started 2-3 weeks ago. Worsening over that time.Associated with sputum production, malaise, wheezing, myalgias, and chills.  Few days ago he was diagnosed with flu by a telemedicine doctor and started on Tamiflu.  This caused severe nausea and vomiting and he stopped the medication.  He has tried taking several over-the-counter medications with modest improvement. He has also tried taking prednisone without significant improvement.  Symptoms are worse at night. No other obvious alleviating or aggravating factors.   ROS: Per HPI  PMH: He reports that he has never smoked. He has never used smokeless tobacco. He reports that he drinks alcohol. He reports that he does not use drugs.  Objective:  Physical Exam: BP 120/74 (BP Location: Left Arm, Patient Position: Sitting, Cuff Size: Normal)   Pulse 94   Temp 98.6 F (37 C) (Oral)   Ht 6\' 1"  (1.854 m)   Wt 203 lb (92.1 kg)   SpO2 96%   BMI 26.78 kg/m   Gen: NAD, resting comfortably HEENT: TMs with clear effusion.  Oropharynx erythematous.  No lymphadenopathy. CV: RRR with no murmurs appreciated Pulm: NWOB, bibasilar rhonchi with faint and expiratory wheeze in bases.  Assessment/Plan:  Bronchitis Given that symptoms have been persistent for the past 3 to 4 weeks in addition to his pulmonary exam findings, we will start Z-Pak today.  Also start Tessalon for his cough.  Will give 80 mg of IM Depo-Medrol as well.  Recommended good oral hydration.  Recommended Tylenol/Motrin as needed.  Discussed reasons to return to care.  Follow-up as needed.  Katina Degreealeb M. Jimmey RalphParker, MD 11/20/2017 1:09 PM

## 2017-11-23 ENCOUNTER — Other Ambulatory Visit: Payer: Self-pay | Admitting: Allergy and Immunology

## 2017-11-23 ENCOUNTER — Ambulatory Visit
Admission: RE | Admit: 2017-11-23 | Discharge: 2017-11-23 | Disposition: A | Discharge status: Home / Self Care | Payer: 59 | Source: Ambulatory Visit | Attending: Allergy and Immunology | Admitting: Allergy and Immunology

## 2017-11-23 DIAGNOSIS — J4540 Moderate persistent asthma, uncomplicated: Secondary | ICD-10-CM

## 2017-11-23 DIAGNOSIS — R05 Cough: Secondary | ICD-10-CM | POA: Diagnosis not present

## 2017-11-23 NOTE — Progress Notes (Deleted)
[name] [address] [place], [place] [address] Phone: [phone] Subjective:    I'm seeing this patient by the request  of:    CC:   [name]  Tony [name] is a 37 y.o. male coming in with complaint of ***  Onset-  Location Duration-  Character- Aggravating factors- Reliving factors-  Therapies tried-  Severity-     Past Medical History:  Diagnosis Date  . ALLERGIC RHINITIS 09/17/2009  . ASTHMA 09/17/2009  . HYPERLIPIDEMIA 09/17/2009   Past Surgical History:  Procedure Laterality Date  . NASAL SINUS SURGERY Right 12/04/2014   Dr. Jenne [name]  . SINUS EXPLORATION  Nov 2014    per Dr. Jenne [name]    Social History   Socioeconomic History  . Marital status: Married    Spouse name: Not on file  . Number of children: Not on file  . Years of education: Not on file  . Highest education level: Not on file  Occupational History  . Not on file  Social Needs  . Financial resource strain: Not on file  . Food insecurity:    Worry: Not on file    Inability: Not on file  . Transportation needs:    Medical: Not on file    Non-medical: Not on file  Tobacco Use  . Smoking status: Never Smoker  . Smokeless tobacco: Never Used  Substance and Sexual Activity  . Alcohol use: Yes    Comment: occ  . Drug use: No  . Sexual activity: Not on file  Lifestyle  . Physical activity:    Days per week: Not on file    Minutes per session: Not on file  . Stress: Not on file  Relationships  . Social connections:    Talks on phone: Not on file    Gets together: Not on file    Attends religious service: Not on file    Active member of club or organization: Not on file    Attends meetings of clubs or organizations: Not on file    Relationship status: Not on file  Other Topics Concern  . Not on file  Social History Narrative   Married. 2 children. Tony [name] (May 2016). Tony [name] (July 2013).    Wife works as Emergency planning/management officer since 2012      Hobbies: time with sons, going to gym- [employer] 7 days a week   [name]   No Known Allergies Family History  Problem Relation Age of Onset  . Healthy Mother   . Sinusitis Father        recurrent- sinus surgery multiple. rare fungal infection 20 cases in world.   Marland Kitchen CAD Father        age 24 first heart attack. never smoker  . Alcohol abuse Father   . Depression Father        abused pain pills in past  . Atrial fibrillation Father   . Kidney disease Father        lifestyle/treatment of sinus issues related  . Healthy Brother      Current Outpatient Medications (Cardiovascular):  Marland Kitchen  EPINEPHrine 0.3 mg/0.3 mL IJ SOAJ injection, as directed.  Current Outpatient Medications (Respiratory):  .  albuterol (PROVENTIL HFA;VENTOLIN HFA) 108 (90 Base) MCG/ACT inhaler, Take 2 puffs by mouth every 6 hours as needed .  ARNUITY ELLIPTA 100 MCG/ACT AEPB,  .  benzonatate (TESSALON) 200 MG capsule, Take 1 capsule (200 mg total) by mouth  2 (two) times daily as needed for cough. .  budesonide-formoterol (SYMBICORT) 160-4.5 MCG/ACT inhaler, Inhale 2 puffs into the lungs 2 (two) times daily. .  fexofenadine (ALLEGRA) 180 MG tablet, Take 180 mg by mouth daily. .  fluticasone (FLONASE) 50 MCG/ACT nasal spray, 2 sprays by Nasal route daily.   .  montelukast (SINGULAIR) 10 MG tablet, TAKE 1 TABLET IN THE EVENING ONCE A DAY .  Olopatadine HCl (PATANASE) 0.6 % SOLN, by Nasal route. 2 sprays to each nostril daily     Current Outpatient Medications (Other):  .  azithromycin (ZITHROMAX) 250 MG tablet, Take 2 tabs day 1, then 1 tab daily    Past medical history, social, surgical and family history all reviewed in electronic medical record.  No pertanent information unless stated regarding to the chief complaint.   Review of Systems:  No headache, visual changes, nausea, vomiting, diarrhea, constipation, dizziness, abdominal pain, skin rash, fevers, chills, night sweats, weight loss, swollen lymph  nodes, body aches, joint swelling, muscle aches, chest pain, shortness of breath, mood changes.   Objective  There were no vitals taken for this visit. Systems examined below as of    General: No apparent distress alert and oriented x3 mood and affect normal, dressed appropriately.  HEENT: Pupils equal, extraocular movements intact  Respiratory: Patient's speak in full sentences and does not appear short of breath  Cardiovascular: No lower extremity edema, non tender, no erythema  Skin: Warm dry intact with no signs of infection or rash on extremities or on axial skeleton.  Abdomen: Soft nontender  Neuro: Cranial nerves II through XII are intact, neurovascularly intact in all extremities with 2+ DTRs and 2+ pulses.  Lymph: No lymphadenopathy of posterior or anterior cervical chain or axillae bilaterally.  Gait normal with good balance and coordination.  MSK:  Non tender with full range of motion and good stability and symmetric strength and tone of shoulders, elbows, wrist, hip, knee and ankles bilaterally.     Impression and Recommendations:     This case required medical decision making of moderate complexity. The above documentation has been reviewed and is accurate and complete Tony SaaZachary M British Moyd, DO       Note: This dictation was prepared with Dragon dictation along with smaller phrase technology. Any transcriptional errors that result from this process are unintentional.

## 2017-11-24 ENCOUNTER — Ambulatory Visit: Payer: 59 | Admitting: Family Medicine

## 2017-12-11 DIAGNOSIS — J3081 Allergic rhinitis due to animal (cat) (dog) hair and dander: Secondary | ICD-10-CM | POA: Diagnosis not present

## 2017-12-11 DIAGNOSIS — J3089 Other allergic rhinitis: Secondary | ICD-10-CM | POA: Diagnosis not present

## 2017-12-11 DIAGNOSIS — J301 Allergic rhinitis due to pollen: Secondary | ICD-10-CM | POA: Diagnosis not present

## 2017-12-15 DIAGNOSIS — M25551 Pain in right hip: Secondary | ICD-10-CM | POA: Diagnosis not present

## 2017-12-16 DIAGNOSIS — J3081 Allergic rhinitis due to animal (cat) (dog) hair and dander: Secondary | ICD-10-CM | POA: Diagnosis not present

## 2017-12-16 DIAGNOSIS — J3089 Other allergic rhinitis: Secondary | ICD-10-CM | POA: Diagnosis not present

## 2017-12-16 DIAGNOSIS — J301 Allergic rhinitis due to pollen: Secondary | ICD-10-CM | POA: Diagnosis not present

## 2017-12-22 ENCOUNTER — Ambulatory Visit: Payer: Self-pay | Admitting: Family Medicine

## 2017-12-24 DIAGNOSIS — J301 Allergic rhinitis due to pollen: Secondary | ICD-10-CM | POA: Diagnosis not present

## 2017-12-24 DIAGNOSIS — J3089 Other allergic rhinitis: Secondary | ICD-10-CM | POA: Diagnosis not present

## 2017-12-24 DIAGNOSIS — J3081 Allergic rhinitis due to animal (cat) (dog) hair and dander: Secondary | ICD-10-CM | POA: Diagnosis not present

## 2018-01-01 DIAGNOSIS — J3089 Other allergic rhinitis: Secondary | ICD-10-CM | POA: Diagnosis not present

## 2018-01-01 DIAGNOSIS — J301 Allergic rhinitis due to pollen: Secondary | ICD-10-CM | POA: Diagnosis not present

## 2018-01-01 DIAGNOSIS — J3081 Allergic rhinitis due to animal (cat) (dog) hair and dander: Secondary | ICD-10-CM | POA: Diagnosis not present

## 2018-01-05 DIAGNOSIS — M25551 Pain in right hip: Secondary | ICD-10-CM | POA: Diagnosis not present

## 2018-01-14 ENCOUNTER — Ambulatory Visit: Payer: Self-pay | Admitting: Family Medicine

## 2018-01-14 DIAGNOSIS — J3081 Allergic rhinitis due to animal (cat) (dog) hair and dander: Secondary | ICD-10-CM | POA: Diagnosis not present

## 2018-01-14 DIAGNOSIS — J3089 Other allergic rhinitis: Secondary | ICD-10-CM | POA: Diagnosis not present

## 2018-01-14 DIAGNOSIS — J301 Allergic rhinitis due to pollen: Secondary | ICD-10-CM | POA: Diagnosis not present

## 2018-01-19 DIAGNOSIS — J3081 Allergic rhinitis due to animal (cat) (dog) hair and dander: Secondary | ICD-10-CM | POA: Diagnosis not present

## 2018-01-19 DIAGNOSIS — J301 Allergic rhinitis due to pollen: Secondary | ICD-10-CM | POA: Diagnosis not present

## 2018-01-19 DIAGNOSIS — J3089 Other allergic rhinitis: Secondary | ICD-10-CM | POA: Diagnosis not present

## 2018-01-28 ENCOUNTER — Encounter: Payer: Self-pay | Admitting: Family Medicine

## 2018-01-28 ENCOUNTER — Ambulatory Visit: Payer: 59 | Admitting: Family Medicine

## 2018-01-28 VITALS — BP 102/70 | HR 83 | Ht 73.0 in

## 2018-01-28 DIAGNOSIS — M533 Sacrococcygeal disorders, not elsewhere classified: Secondary | ICD-10-CM

## 2018-01-28 DIAGNOSIS — S73191S Other sprain of right hip, sequela: Secondary | ICD-10-CM | POA: Diagnosis not present

## 2018-01-28 DIAGNOSIS — S73191A Other sprain of right hip, initial encounter: Secondary | ICD-10-CM | POA: Insufficient documentation

## 2018-01-28 DIAGNOSIS — M999 Biomechanical lesion, unspecified: Secondary | ICD-10-CM | POA: Diagnosis not present

## 2018-01-28 NOTE — Assessment & Plan Note (Signed)
Decision today to treat with OMT was based on Physical Exam  After verbal consent patient was treated with HVLA, ME, FPR techniques in  thoracic, lumbar and sacral areas  Patient tolerated the procedure well with improvement in symptoms  Patient given exercises, stretches and lifestyle modifications  See medications in patient instructions if given  Patient will follow up in 4-6 weeks 

## 2018-01-28 NOTE — Assessment & Plan Note (Signed)
Sacroiliac dysfunction.  Discussed icing regimen and home exercise.  Discussed which activities to do which wants to avoid.  Increase activity slowly over the course of the last 4 weeks.  Patient will care posture and ergonomic exercises little more as well.  Encourage weight loss.  Follow-up again in 4 to 6 weeks

## 2018-01-28 NOTE — Progress Notes (Signed)
[name] [name], [name] [address] Phone: [phone] Subjective:   Tony [name], am serving as a scribe for Dr. Hulan Saas.   CC: Back pain and hip pain follow-up  [id number]  Tony [name] is a 38 y.o. male coming in with complaint of right hip pain. Has met with an orthopaedic surgeon. Was told that he will need surgery eventually. Is opting to use conservative management at this time. Patient was given an ultrasound guided hip injection which did alleviate some of his pain. Has continued to be active despite pain and modifies activities as needed. Also complains of left lower back pain that coincides with the right hip pain.  Patient is still having the low back pain.  Has responded well to manipulation.  Patient has been trying to be active but finding it difficult.  Patient was to try to do more strengthening exercises but found it difficult with the holidays.     Past Medical History:  Diagnosis Date  . ALLERGIC RHINITIS 09/17/2009  . ASTHMA 09/17/2009  . HYPERLIPIDEMIA 09/17/2009   Past Surgical History:  Procedure Laterality Date  . NASAL SINUS SURGERY Right 12/04/2014   Dr. Redmond Baseman  . SINUS EXPLORATION  Nov 2014    per Dr. Redmond Baseman    Social History   Socioeconomic History  . Marital status: Married    Spouse name: Not on file  . Number of children: Not on file  . Years of education: Not on file  . Highest education level: Not on file  Occupational History  . Not on file  Social Needs  . Financial resource strain: Not on file  . Food insecurity:    Worry: Not on file    Inability: Not on file  . Transportation needs:    Medical: Not on file    Non-medical: Not on file  Tobacco Use  . Smoking status: Never Smoker  . Smokeless tobacco: Never Used  Substance and Sexual Activity  . Alcohol use: Yes    Comment: occ  . Drug use: [name]  . Sexual activity: Not on file  Lifestyle  . Physical activity:    Days  per week: Not on file    Minutes per session: Not on file  . Stress: Not on file  Relationships  . Social connections:    Talks on phone: Not on file    Gets together: Not on file    Attends religious service: Not on file    Active member of club or organization: Not on file    Attends meetings of clubs or organizations: Not on file    Relationship status: Not on file  Other Topics Concern  . Not on file  Social History Narrative   Married. 2 children. Vonna Kotyk (May 2016). Edison Nasuti (July 2013).    Wife works as Patent attorney since 7209      Hobbies: time with sons, going to gym- [employer] 7 days a week   [employer]   [name] Known Allergies Family History  Problem Relation Age of Onset  . Healthy Mother   . Sinusitis Father        recurrent- sinus surgery multiple. rare fungal infection 20 cases in world.   Marland Kitchen CAD Father        age 45 first heart attack. never smoker  . Alcohol abuse Father   . Depression Father        abused pain  pills in past  . Atrial fibrillation Father   . Kidney disease Father        lifestyle/treatment of sinus issues related  . Healthy Brother      Current Outpatient Medications (Cardiovascular):  Marland Kitchen  EPINEPHrine 0.3 mg/0.3 mL IJ SOAJ injection, as directed.  Current Outpatient Medications (Respiratory):  .  albuterol (PROVENTIL HFA;VENTOLIN HFA) 108 (90 Base) MCG/ACT inhaler, Take 2 puffs by mouth every 6 hours as needed .  ARNUITY ELLIPTA 100 MCG/ACT AEPB,  .  benzonatate (TESSALON) 200 MG capsule, Take 1 capsule (200 mg total) by mouth 2 (two) times daily as needed for cough. .  budesonide-formoterol (SYMBICORT) 160-4.5 MCG/ACT inhaler, Inhale 2 puffs into the lungs 2 (two) times daily. .  fexofenadine (ALLEGRA) 180 MG tablet, Take 180 mg by mouth daily. .  fluticasone (FLONASE) 50 MCG/ACT nasal spray, 2 sprays by Nasal route daily.   .  montelukast (SINGULAIR) 10 MG tablet, TAKE 1 TABLET IN THE EVENING ONCE A DAY .  Olopatadine HCl  (PATANASE) 0.6 % SOLN, by Nasal route. 2 sprays to each nostril daily     Current Outpatient Medications (Other):  .  azithromycin (ZITHROMAX) 250 MG tablet, Take 2 tabs day 1, then 1 tab daily    Past medical history, social, surgical and family history all reviewed in electronic medical record.  [name] pertanent information unless stated regarding to the chief complaint.   Review of Systems:  [name] headache, visual changes, nausea, vomiting, diarrhea, constipation, dizziness, abdominal pain, skin rash, fevers, chills, night sweats, weight loss, swollen lymph nodes, body aches, joint swelling, muscle aches, chest pain, shortness of breath, mood changes.   Objective  There were [name] vitals taken for this visit. Systems examined below as of    General: [name] apparent distress alert and oriented x3 mood and affect normal, dressed appropriately.  HEENT: Pupils equal, extraocular movements intact  Respiratory: Patient's speak in full sentences and does not appear short of breath  Cardiovascular: [name] lower extremity edema, non tender, [name] erythema  Skin: Warm dry intact with [name] signs of infection or rash on extremities or on axial skeleton.  Abdomen: Soft nontender  Neuro: Cranial nerves II through XII are intact, neurovascularly intact in all extremities with 2+ DTRs and 2+ pulses.  Lymph: [name] lymphadenopathy of posterior or anterior cervical chain or axillae bilaterally.  Gait normal with good balance and coordination.  MSK:  Non tender with full range of motion and good stability and symmetric strength and tone of shoulders, elbows, wrist, hip, knee and ankles bilaterally.  Back Exam:  Inspection: Loss of lordosis Motion: Flexion 45 deg, Extension 25 deg, Side Bending to 45 deg bilaterally,  Rotation to 35 deg bilaterally  SLR laying: Negative  XSLR laying: Negative  Palpable tenderness: Tender to palpation in the paraspinal musculature. FABER: Tightness on the right mild pain in the right hip and  groin area with internal rotation. Sensory change: Gross sensation intact to all lumbar and sacral dermatomes.  Reflexes: 2+ at both patellar tendons, 2+ at achilles tendons, Babinski's downgoing.  Strength at foot  Plantar-flexion: 5/5 Dorsi-flexion: 5/5 Eversion: 5/5 Inversion: 5/5  Leg strength  Quad: 5/5 Hamstring: 5/5 Hip flexor: 5/5 Hip abductors: 5/5  Gait unremarkable.  Osteopathic findings T6 extended rotated and side bent left L3 flexed rotated and side bent right Sacrum right on right    Impression and Recommendations:     This case required medical decision making of moderate complexity. The above documentation has been  reviewed and is accurate and complete Lyndal Pulley, DO       Note: This dictation was prepared with Dragon dictation along with smaller phrase technology. Any transcriptional errors that result from this process are unintentional.

## 2018-01-28 NOTE — Patient Instructions (Signed)
Good to see you  Overall doing well  Ice is your friend when needed No jumping Work a little on the weight  See me again In 6-8 weeks

## 2018-02-09 DIAGNOSIS — J3081 Allergic rhinitis due to animal (cat) (dog) hair and dander: Secondary | ICD-10-CM | POA: Diagnosis not present

## 2018-02-09 DIAGNOSIS — J3089 Other allergic rhinitis: Secondary | ICD-10-CM | POA: Diagnosis not present

## 2018-02-09 DIAGNOSIS — J301 Allergic rhinitis due to pollen: Secondary | ICD-10-CM | POA: Diagnosis not present

## 2018-02-15 DIAGNOSIS — J3089 Other allergic rhinitis: Secondary | ICD-10-CM | POA: Diagnosis not present

## 2018-02-18 DIAGNOSIS — J301 Allergic rhinitis due to pollen: Secondary | ICD-10-CM | POA: Diagnosis not present

## 2018-02-18 DIAGNOSIS — J3089 Other allergic rhinitis: Secondary | ICD-10-CM | POA: Diagnosis not present

## 2018-02-18 DIAGNOSIS — J3081 Allergic rhinitis due to animal (cat) (dog) hair and dander: Secondary | ICD-10-CM | POA: Diagnosis not present

## 2018-03-04 DIAGNOSIS — J301 Allergic rhinitis due to pollen: Secondary | ICD-10-CM | POA: Diagnosis not present

## 2018-03-04 DIAGNOSIS — J3089 Other allergic rhinitis: Secondary | ICD-10-CM | POA: Diagnosis not present

## 2018-03-04 DIAGNOSIS — J3081 Allergic rhinitis due to animal (cat) (dog) hair and dander: Secondary | ICD-10-CM | POA: Diagnosis not present

## 2018-03-10 ENCOUNTER — Encounter: Payer: Self-pay | Admitting: Family Medicine

## 2018-03-10 ENCOUNTER — Ambulatory Visit: Payer: 59 | Admitting: Family Medicine

## 2018-03-10 VITALS — BP 124/80 | HR 72 | Ht 73.0 in | Wt 205.0 lb

## 2018-03-10 DIAGNOSIS — M999 Biomechanical lesion, unspecified: Secondary | ICD-10-CM | POA: Diagnosis not present

## 2018-03-10 DIAGNOSIS — M533 Sacrococcygeal disorders, not elsewhere classified: Secondary | ICD-10-CM | POA: Diagnosis not present

## 2018-03-10 NOTE — Assessment & Plan Note (Signed)
Sacroiliac joint still likely compensating for some of the hip labral tear.  Believe that the back pain may get better after the hip being fixed as well.  Discussed with patient in great length.  Discussed hip abductor strengthening.  Patient wants to continue with conservative therapy.  Follow-up with me again in 6-week intervals.

## 2018-03-10 NOTE — Progress Notes (Signed)
[name] [address] [place], [place] [address] Phone: [phone] Subjective:   [name] am serving as a Neurosurgeon for Dr. Antoine [name].   CC: Back pain follow-up.  [id number]  Tony [name] is a 38 y.o. male coming in with complaint of back pain. States that his back is a little sore. Hip pain comes and goes.  Patient has been doing relatively well.  Patient is also had more of a hip labral tear and is considering still having it surgically repaired.  Patient is looking to have another opinion on this.  Patient though has been sitting more often and because of that more tightness in the back.  Not has as active as he would like to be.  Has a trip on a [employer] cruise line in the near future.  Wants to see how he does.     Past Medical History:  Diagnosis Date  . ALLERGIC RHINITIS 09/17/2009  . ASTHMA 09/17/2009  . HYPERLIPIDEMIA 09/17/2009   Past Surgical History:  Procedure Laterality Date  . NASAL SINUS SURGERY Right 12/04/2014   Dr. Jenne [name]  . SINUS EXPLORATION  Nov 2014    per Dr. Jenne [name]    Social History   Socioeconomic History  . Marital status: Married    Spouse name: Not on file  . Number of children: Not on file  . Years of education: Not on file  . Highest education level: Not on file  Occupational History  . Not on file  Social Needs  . Financial resource strain: Not on file  . Food insecurity:    Worry: Not on file    Inability: Not on file  . Transportation needs:    Medical: Not on file    Non-medical: Not on file  Tobacco Use  . Smoking status: Never Smoker  . Smokeless tobacco: Never Used  Substance and Sexual Activity  . Alcohol use: Yes    Comment: occ  . Drug use: No  . Sexual activity: Not on file  Lifestyle  . Physical activity:    Days per week: Not on file    Minutes per session: Not on file  . Stress: Not on file  Relationships  . Social connections:    Talks on phone: Not on file    Gets  together: Not on file    Attends religious service: Not on file    Active member of club or organization: Not on file    Attends meetings of clubs or organizations: Not on file    Relationship status: Not on file  Other Topics Concern  . Not on file  Social History Narrative   Married. 2 children. Tony [name] (May 2016). Tony [name] (July 2013).    Wife works as Emergency planning/management officer since 2012      Hobbies: time with sons, going to gym- [employer] 7 days a week   [name]   No Known Allergies Family History  Problem Relation Age of Onset  . Healthy Mother   . Sinusitis Father        recurrent- sinus surgery multiple. rare fungal infection 20 cases in world.   Marland Kitchen CAD Father        age 44 first heart attack. never smoker  . Alcohol abuse Father   . Depression Father        abused pain pills in past  . Atrial fibrillation Father   . Kidney disease Father  lifestyle/treatment of sinus issues related  . Healthy Brother      Current Outpatient Medications (Cardiovascular):  Marland Kitchen  EPINEPHrine 0.3 mg/0.3 mL IJ SOAJ injection, as directed.  Current Outpatient Medications (Respiratory):  .  albuterol (PROVENTIL HFA;VENTOLIN HFA) 108 (90 Base) MCG/ACT inhaler, Take 2 puffs by mouth every 6 hours as needed .  ARNUITY ELLIPTA 100 MCG/ACT AEPB,  .  benzonatate (TESSALON) 200 MG capsule, Take 1 capsule (200 mg total) by mouth 2 (two) times daily as needed for cough. .  budesonide-formoterol (SYMBICORT) 160-4.5 MCG/ACT inhaler, Inhale 2 puffs into the lungs 2 (two) times daily. .  fexofenadine (ALLEGRA) 180 MG tablet, Take 180 mg by mouth daily. .  fluticasone (FLONASE) 50 MCG/ACT nasal spray, 2 sprays by Nasal route daily.   .  montelukast (SINGULAIR) 10 MG tablet, TAKE 1 TABLET IN THE EVENING ONCE A DAY .  Olopatadine HCl (PATANASE) 0.6 % SOLN, by Nasal route. 2 sprays to each nostril daily     Current Outpatient Medications (Other):  .  azithromycin (ZITHROMAX) 250 MG tablet, Take 2  tabs day 1, then 1 tab daily    Past medical history, social, surgical and family history all reviewed in electronic medical record.  No pertanent information unless stated regarding to the chief complaint.   Review of Systems:  No headache, visual changes, nausea, vomiting, diarrhea, constipation, dizziness, abdominal pain, skin rash, fevers, chills, night sweats, weight loss, swollen lymph nodes, body aches, joint swelling,  chest pain, shortness of breath, mood changes.  Positive muscle aches  Objective  Blood pressure 124/80, pulse 72, height 6\' 1"  (1.854 m), weight 205 lb (93 kg), SpO2 98 %. Systems examined below as of    General: No apparent distress alert and oriented x3 mood and affect normal, dressed appropriately.  HEENT: Pupils equal, extraocular movements intact  Respiratory: Patient's speak in full sentences and does not appear short of breath  Cardiovascular: No lower extremity edema, non tender, no erythema  Skin: Warm dry intact with no signs of infection or rash on extremities or on axial skeleton.  Abdomen: Soft nontender  Neuro: Cranial nerves II through XII are intact, neurovascularly intact in all extremities with 2+ DTRs and 2+ pulses.  Lymph: No lymphadenopathy of posterior or anterior cervical chain or axillae bilaterally.  Gait normal with good balance and coordination.  MSK:  Non tender with full range of motion and good stability and symmetric strength and tone of shoulders, elbows, wrist, , knee and ankles bilaterally.  Back Exam:  Inspection: Unremarkable  Motion: Flexion 25 deg, Extension 15 deg, Side Bending to 45 deg bilaterally,  Rotation to 45 deg bilaterally  SLR laying: Negative  XSLR laying: Negative  Palpable tenderness: Tender to palpation more over the right sacroiliac joint.  Does have some decreased range of motion of the right hip secondary to pain with internal rotation.Marland Kitchen FABER: Right. Sensory change: Gross sensation intact to all lumbar and  sacral dermatomes.  Reflexes: 2+ at both patellar tendons, 2+ at achilles tendons, Babinski's downgoing.  Strength at foot  Plantar-flexion: 5/5 Dorsi-flexion: 5/5 Eversion: 5/5 Inversion: 5/5  Leg strength  Quad: 5/5 Hamstring: 5/5 Hip flexor: 5/5 Hip abductors: 5/5    Osteopathic findings C2 flexed rotated and side bent right C4 flexed rotated and side bent left T3 extended rotated and side bent right inhaled third rib T9 extended rotated and side bent left L2 flexed rotated and side bent right Sacrum right on right     Impression and  Recommendations:     This case required medical decision making of moderate complexity. The above documentation has been reviewed and is accurate and complete Lyndal Pulley, DO       Note: This dictation was prepared with Dragon dictation along with smaller phrase technology. Any transcriptional errors that result from this process are unintentional.

## 2018-03-10 NOTE — Patient Instructions (Addendum)
Good to see you  Ice is your friend Stay active Have fun with Tony [name]  See me again in 6-8 weeks!

## 2018-03-10 NOTE — Assessment & Plan Note (Signed)
Decision today to treat with OMT was based on Physical Exam  After verbal consent patient was treated with HVLA, ME, FPR techniques in , thoracic, lumbar and sacral areas  Patient tolerated the procedure well with improvement in symptoms  Patient given exercises, stretches and lifestyle modifications  See medications in patient instructions if given  Patient will follow up in 6 weeks

## 2018-04-27 NOTE — Progress Notes (Signed)
[name] Mirra Basilio D.O. [facility] [address] [place], [place] [address] Phone: [phone] Subjective:   I Tony NighKana Thompson am serving as a Neurosurgeonscribe for Dr. Antoine PrimasZachary Guillermo Nehring.   CC: Back pain follow-up  BJY:[id number]:Subjective  Tony [name] is a 38 y.o. male coming in with complaint of back pain. States that he has being having some hip issues.  Patient states that the low back and the hip is been giving him more difficulties recently.  Has not been working out regularly.  Rates the severity of pain is 8 out of 10.    Denies any new injuries.  Feels respond well to manipulation in the past would like to consider that today.      Past Medical History:  Diagnosis Date  . ALLERGIC RHINITIS 09/17/2009  . ASTHMA 09/17/2009  . HYPERLIPIDEMIA 09/17/2009   Past Surgical History:  Procedure Laterality Date  . NASAL SINUS SURGERY Right 12/04/2014   Dr. Jenne PaneBates  . SINUS EXPLORATION  Nov 2014    per Dr. Jenne PaneBates    Social History   Socioeconomic History  . Marital status: Married    Spouse name: Not on file  . Number of children: Not on file  . Years of education: Not on file  . Highest education level: Not on file  Occupational History  . Not on file  Social Needs  . Financial resource strain: Not on file  . Food insecurity:    Worry: Not on file    Inability: Not on file  . Transportation needs:    Medical: Not on file    Non-medical: Not on file  Tobacco Use  . Smoking status: Never Smoker  . Smokeless tobacco: Never Used  Substance and Sexual Activity  . Alcohol use: Yes    Comment: occ  . Drug use: No  . Sexual activity: Not on file  Lifestyle  . Physical activity:    Days per week: Not on file    Minutes per session: Not on file  . Stress: Not on file  Relationships  . Social connections:    Talks on phone: Not on file    Gets together: Not on file    Attends religious service: Not on file    Active member of club or organization: Not on file    Attends meetings of  clubs or organizations: Not on file    Relationship status: Not on file  Other Topics Concern  . Not on file  Social History Narrative   Married. 2 children. Tony [name] (May 2016). Tony [name] (July 2013).    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 Healthy Mother   . Sinusitis Father        recurrent- sinus surgery multiple. rare fungal infection 20 cases in world.   Tony [name]. CAD Father        age 36 first heart attack. never smoker  . Alcohol abuse Father   . Depression Father        abused pain pills in past  . Atrial fibrillation Father   . Kidney disease Father        lifestyle/treatment of sinus issues related  . Healthy Brother      Current Outpatient Medications (Cardiovascular):  Tony [name].  EPINEPHrine 0.3 mg/0.3  mL IJ SOAJ injection, as directed.  Current Outpatient Medications (Respiratory):  .  albuterol (PROVENTIL HFA;VENTOLIN HFA) 108 (90 Base) MCG/ACT inhaler, Take 2 puffs by mouth every 6 hours as needed .  ARNUITY ELLIPTA 100 MCG/ACT AEPB,  .  benzonatate (TESSALON) 200 MG capsule, Take 1 capsule (200 mg total) by mouth 2 (two) times daily as needed for cough. .  budesonide-formoterol (SYMBICORT) 160-4.5 MCG/ACT inhaler, Inhale 2 puffs into the lungs 2 (two) times daily. .  fexofenadine (ALLEGRA) 180 MG tablet, Take 180 mg by mouth daily. .  fluticasone (FLONASE) 50 MCG/ACT nasal spray, 2 sprays by Nasal route daily.   .  montelukast (SINGULAIR) 10 MG tablet, TAKE 1 TABLET IN THE EVENING ONCE A DAY .  Olopatadine HCl (PATANASE) 0.6 % SOLN, by Nasal route. 2 sprays to each nostril daily     Current Outpatient Medications (Other):  .  azithromycin (ZITHROMAX) 250 MG tablet, Take 2 tabs day 1, then 1 tab daily    Past medical history, social, surgical and family history all reviewed in electronic medical record.  No  pertanent information unless stated regarding to the chief complaint.   Review of Systems:  No headache, visual changes, nausea, vomiting, diarrhea, constipation, dizziness, abdominal pain, skin rash, fevers, chills, night sweats, weight loss, swollen lymph nodes, body aches, joint swelling, muscle aches, chest pain, shortness of breath, mood changes.   Objective  There were no vitals taken for this visit. Systems examined below as of    General: No apparent distress alert and oriented x3 mood and affect normal, dressed appropriately.  HEENT: Pupils equal, extraocular movements intact  Respiratory: Patient's speak in full sentences and does not appear short of breath  Cardiovascular: No lower extremity edema, non tender, no erythema  Skin: Warm dry intact with no signs of infection or rash on extremities or on axial skeleton.  Abdomen: Soft nontender  Neuro: Cranial nerves II through XII are intact, neurovascularly intact in all extremities with 2+ DTRs and 2+ pulses.  Lymph: No lymphadenopathy of posterior or anterior cervical chain or axillae bilaterally.  Gait normal with good balance and coordination.  MSK:  Non tender with full range of motion and good stability and symmetric strength and tone of shoulders, elbows, wrist, hip, knee and ankles bilaterally.  Back Exam:  Inspection: Unremarkable  Motion: Flexion 45 deg, Extension 25 deg, Side Bending to 35 deg bilaterally,  Rotation to 45 deg bilaterally  SLR laying: Negative  XSLR laying: Negative  Palpable tenderness: Tightness of the lower back. FABER: Tight bilaterally. Sensory change: Gross sensation intact to all lumbar and sacral dermatomes.  Reflexes: 2+ at both patellar tendons, 2+ at achilles tendons, Babinski's downgoing.  Strength at foot  Plantar-flexion: 5/5 Dorsi-flexion: 5/5 Eversion: 5/5 Inversion: 5/5  Leg strength  Quad: 5/5 Hamstring: 5/5 Hip flexor: 5/5 Hip abductors: 5/5  Gait unremarkable.  Osteopathic  findings T6 extended rotated and side bent left L2 flexed rotated and side bent right Sacrum right on right    Impression and Recommendations:     This case required medical decision making of moderate complexity. The above documentation has been reviewed and is accurate and complete Judi Saa, DO       Note: This dictation was prepared with Dragon dictation along with smaller phrase technology. Any transcriptional errors that result from this process are unintentional.

## 2018-04-28 ENCOUNTER — Ambulatory Visit: Payer: Self-pay | Admitting: Family Medicine

## 2018-04-29 ENCOUNTER — Other Ambulatory Visit: Payer: Self-pay

## 2018-04-29 ENCOUNTER — Encounter: Payer: Self-pay | Admitting: Family Medicine

## 2018-04-29 ENCOUNTER — Ambulatory Visit: Payer: 59 | Admitting: Family Medicine

## 2018-04-29 VITALS — BP 140/92 | HR 63 | Ht 73.0 in | Wt 208.0 lb

## 2018-04-29 DIAGNOSIS — S73191S Other sprain of right hip, sequela: Secondary | ICD-10-CM

## 2018-04-29 DIAGNOSIS — M999 Biomechanical lesion, unspecified: Secondary | ICD-10-CM

## 2018-04-29 DIAGNOSIS — M533 Sacrococcygeal disorders, not elsewhere classified: Secondary | ICD-10-CM | POA: Diagnosis not present

## 2018-04-29 MED ORDER — KETOROLAC TROMETHAMINE 60 MG/2ML IM SOLN
60.0000 mg | Freq: Once | INTRAMUSCULAR | Status: AC
  Administered 2018-04-29: 60 mg via INTRAMUSCULAR

## 2018-04-29 MED ORDER — METHYLPREDNISOLONE ACETATE 80 MG/ML IJ SUSP
80.0000 mg | Freq: Once | INTRAMUSCULAR | Status: AC
  Administered 2018-04-29: 80 mg via INTRAMUSCULAR

## 2018-04-29 NOTE — Assessment & Plan Note (Signed)
Continues to inpatient pain.  Discussed with patient again about icing regimen and home exercises.  I do believe that patient's labral tear of the have become functional and continues to cause discomfort.  Patient is going to consider surgical intervention in the near future.  We discussed other medications which patient declined.  Follow-up again for 8 weeks

## 2018-04-29 NOTE — Assessment & Plan Note (Signed)
Continues to have pain and seems to be favoring it.  Toradol and Depo-Medrol given today.  Worsening pain patient will likely need surgical intervention.

## 2018-04-29 NOTE — Patient Instructions (Signed)
Fd time for you  2 injections today  Ice is your friend See me again in 4 weeks

## 2018-04-29 NOTE — Assessment & Plan Note (Signed)
Decision today to treat with OMT was based on Physical Exam  After verbal consent patient was treated with HVLA, ME, FPR techniques in  thoracic, lumbar and sacral areas  Patient tolerated the procedure well with improvement in symptoms  Patient given exercises, stretches and lifestyle modifications  See medications in patient instructions if given  Patient will follow up in 4 weeks 

## 2018-05-27 ENCOUNTER — Other Ambulatory Visit: Payer: Self-pay

## 2018-05-27 ENCOUNTER — Ambulatory Visit (INDEPENDENT_AMBULATORY_CARE_PROVIDER_SITE_OTHER): Payer: 59 | Admitting: Family Medicine

## 2018-05-27 ENCOUNTER — Encounter: Payer: Self-pay | Admitting: Family Medicine

## 2018-05-27 VITALS — BP 114/84 | HR 67 | Wt 208.0 lb

## 2018-05-27 DIAGNOSIS — M533 Sacrococcygeal disorders, not elsewhere classified: Secondary | ICD-10-CM | POA: Diagnosis not present

## 2018-05-27 DIAGNOSIS — M999 Biomechanical lesion, unspecified: Secondary | ICD-10-CM | POA: Diagnosis not present

## 2018-05-27 NOTE — Assessment & Plan Note (Signed)
Still contributing to most of the discomfort and pain.  Discussed with patient about icing regimen and home exercise.  Discussed with patient about avoiding certain activities.  Follow-up again in 4 to 8 weeks.  Patient is awaiting surgery for his hip labral tear but will not do it yet

## 2018-05-27 NOTE — Patient Instructions (Signed)
God to see you  Have fun  See me again in 4-6 weeks

## 2018-05-27 NOTE — Progress Notes (Signed)
[name] Khadar [name] D.O. [facility] [address] [place], [place] [address] Phone: [phone] Subjective:   [name], Valerie Wolf, am serving as a scribe for Dr. Antoine PrimasZachary Gilberte Gorley.  I'm seeing this patient by the request  of:    CC: Hip and back pain follow-up  [id number]:Subjective  03/10/2018: Continues to inpatient pain.  Discussed with patient again about icing regimen and home exercises.  I do believe that patient's labral tear of the have become functional and continues to cause discomfort.  Patient is going to consider surgical intervention in the near future.  We discussed other medications which patient declined.  Follow-up again for 8 weeks  Update 05/27/2018: Tony [name] is a 38 y.o. male coming in with complaint of hip and back pain. Patient states that he continues to have intermittent hip pain. Is speaking to Dr. Aundria Rudogers at [facility] and is going to have surgery later this year for labral tear. Pain has been managed successfully with OMT. Cycling and hiking does increase his pain but nothing that he is not able to work through or manage at this time.      Past Medical History:  Diagnosis Date  . ALLERGIC RHINITIS 09/17/2009  . ASTHMA 09/17/2009  . HYPERLIPIDEMIA 09/17/2009   Past Surgical History:  Procedure Laterality Date  . NASAL SINUS SURGERY Right 12/04/2014   Dr. Jenne PaneBates  . SINUS EXPLORATION  Nov 2014    per Dr. Jenne PaneBates    Social History   Socioeconomic History  . Marital status: Married    Spouse name: Not on file  . Number of children: Not on file  . Years of education: Not on file  . Highest education level: Not on file  Occupational History  . Not on file  Social Needs  . Financial resource strain: Not on file  . Food insecurity:    Worry: Not on file    Inability: Not on file  . Transportation needs:    Medical: Not on file    Non-medical: Not on file  Tobacco Use  . Smoking status: Never Smoker  . Smokeless tobacco: Never Used  Substance and Sexual  Activity  . Alcohol use: Yes    Comment: occ  . Drug use: No  . Sexual activity: Not on file  Lifestyle  . Physical activity:    Days per week: Not on file    Minutes per session: Not on file  . Stress: Not on file  Relationships  . Social connections:    Talks on phone: Not on file    Gets together: Not on file    Attends religious service: Not on file    Active member of club or organization: Not on file    Attends meetings of clubs or organizations: Not on file    Relationship status: Not on file  Other Topics Concern  . Not on file  Social History Narrative   Married. 2 children. [name] (May 2016). [name] (July 2013).    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 Healthy Mother   . Sinusitis Father        recurrent- sinus surgery multiple. rare fungal infection 20 cases in world.   Marland Kitchen. CAD Father  age 1 first heart attack. never smoker  . Alcohol abuse Father   . Depression Father        abused pain pills in past  . Atrial fibrillation Father   . Kidney disease Father        lifestyle/treatment of sinus issues related  . Healthy Brother      Current Outpatient Medications (Cardiovascular):  Marland Kitchen  EPINEPHrine 0.3 mg/0.3 mL IJ SOAJ injection, as directed.  Current Outpatient Medications (Respiratory):  .  albuterol (PROVENTIL HFA;VENTOLIN HFA) 108 (90 Base) MCG/ACT inhaler, Take 2 puffs by mouth every 6 hours as needed .  ARNUITY ELLIPTA 100 MCG/ACT AEPB,  .  benzonatate (TESSALON) 200 MG capsule, Take 1 capsule (200 mg total) by mouth 2 (two) times daily as needed for cough. .  budesonide-formoterol (SYMBICORT) 160-4.5 MCG/ACT inhaler, Inhale 2 puffs into the lungs 2 (two) times daily. .  fexofenadine (ALLEGRA) 180 MG tablet, Take 180 mg by mouth daily. .  fluticasone (FLONASE) 50 MCG/ACT nasal spray,  2 sprays by Nasal route daily.   .  montelukast (SINGULAIR) 10 MG tablet, TAKE 1 TABLET IN THE EVENING ONCE A DAY .  Olopatadine HCl (PATANASE) 0.6 % SOLN, by Nasal route. 2 sprays to each nostril daily     Current Outpatient Medications (Other):  .  azithromycin (ZITHROMAX) 250 MG tablet, Take 2 tabs day 1, then 1 tab daily    Past medical history, social, surgical and family history all reviewed in electronic medical record.  No pertanent information unless stated regarding to the chief complaint.   Review of Systems:  No headache, visual changes, nausea, vomiting, diarrhea, constipation, dizziness, abdominal pain, skin rash, fevers, chills, night sweats, weight loss, swollen lymph nodes, body aches, joint swelling, muscle aches, chest pain, shortness of breath, mood changes.   Objective  Blood pressure 114/84, pulse 67, weight 208 lb (94.3 kg), SpO2 98 %.    General: No apparent distress alert and oriented x3 mood and affect normal, dressed appropriately.  HEENT: Pupils equal, extraocular movements intact  Respiratory: Patient's speak in full sentences and does not appear short of breath  Cardiovascular: No lower extremity edema, non tender, no erythema  Skin: Warm dry intact with no signs of infection or rash on extremities or on axial skeleton.  Abdomen: Soft nontender  Neuro: Cranial nerves II through XII are intact, neurovascularly intact in all extremities with 2+ DTRs and 2+ pulses.  Lymph: No lymphadenopathy of posterior or anterior cervical chain or axillae bilaterally.  Gait normal with good balance and coordination.  MSK:  Non tender with full range of motion and good stability and symmetric strength and tone of shoulders, elbows, wrist, , knee and ankles bilaterally.  Bilateral hip exam does show some decreasing range of motion.  Positive grind test on the right side.  Patient does have decreased internal range of motion laterally to.  Negative straight leg test  bilaterally.  Tender to palpation in the paraspinal musculature lumbar spine right greater than left.  Osteopathic findings   T6 extended rotated and side bent left L2 flexed rotated and side bent right Sacrum right on right     Impression and Recommendations:     This case required medical decision making of moderate complexity. The above documentation has been reviewed and is accurate and complete Judi Saa, DO       Note: This dictation was prepared with Dragon dictation along with smaller phrase technology. Any transcriptional errors that result from this process  are unintentional.

## 2018-05-27 NOTE — Assessment & Plan Note (Signed)
Decision today to treat with OMT was based on Physical Exam  After verbal consent patient was treated with HVLA, ME, FPR techniques in  thoracic, lumbar and sacral areas  Patient tolerated the procedure well with improvement in symptoms  Patient given exercises, stretches and lifestyle modifications  See medications in patient instructions if given  Patient will follow up in 4-6 weeks 

## 2018-06-22 NOTE — Progress Notes (Signed)
[name]  D.O. [facility] [address] [place], [place] [address] Phone: [phone] Subjective:   I Tony NighKana [name] am serving as a Neurosurgeonscribe for Dr. Antoine PrimasZachary .   CC: Back and hip pain follow-up  NUU:[id number]:Subjective  Creed Coppericholas Lund is a 38 y.o. male coming in with complaint of back pain. States that he is doing well. Monday he felt something catch on the left side of his back. Tingling also ran down his leg and foot all day long.  Patient states that the hip is been hurting a little bit more as well.  Describes the pain as a dull, throbbing aching sensation.  Nothing severe as what he has had previously.  Has a labral tear and is still considering surgery.      Past Medical History:  Diagnosis Date  . ALLERGIC RHINITIS 09/17/2009  . ASTHMA 09/17/2009  . HYPERLIPIDEMIA 09/17/2009   Past Surgical History:  Procedure Laterality Date  . NASAL SINUS SURGERY Right 12/04/2014   Dr. Jenne PaneBates  . SINUS EXPLORATION  Nov 2014    per Dr. Jenne PaneBates    Social History   Socioeconomic History  . Marital status: Married    Spouse name: Not on file  . Number of children: Not on file  . Years of education: Not on file  . Highest education level: Not on file  Occupational History  . Not on file  Social Needs  . Financial resource strain: Not on file  . Food insecurity    Worry: Not on file    Inability: Not on file  . Transportation needs    Medical: Not on file    Non-medical: Not on file  Tobacco Use  . Smoking status: Never Smoker  . Smokeless tobacco: Never Used  Substance and Sexual Activity  . Alcohol use: Yes    Comment: occ  . Drug use: No  . Sexual activity: Not on file  Lifestyle  . Physical activity    Days per week: Not on file    Minutes per session: Not on file  . Stress: Not on file  Relationships  . Social Musicianconnections    Talks on phone: Not on file    Gets together: Not on file    Attends religious service: Not on file    Active member of club or  organization: Not on file    Attends meetings of clubs or organizations: Not on file    Relationship status: Not on file  Other Topics Concern  . Not on file  Social History Narrative   Married. 2 children. [name] (May 2016). [name] (July 2013).    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 Healthy Mother   . Sinusitis Father        recurrent- sinus surgery multiple. rare fungal infection 20 cases in world.   Marland Kitchen. CAD Father        age 845 first heart attack. never smoker  . Alcohol abuse Father   . Depression Father        abused pain pills in past  . Atrial fibrillation Father   . Kidney disease Father        lifestyle/treatment of sinus issues related  . Healthy Brother      Current  Outpatient Medications (Cardiovascular):  Marland Kitchen  EPINEPHrine 0.3 mg/0.3 mL IJ SOAJ injection, as directed.  Current Outpatient Medications (Respiratory):  .  albuterol (PROVENTIL HFA;VENTOLIN HFA) 108 (90 Base) MCG/ACT inhaler, Take 2 puffs by mouth every 6 hours as needed .  ARNUITY ELLIPTA 100 MCG/ACT AEPB,  .  benzonatate (TESSALON) 200 MG capsule, Take 1 capsule (200 mg total) by mouth 2 (two) times daily as needed for cough. .  budesonide-formoterol (SYMBICORT) 160-4.5 MCG/ACT inhaler, Inhale 2 puffs into the lungs 2 (two) times daily. .  fexofenadine (ALLEGRA) 180 MG tablet, Take 180 mg by mouth daily. .  fluticasone (FLONASE) 50 MCG/ACT nasal spray, 2 sprays by Nasal route daily.   .  montelukast (SINGULAIR) 10 MG tablet, TAKE 1 TABLET IN THE EVENING ONCE A DAY .  Olopatadine HCl (PATANASE) 0.6 % SOLN, by Nasal route. 2 sprays to each nostril daily     Current Outpatient Medications (Other):  .  azithromycin (ZITHROMAX) 250 MG tablet, Take 2 tabs day 1, then 1 tab daily    Past medical history, social, surgical and family history  all reviewed in electronic medical record.  No pertanent information unless stated regarding to the chief complaint.   Review of Systems:  No headache, visual changes, nausea, vomiting, diarrhea, constipation, dizziness, abdominal pain, skin rash, fevers, chills, night sweats, weight loss, swollen lymph nodes, body aches, joint swelling, chest pain, shortness of breath, mood changes.  Positive muscle aches  Objective  Blood pressure 110/80, pulse 68, height 6\' 1"  (1.854 m), weight 209 lb (94.8 kg), SpO2 98 %.    General: No apparent distress alert and oriented x3 mood and affect normal, dressed appropriately.  HEENT: Pupils equal, extraocular movements intact  Respiratory: Patient's speak in full sentences and does not appear short of breath  Cardiovascular: No lower extremity edema, non tender, no erythema  Skin: Warm dry intact with no signs of infection or rash on extremities or on axial skeleton.  Abdomen: Soft nontender  Neuro: Cranial nerves II through XII are intact, neurovascularly intact in all extremities with 2+ DTRs and 2+ pulses.  Lymph: No lymphadenopathy of posterior or anterior cervical chain or axillae bilaterally.  Gait normal with good balance and coordination.  MSK:  Non tender with full range of motion and good stability and symmetric strength and tone of shoulders, elbows, wrist, hip, knee and ankles bilaterally.  Back Exam:  Inspection: Mild loss of lordosis Motion: Flexion 35 deg, Extension 25 deg, Side Bending to 45 deg bilaterally,  Rotation to 45 deg bilaterally  SLR laying: Negative  XSLR laying: Negative  Palpable tenderness: Tender to palpation the paraspinal musculature lumbar spine right greater than left. FABER: Tightness bilaterally. Sensory change: Gross sensation intact to all lumbar and sacral dermatomes.  Reflexes: 2+ at both patellar tendons, 2+ at achilles tendons, Babinski's downgoing.  Strength at foot  Plantar-flexion: 5/5 Dorsi-flexion: 5/5  Eversion: 5/5 Inversion: 5/5  Leg strength  Quad: 5/5 Hamstring: 5/5 Hip flexor: 5/5 Hip abductors: 5/5   Osteopathic findings T4 extended rotated and side bent left L2 flexed rotated and side bent right Sacrum right on right    Impression and Recommendations:     This case required medical decision making of moderate complexity. The above documentation has been reviewed and is accurate and complete Lyndal Pulley, DO       Note: This dictation was prepared with Dragon dictation along with smaller phrase technology. Any transcriptional errors that result from this process are unintentional.

## 2018-06-23 ENCOUNTER — Ambulatory Visit: Payer: 59 | Admitting: Family Medicine

## 2018-06-23 ENCOUNTER — Other Ambulatory Visit: Payer: Self-pay

## 2018-06-23 ENCOUNTER — Encounter: Payer: Self-pay | Admitting: Family Medicine

## 2018-06-23 VITALS — BP 110/80 | HR 68 | Ht 73.0 in | Wt 209.0 lb

## 2018-06-23 DIAGNOSIS — M999 Biomechanical lesion, unspecified: Secondary | ICD-10-CM

## 2018-06-23 DIAGNOSIS — M533 Sacrococcygeal disorders, not elsewhere classified: Secondary | ICD-10-CM

## 2018-06-23 NOTE — Assessment & Plan Note (Signed)
Continues to have sacroiliac dysfunction.  Discussed again about the hip abductor.  Discussed core strengthening.  Patient has not been able to workout secondary to the COVID outbreak and all gym is been close.  Discussed with activities to do which wants to avoid.  Follow-up again in 4 to 8 weeks

## 2018-06-23 NOTE — Assessment & Plan Note (Signed)
Decision today to treat with OMT was based on Physical Exam  After verbal consent patient was treated with HVLA, ME, FPR techniques in  thoracic, lumbar and sacral areas  Patient tolerated the procedure well with improvement in symptoms  Patient given exercises, stretches and lifestyle modifications  See medications in patient instructions if given  Patient will follow up in 4-8 weeks 

## 2018-07-22 ENCOUNTER — Encounter: Payer: Self-pay | Admitting: Family Medicine

## 2018-07-22 ENCOUNTER — Other Ambulatory Visit: Payer: Self-pay

## 2018-07-22 ENCOUNTER — Ambulatory Visit (INDEPENDENT_AMBULATORY_CARE_PROVIDER_SITE_OTHER): Payer: 59 | Admitting: Family Medicine

## 2018-07-22 VITALS — BP 110/70 | HR 88 | Ht 73.0 in | Wt 207.0 lb

## 2018-07-22 DIAGNOSIS — M533 Sacrococcygeal disorders, not elsewhere classified: Secondary | ICD-10-CM

## 2018-07-22 DIAGNOSIS — M999 Biomechanical lesion, unspecified: Secondary | ICD-10-CM

## 2018-07-22 NOTE — Progress Notes (Signed)
[name] Smith D.O. [facility] [address] [place], [place] [address] Phone: [phone] Subjective:   I Tony NighKana Thompson am serving as a Neurosurgeonscribe for Dr. Antoine PrimasZachary Smith.   CC: Low back and hip pain  BJY:[id number]:Subjective  Tony [name] is a 38 y.o. male coming in with complaint of back pain. States that he is feeling better than he did his previous visit. Still debating the patient to be a labral repair.  He cannot run secondary to the discomfort and pain.  Denies radiation down the legs or any numbness.    Past Medical History:  Diagnosis Date  . ALLERGIC RHINITIS 09/17/2009  . ASTHMA 09/17/2009  . HYPERLIPIDEMIA 09/17/2009   Past Surgical History:  Procedure Laterality Date  . NASAL SINUS SURGERY Right 12/04/2014   Dr. Jenne PaneBates  . SINUS EXPLORATION  Nov 2014    per Dr. Jenne PaneBates    Social History   Socioeconomic History  . Marital status: Married    Spouse name: Not on file  . Number of children: Not on file  . Years of education: Not on file  . Highest education level: Not on file  Occupational History  . Not on file  Social Needs  . Financial resource strain: Not on file  . Food insecurity    Worry: Not on file    Inability: Not on file  . Transportation needs    Medical: Not on file    Non-medical: Not on file  Tobacco Use  . Smoking status: Never Smoker  . Smokeless tobacco: Never Used  Substance and Sexual Activity  . Alcohol use: Yes    Comment: occ  . Drug use: No  . Sexual activity: Not on file  Lifestyle  . Physical activity    Days per week: Not on file    Minutes per session: Not on file  . Stress: Not on file  Relationships  . Social Musicianconnections    Talks on phone: Not on file    Gets together: Not on file    Attends religious service: Not on file    Active member of club or organization: Not on file    Attends meetings of clubs or organizations: Not on file    Relationship status: Not on file  Other Topics Concern  . Not on file  Social  History Narrative   Married. 2 children. Tony [name] (May 2016). Tony [name] (July 2013).    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 Healthy Mother   . Sinusitis Father        recurrent- sinus surgery multiple. rare fungal infection 20 cases in world.   Marland Kitchen. CAD Father        age 36 first heart attack. never smoker  . Alcohol abuse Father   . Depression Father        abused pain pills in past  . Atrial fibrillation Father   . Kidney disease Father        lifestyle/treatment of sinus issues related  . Healthy Brother      Current Outpatient Medications (Cardiovascular):  Marland Kitchen.  EPINEPHrine 0.3 mg/0.3 mL IJ SOAJ injection, as directed.  Current Outpatient Medications (Respiratory):  .  albuterol (PROVENTIL HFA;VENTOLIN HFA) 108 (90 Base) MCG/ACT inhaler, Take 2 puffs  by mouth every 6 hours as needed .  ARNUITY ELLIPTA 100 MCG/ACT AEPB,  .  benzonatate (TESSALON) 200 MG capsule, Take 1 capsule (200 mg total) by mouth 2 (two) times daily as needed for cough. .  budesonide-formoterol (SYMBICORT) 160-4.5 MCG/ACT inhaler, Inhale 2 puffs into the lungs 2 (two) times daily. .  fexofenadine (ALLEGRA) 180 MG tablet, Take 180 mg by mouth daily. .  fluticasone (FLONASE) 50 MCG/ACT nasal spray, 2 sprays by Nasal route daily.   .  montelukast (SINGULAIR) 10 MG tablet, TAKE 1 TABLET IN THE EVENING ONCE A DAY .  Olopatadine HCl (PATANASE) 0.6 % SOLN, by Nasal route. 2 sprays to each nostril daily     Current Outpatient Medications (Other):  .  azithromycin (ZITHROMAX) 250 MG tablet, Take 2 tabs day 1, then 1 tab daily    Past medical history, social, surgical and family history all reviewed in electronic medical record.  No pertanent information unless stated regarding to the chief complaint.   Review of Systems:  No headache, visual changes,  nausea, vomiting, diarrhea, constipation, dizziness, abdominal pain, skin rash, fevers, chills, night sweats, weight loss, swollen lymph nodes, body aches, joint swelling, , chest pain, shortness of breath, mood changes.  Positive muscle aches  Objective  Blood pressure 110/70, pulse 88, height 6\' 1"  (1.854 m), weight 207 lb (93.9 kg), SpO2 98 %.   General: No apparent distress alert and oriented x3 mood and affect normal, dressed appropriately.  HEENT: Pupils equal, extraocular movements intact  Respiratory: Patient's speak in full sentences and does not appear short of breath  Cardiovascular: No lower extremity edema, non tender, no erythema  Skin: Warm dry intact with no signs of infection or rash on extremities or on axial skeleton.  Abdomen: Soft nontender  Neuro: Cranial nerves II through XII are intact, neurovascularly intact in all extremities with 2+ DTRs and 2+ pulses.  Lymph: No lymphadenopathy of posterior or anterior cervical chain or axillae bilaterally.  Gait normal with good balance and coordination.  MSK:  Non tender with full range of motion and good stability and symmetric strength and tone of shoulders, elbows, wrist, hip, knee and ankles bilaterally.  Back Exam:  Inspection: Unremarkable  Motion: Flexion 45 deg, Extension 25 deg, Side Bending to 45 deg bilaterally,  Rotation to 45 deg bilaterally  SLR laying: Negative  XSLR laying: Negative  Palpable tenderness: Positive muscle aches. FABER: Tightness bilaterally. Sensory change: Gross sensation intact to all lumbar and sacral dermatomes.  Reflexes: 2+ at both patellar tendons, 2+ at achilles tendons, Babinski's downgoing.  Strength at foot  Plantar-flexion: 5/5 Dorsi-flexion: 5/5 Eversion: 5/5 Inversion: 5/5  Leg strength  Quad: 5/5 Hamstring: 5/5 Hip flexor: 5/5 Hip abductors: 5/5  Gait unremarkable.  Osteopathic findings  T7 extended rotated and side bent right inhaled rib L2 flexed rotated and side bent  right Sacrum right on right    Impression and Recommendations:     This case required medical decision making of moderate complexity. The above documentation has been reviewed and is accurate and complete Lyndal Pulley, DO       Note: This dictation was prepared with Dragon dictation along with smaller phrase technology. Any transcriptional errors that result from this process are unintentional.

## 2018-07-22 NOTE — Assessment & Plan Note (Signed)
Decision today to treat with OMT was based on Physical Exam  After verbal consent patient was treated with HVLA, ME, FPR techniques in  thoracic, lumbar and sacral areas  Patient tolerated the procedure well with improvement in symptoms  Patient given exercises, stretches and lifestyle modifications  See medications in patient instructions if given  Patient will follow up in 4-6 weeks 

## 2018-07-22 NOTE — Assessment & Plan Note (Signed)
Continues to have difficulty with patient having spine impingement secondary to labral tear in the hip.  Discussed posture and ergonomics.  Discussed which activities to do which wants to avoid.  Patient still seems to be doing relatively well.  Continues to put off the surgery for the hip.  Follow-up again in 4 to 6 weeks

## 2018-08-26 ENCOUNTER — Ambulatory Visit: Payer: 59 | Admitting: Family Medicine

## 2018-09-03 ENCOUNTER — Ambulatory Visit: Payer: 59 | Admitting: Family Medicine

## 2018-09-08 ENCOUNTER — Ambulatory Visit: Payer: 59 | Admitting: Family Medicine

## 2018-09-08 ENCOUNTER — Other Ambulatory Visit: Payer: Self-pay

## 2018-09-08 ENCOUNTER — Encounter: Payer: Self-pay | Admitting: Family Medicine

## 2018-09-08 VITALS — BP 110/74 | HR 71 | Ht 73.0 in | Wt 207.0 lb

## 2018-09-08 DIAGNOSIS — M533 Sacrococcygeal disorders, not elsewhere classified: Secondary | ICD-10-CM

## 2018-09-08 DIAGNOSIS — M999 Biomechanical lesion, unspecified: Secondary | ICD-10-CM

## 2018-09-08 NOTE — Assessment & Plan Note (Signed)
Tender to palpation.  Discussed with home exercises and core strengthening.  We discussed which activities to do which wants to avoid.  Discussed posture and ergonomics.  Follow-up again in 4 to 8 weeks patient will be undergoing the labral repair of his hip soon.

## 2018-09-08 NOTE — Progress Notes (Signed)
Tony [name] D.O. [facility] [address] [place], [place] [address] Phone: [phone] Subjective:   [name] am serving as a Neurosurgeonscribe for Dr. Antoine PrimasZachary Karcyn [name].  I'm seeing this patient by the request  of:    CC: back and hip pain   UXL:[id number]:Subjective  Tony [name] is a 38 y.o. male coming in with complaint of back pain. States his back is sore but not terrible.  Patient is also going to go undergo a labral repair on the right side.  Patient has had that for quite some time.  Rates the severity of pain is now 8 out of 10.  Affecting daily activities.     Past Medical History:  Diagnosis Date  . ALLERGIC RHINITIS 09/17/2009  . ASTHMA 09/17/2009  . HYPERLIPIDEMIA 09/17/2009   Past Surgical History:  Procedure Laterality Date  . NASAL SINUS SURGERY Right 12/04/2014   Dr. Jenne PaneBates  . SINUS EXPLORATION  Nov 2014    per Dr. Jenne PaneBates    Social History   Socioeconomic History  . Marital status: Married    Spouse name: Not on file  . Number of children: Not on file  . Years of education: Not on file  . Highest education level: Not on file  Occupational History  . Not on file  Social Needs  . Financial resource strain: Not on file  . Food insecurity    Worry: Not on file    Inability: Not on file  . Transportation needs    Medical: Not on file    Non-medical: Not on file  Tobacco Use  . Smoking status: Never Smoker  . Smokeless tobacco: Never Used  Substance and Sexual Activity  . Alcohol use: Yes    Comment: occ  . Drug use: No  . Sexual activity: Not on file  Lifestyle  . Physical activity    Days per week: Not on file    Minutes per session: Not on file  . Stress: Not on file  Relationships  . Social Musicianconnections    Talks on phone: Not on file    Gets together: Not on file    Attends religious service: Not on file    Active member of club or organization: Not on file    Attends meetings of clubs or organizations: Not on file    Relationship status:  Not on file  Other Topics Concern  . Not on file  Social History Narrative   Married. 2 children. [name] (May 2016). [name] (July 2013).    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 Healthy Mother   . Sinusitis Father        recurrent- sinus surgery multiple. rare fungal infection 20 cases in world.   Marland Kitchen. CAD Father        age 345 first heart attack. never smoker  . Alcohol abuse Father   . Depression Father        abused pain pills in past  . Atrial fibrillation Father   . Kidney disease Father        lifestyle/treatment of sinus issues related  . Healthy Brother      Current Outpatient Medications (Cardiovascular):  Marland Kitchen.  EPINEPHrine 0.3 mg/0.3 mL IJ SOAJ injection, as directed.  Current Outpatient Medications (Respiratory):  .  albuterol (PROVENTIL HFA;VENTOLIN HFA) 108 (90 Base) MCG/ACT inhaler, Take 2 puffs by mouth every 6 hours as needed .  ARNUITY ELLIPTA 100 MCG/ACT AEPB,  .  benzonatate (TESSALON) 200 MG capsule, Take 1 capsule (200 mg total) by mouth 2 (two) times daily as needed for cough. .  budesonide-formoterol (SYMBICORT) 160-4.5 MCG/ACT inhaler, Inhale 2 puffs into the lungs 2 (two) times daily. .  fexofenadine (ALLEGRA) 180 MG tablet, Take 180 mg by mouth daily. .  fluticasone (FLONASE) 50 MCG/ACT nasal spray, 2 sprays by Nasal route daily.   .  montelukast (SINGULAIR) 10 MG tablet, TAKE 1 TABLET IN THE EVENING ONCE A DAY .  Olopatadine HCl (PATANASE) 0.6 % SOLN, by Nasal route. 2 sprays to each nostril daily     Current Outpatient Medications (Other):  .  azithromycin (ZITHROMAX) 250 MG tablet, Take 2 tabs day 1, then 1 tab daily    Past medical history, social, surgical and family history all reviewed in electronic medical record.  No pertanent information unless stated regarding to the chief  complaint.   Review of Systems:  No headache, visual changes, nausea, vomiting, diarrhea, constipation, dizziness, abdominal pain, skin rash, fevers, chills, night sweats, weight loss, swollen lymph nodes, body aches, joint swelling,  chest pain, shortness of breath, mood changes.  Positive muscle aches  Objective  Blood pressure 110/74, pulse 71, height 6\' 1"  (1.854 m), weight 207 lb (93.9 kg), SpO2 97 %.    General: No apparent distress alert and oriented x3 mood and affect normal, dressed appropriately.  HEENT: Pupils equal, extraocular movements intact  Respiratory: Patient's speak in full sentences and does not appear short of breath  Cardiovascular: No lower extremity edema, non tender, no erythema  Skin: Warm dry intact with no signs of infection or rash on extremities or on axial skeleton.  Abdomen: Soft nontender  Neuro: Cranial nerves II through XII are intact, neurovascularly intact in all extremities with 2+ DTRs and 2+ pulses.  Lymph: No lymphadenopathy of posterior or anterior cervical chain or axillae bilaterally.  Gait normal with good balance and coordination.  MSK:  Non tender with full range of motion and good stability and symmetric strength and tone of shoulders, elbows, wrist,  knee and ankles bilaterally.  Back Exam:  Inspection: Unremarkable  Motion: Flexion 45 deg, Extension 25 deg, Side Bending to 45 deg bilaterally,  Rotation to 45 deg bilaterally  SLR laying: Negative  XSLR laying: Negative  Palpable tenderness: Tender to palpation in paraspinal musculature lumbar spine left greater than right. FABER: negative. Sensory change: Gross sensation intact to all lumbar and sacral dermatomes.  Reflexes: 2+ at both patellar tendons, 2+ at achilles tendons, Babinski's downgoing.  Strength at foot  Plantar-flexion: 5/5 Dorsi-flexion: 5/5 Eversion: 5/5 Inversion: 5/5  Leg strength  Quad: 5/5 Hamstring: 5/5 Hip flexor: 5/5 Hip abductors: 5/5  Gait unremarkable.    Osteopathic findings   T3 extended rotated and side bent right inhaled third rib T7 extended rotated and side bent left L2 flexed rotated and side bent right Sacrum left on left     Impression and Recommendations:     This case required medical decision making of moderate complexity. The above documentation has been reviewed and is accurate and complete Lyndal Pulley, DO       Note: This dictation was prepared with Dragon dictation along with smaller phrase technology. Any transcriptional errors that result from this process are unintentional.

## 2018-09-08 NOTE — Assessment & Plan Note (Signed)
Decision today to treat with OMT was based on Physical Exam  After verbal consent patient was treated with HVLA, ME, FPR techniques in  thoracic, lumbar and sacral areas  Patient tolerated the procedure well with improvement in symptoms  Patient given exercises, stretches and lifestyle modifications  See medications in patient instructions if given  Patient will follow up in 4-8 weeks 

## 2018-11-04 ENCOUNTER — Encounter: Payer: Self-pay | Admitting: Family Medicine

## 2019-05-31 IMAGING — DX DG LUMBAR SPINE 2-3V
3 series · 3 of 3 positions shown · non-contrast
Comparison: No recent prior.

CLINICAL DATA: Chronic back pain.  No known injury.

EXAM:
LUMBAR SPINE - 2-3 VIEW

[l-spine ap]
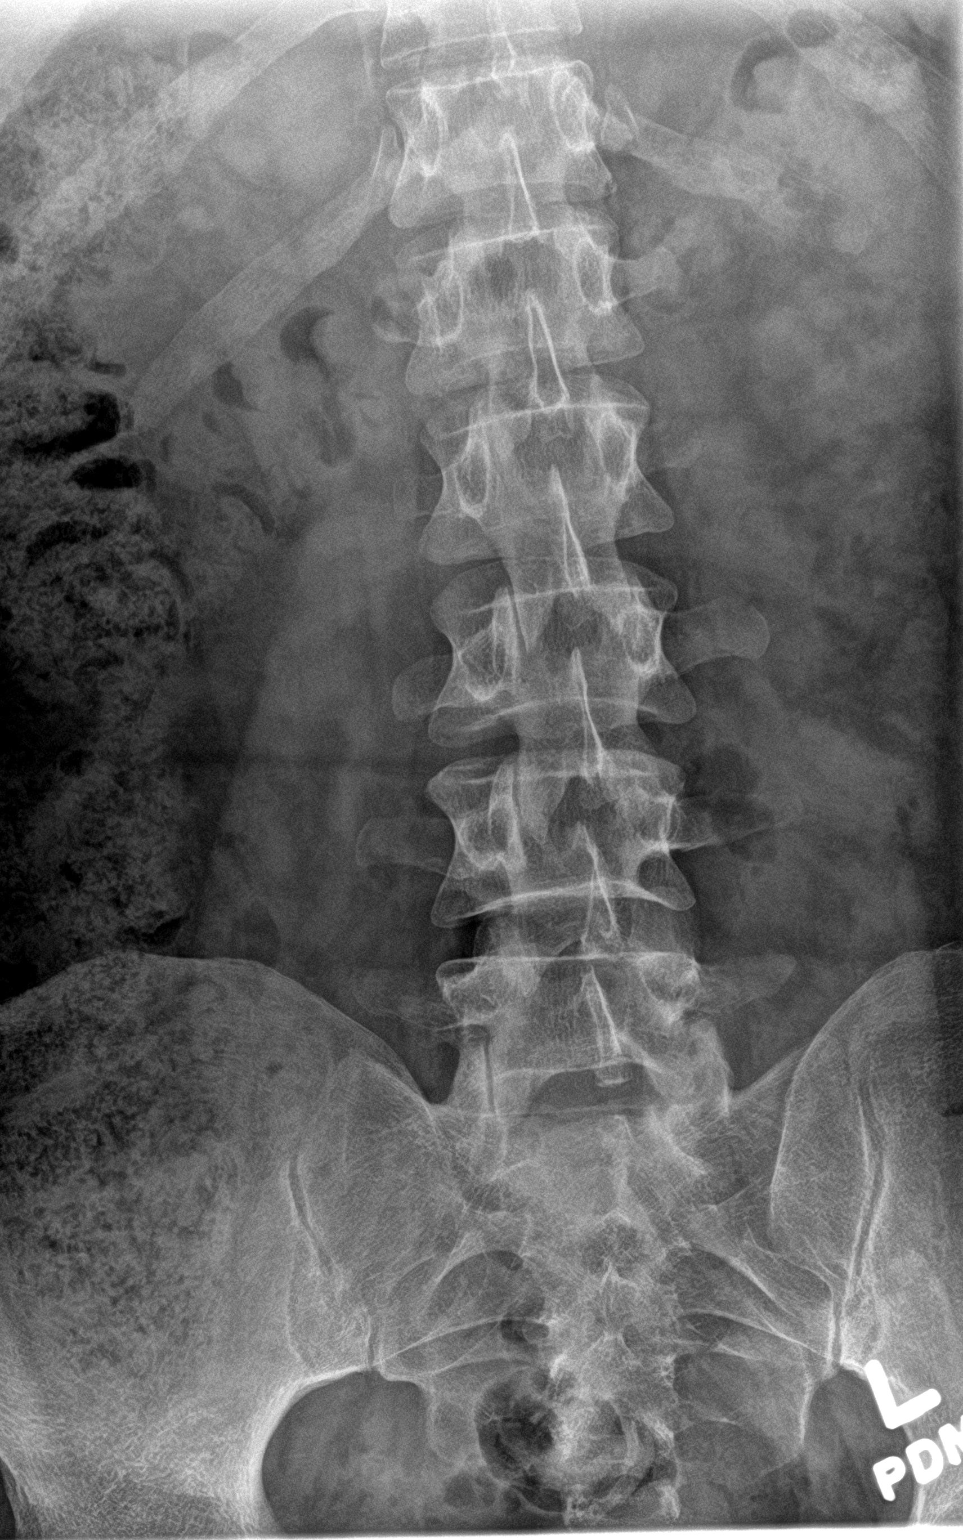

[l-spine lat]
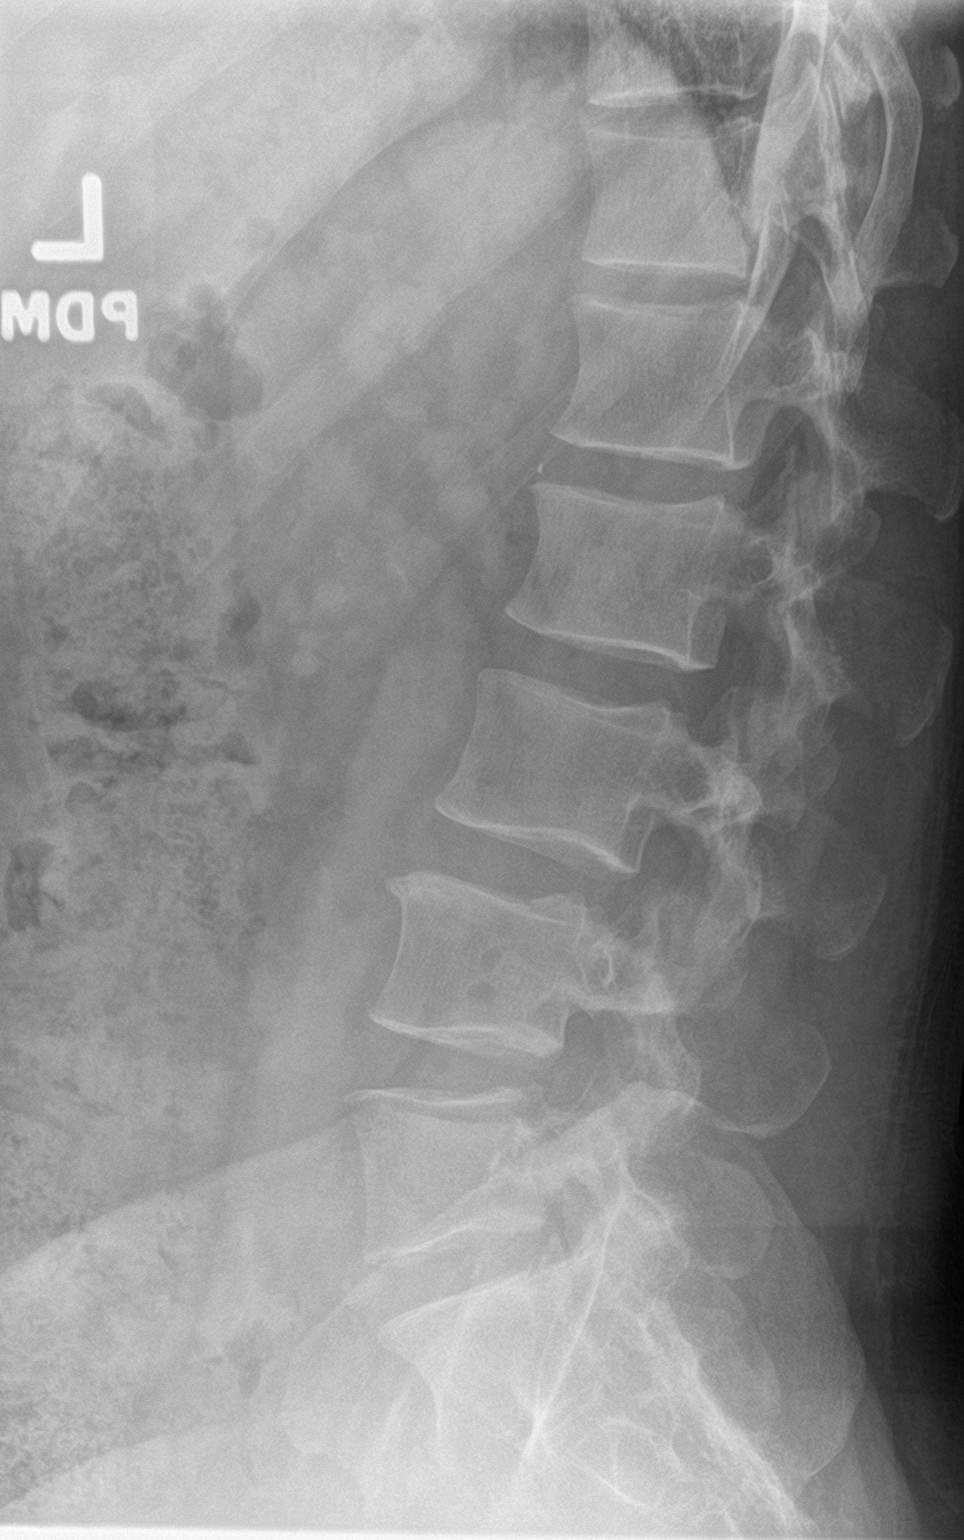

[l-spine spot]
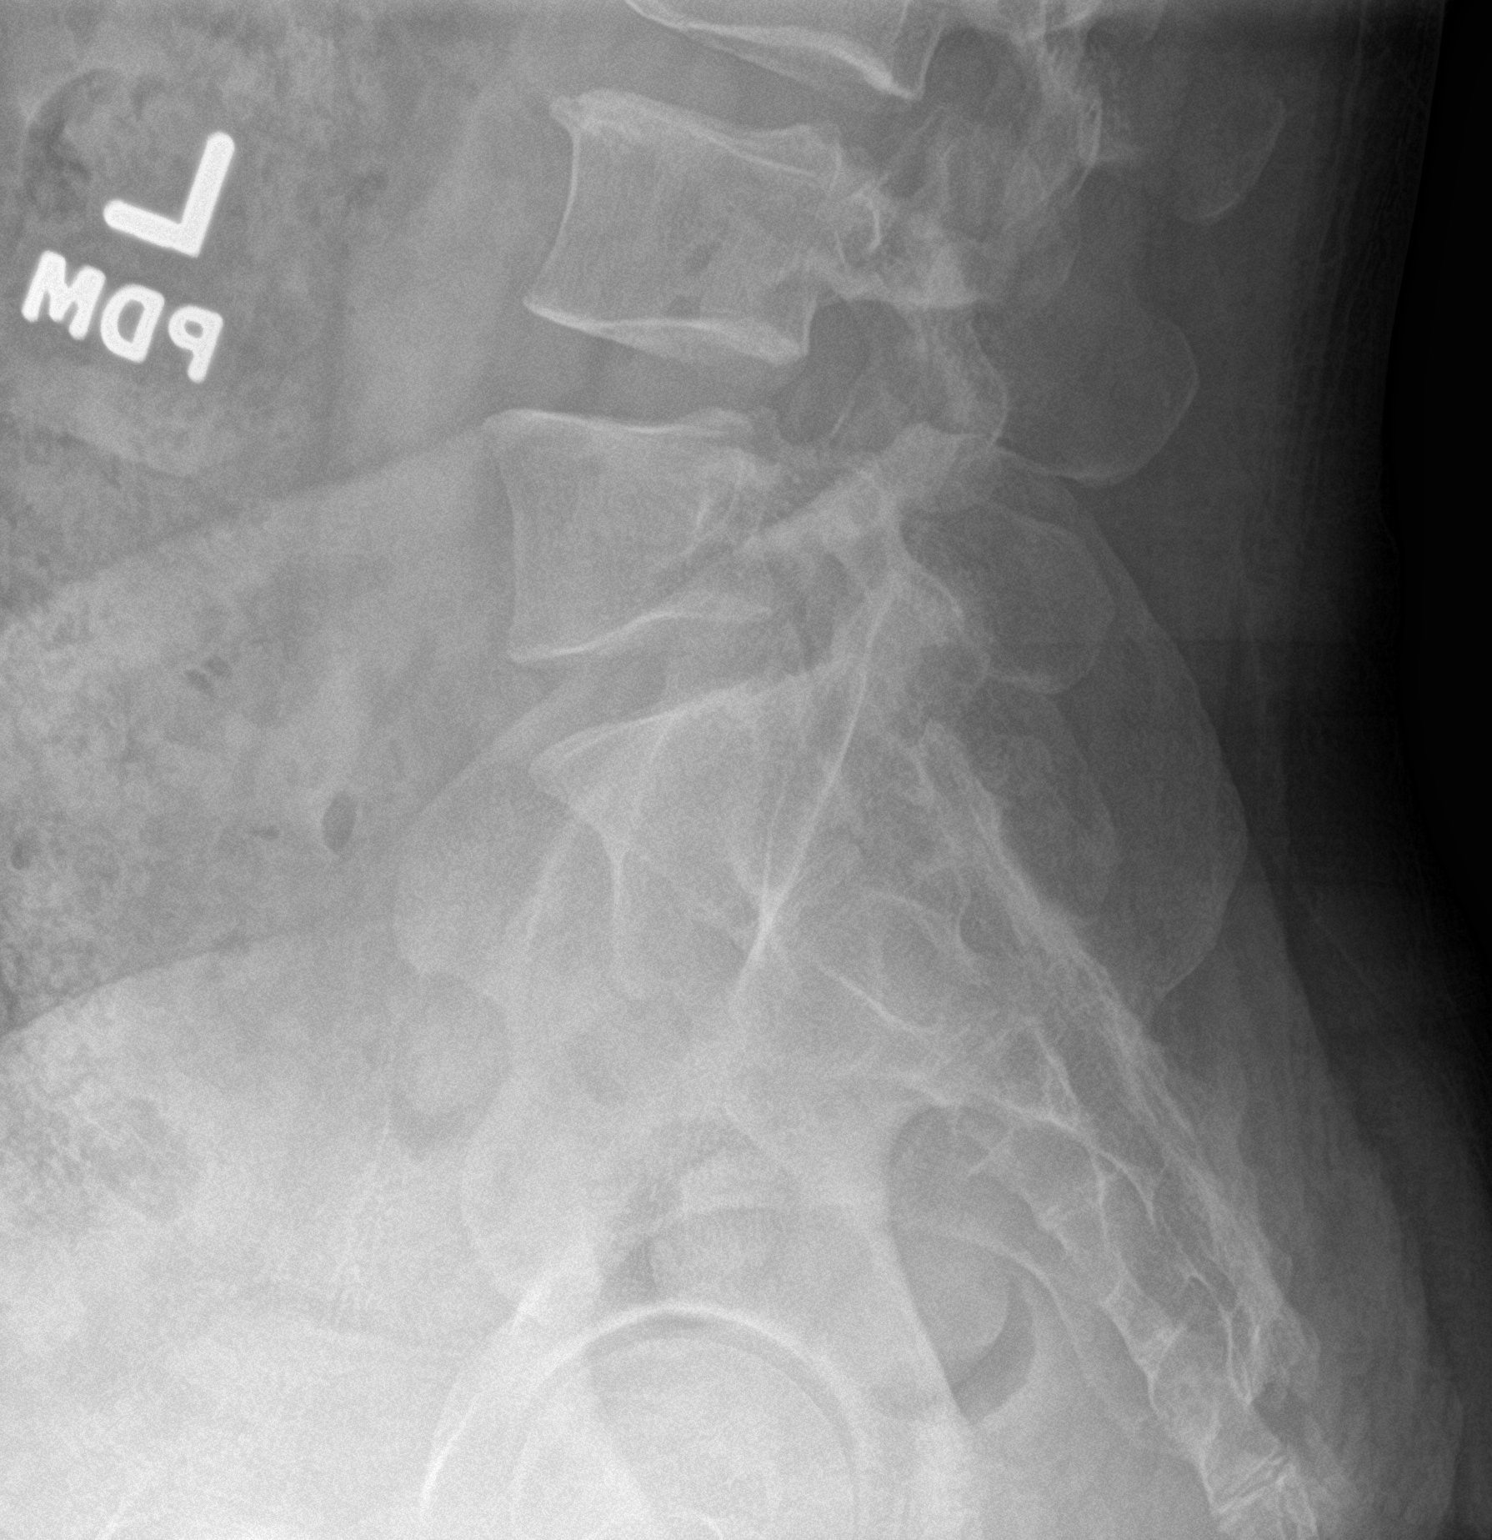

[3 of 3 positions shown; findings below may reference images not displayed]

FINDINGS: Diffuse multilevel degenerative change. No acute bony abnormality.
No evidence of fracture. Paraspinal soft tissues are normal.
IMPRESSION: Diffuse multilevel degenerative change.  No acute abnormality.

## 2019-07-13 IMAGING — US US PELVIS LIMITED
1 series · 14 of 17 positions shown · non-contrast
Comparison: None

CLINICAL DATA: Right anterolateral inguinal pain

EXAM:
LIMITED ULTRASOUND OF PELVIS
TECHNIQUE: Limited transabdominal ultrasound examination of the pelvis was
performed.

[Series 1: us pelvis limited · 0.09mm/px · 17 acquisitions, 14 frames shown]
[im 1/17]
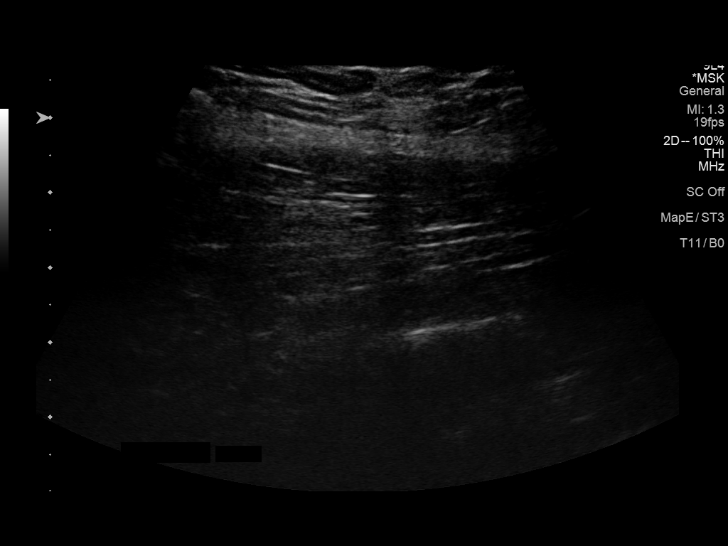
[im 2/17]
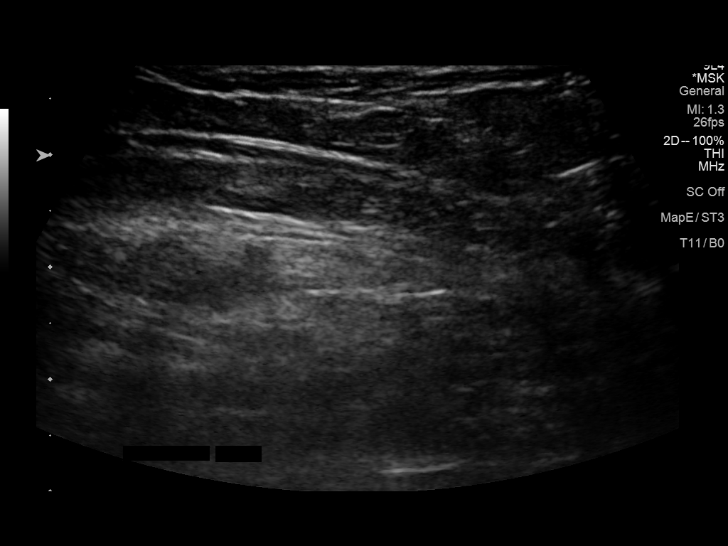
[im 4/17]
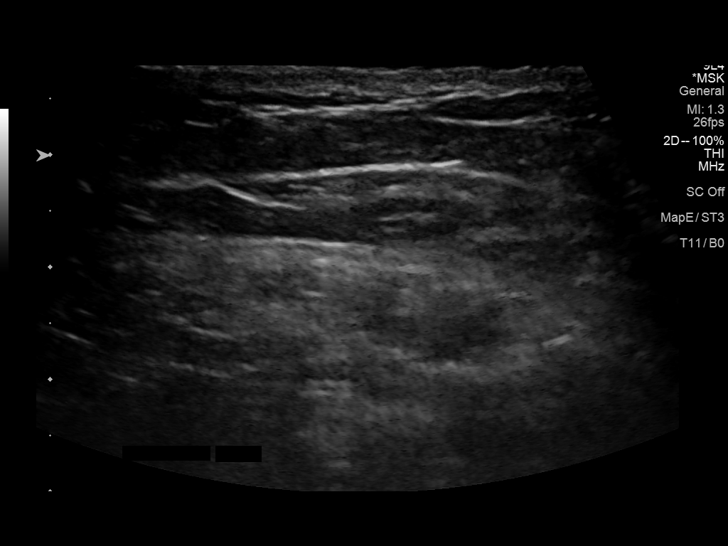
[im 5/17]
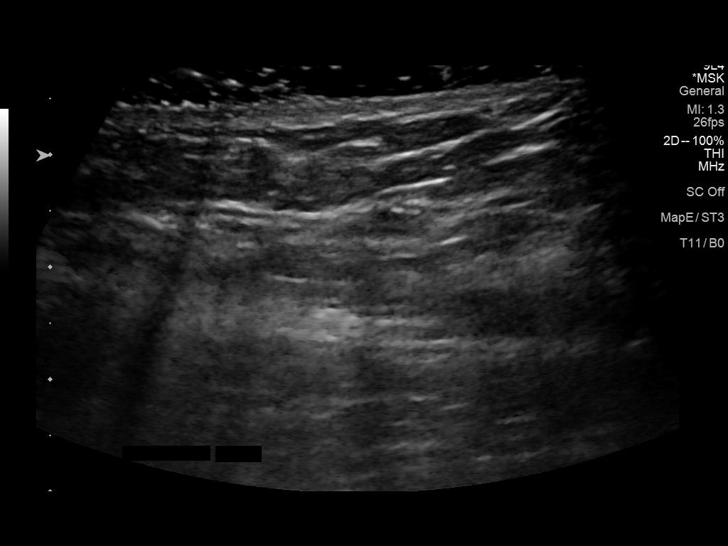
[im 6/17]
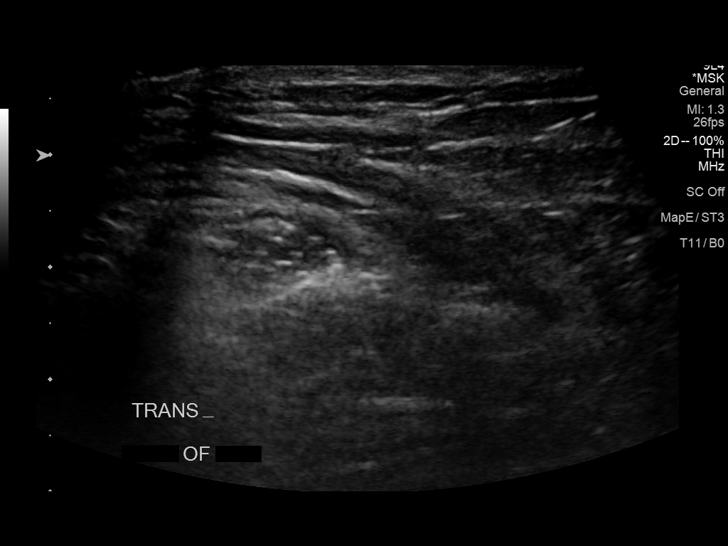
[im 7/17]
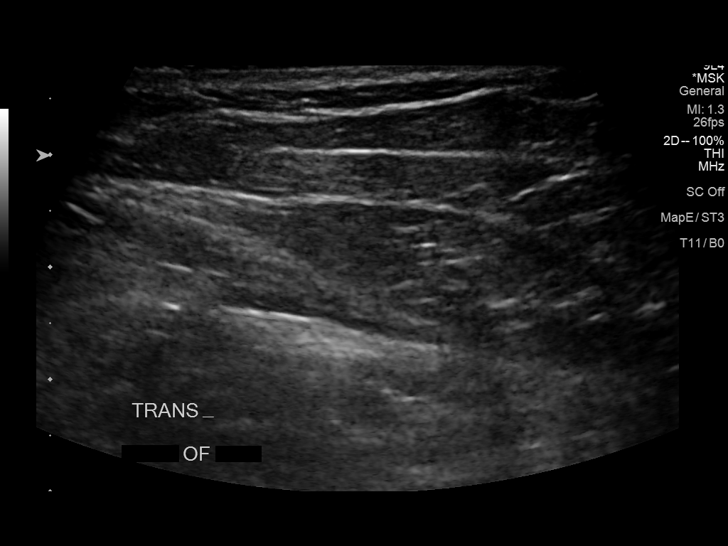
[im 8/17]
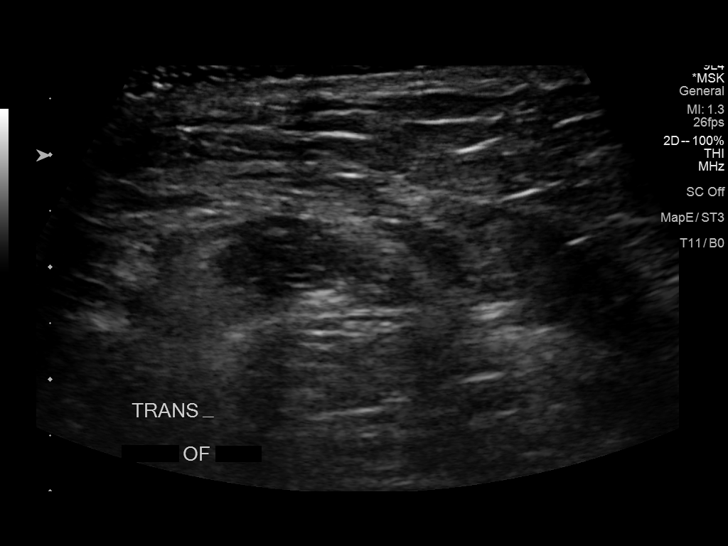
[im 10/17]
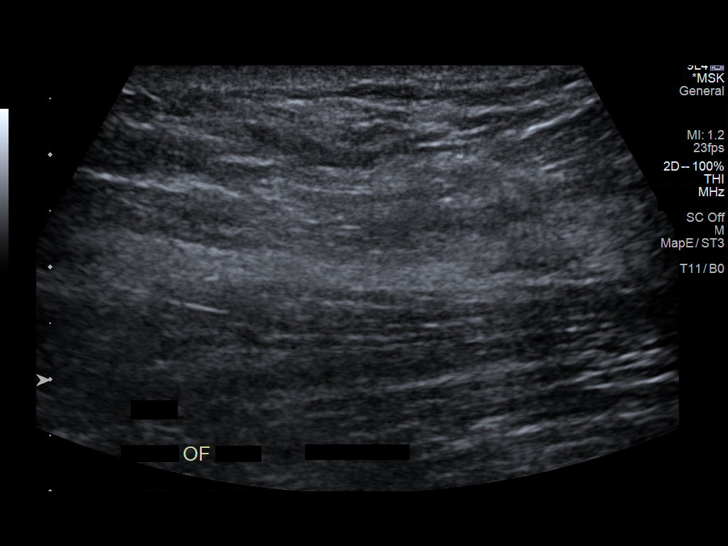
[im 11/17]
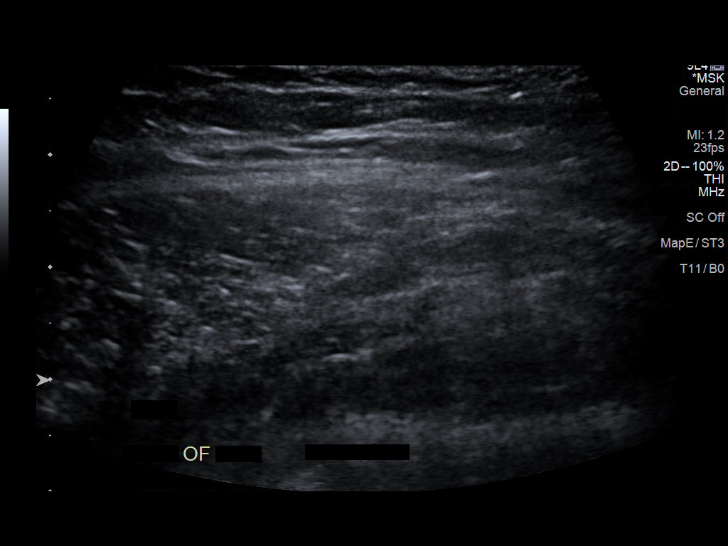
[im 12/17]
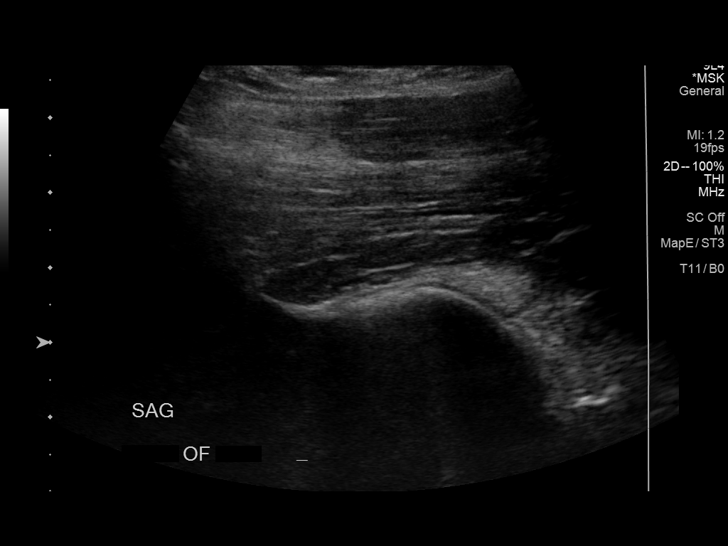
[im 13/17]
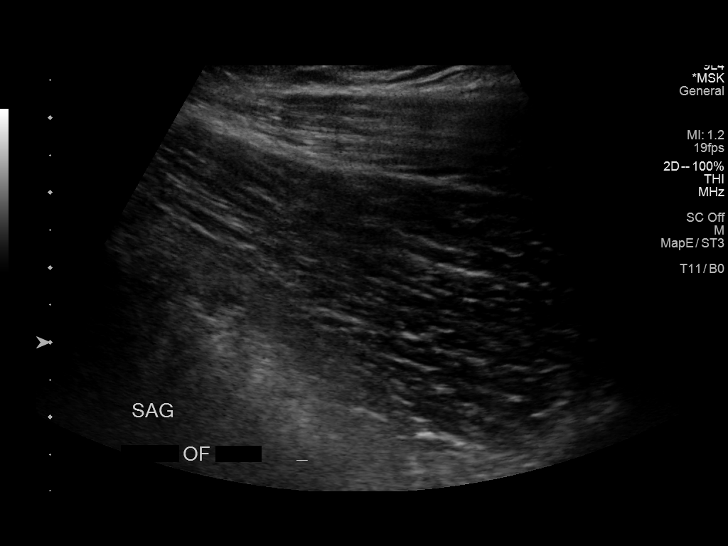
[im 14/17]
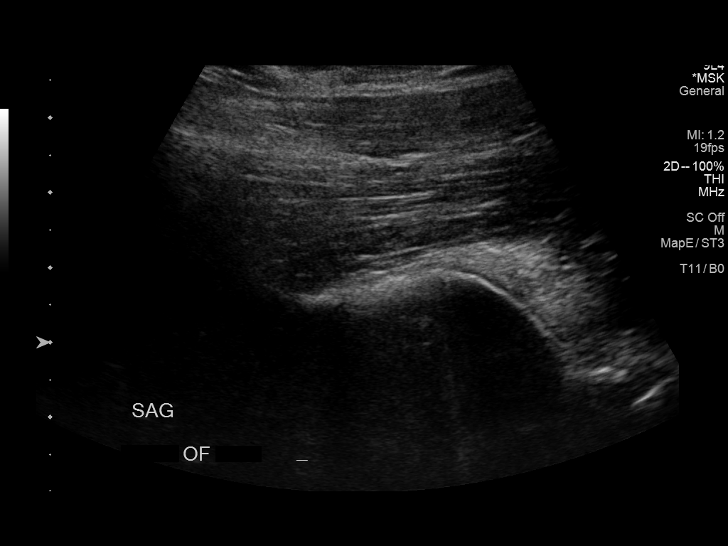
[im 16/17]
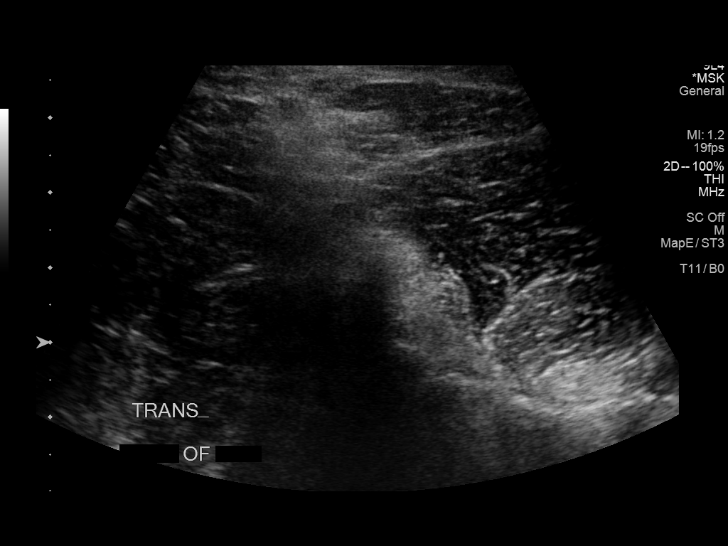
[im 17/17]
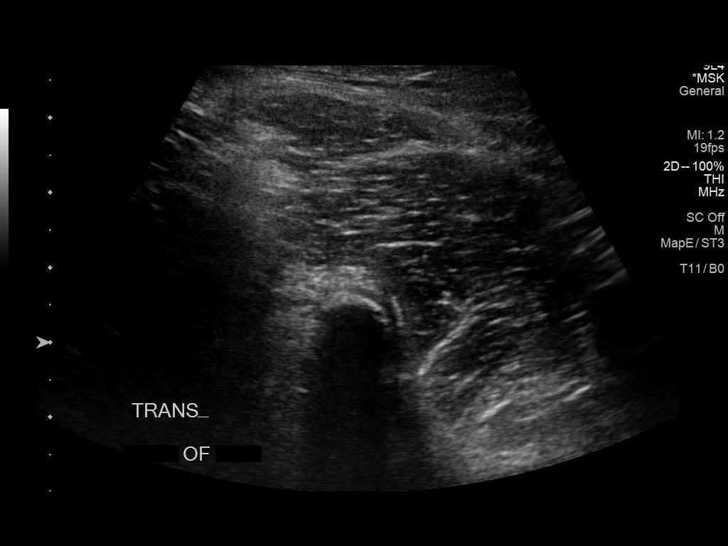

[14 of 17 positions shown; findings below may reference images not displayed]

FINDINGS: Real-time ultrasound was performed over the area of concern at the
anterolateral aspect of the right hip and inguinal region.

There is no evidence of inguinal hernia, mass, or adenopathy.
IMPRESSION: Negative ultrasound exam of the area of concern.

## 2019-08-29 IMAGING — MR MR HIP*R* W/CM
4 of 6 series · 16 of 40 positions shown · non-contrast
Comparison: None.

Plain films right hip 08/11/2017.

CLINICAL DATA: Right hip pain for 4 months.  No known injury.

EXAM:
MR OF THE RIGHT HIP WITHOUT CONTRAST
TECHNIQUE: Multiplanar, multisequence MR imaging was performed. No intravenous
contrast was administered.

[Series 3: T1 · coronal · 4.0mm · 0.53mm/px · 7 of 24 slices shown]
[im 1/24]
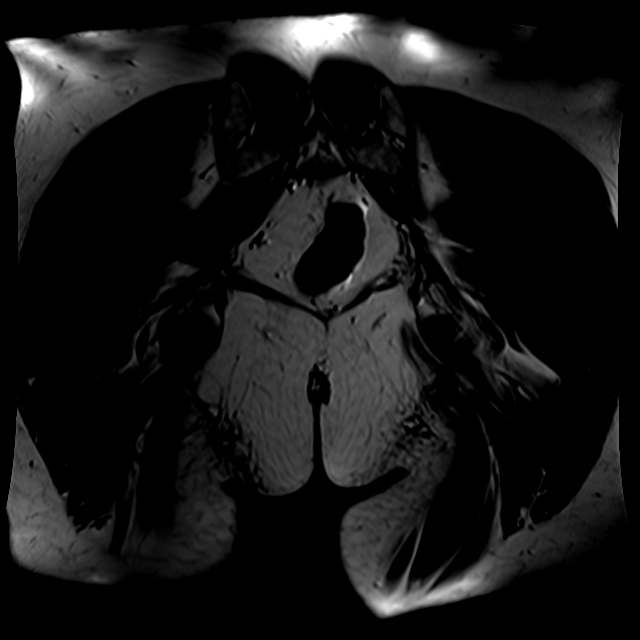
[im 4/24]
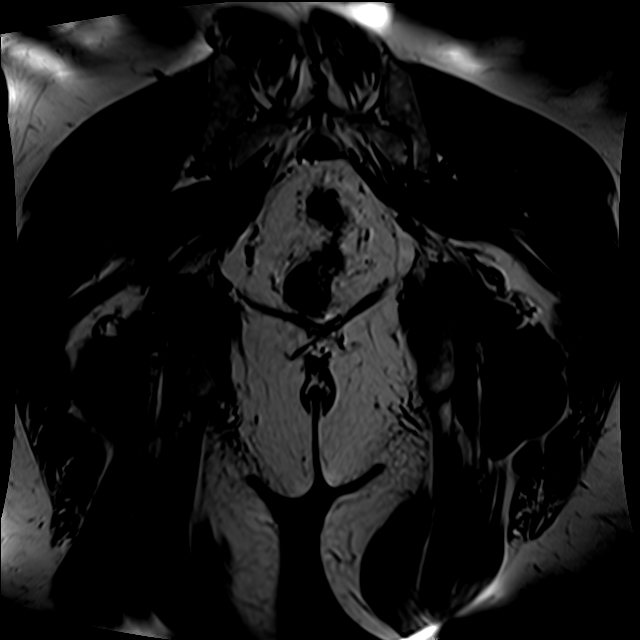
[im 7/24]
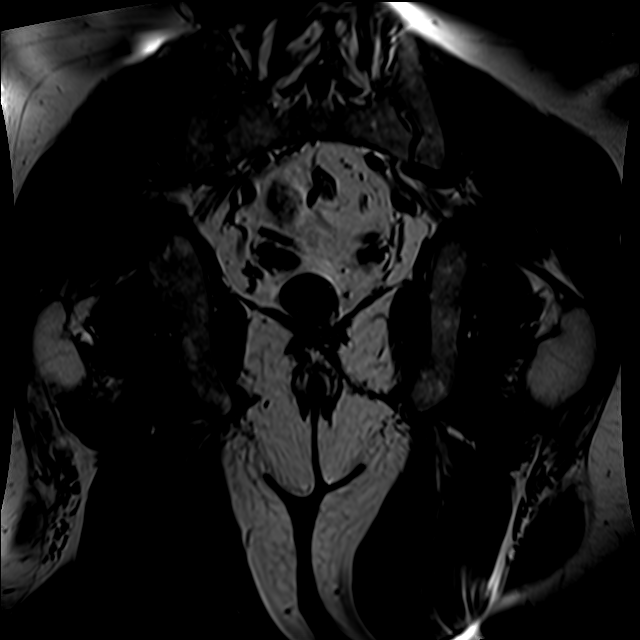
[im 10/24]
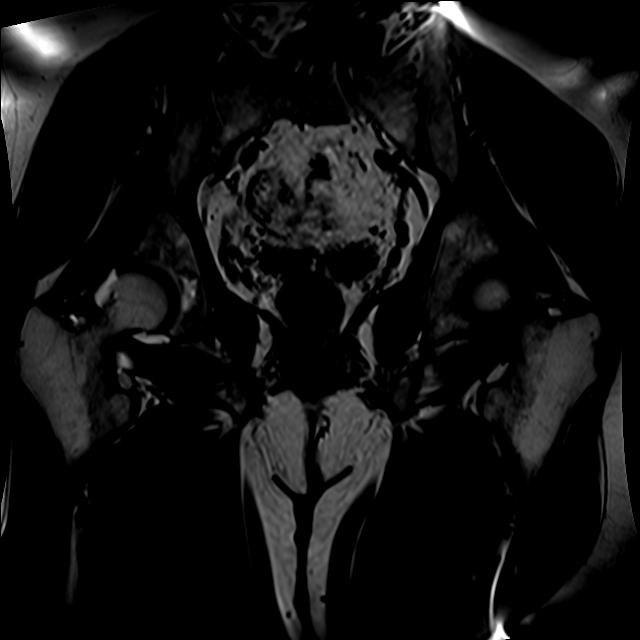
[im 14/24]
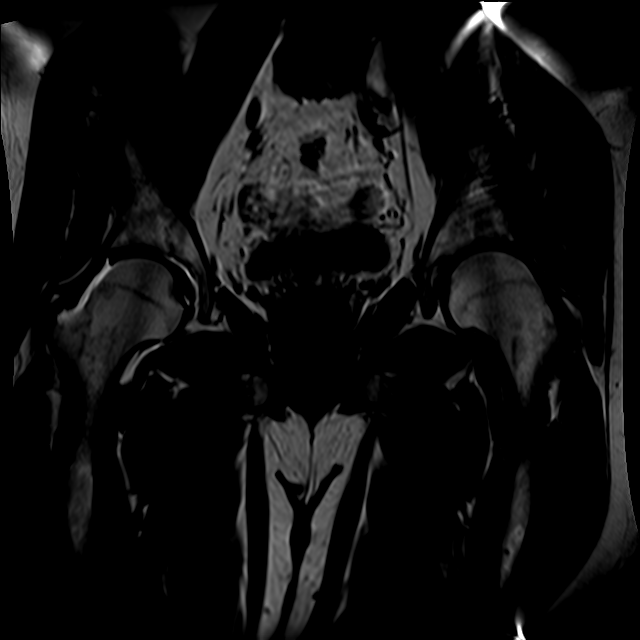
[im 17/24]
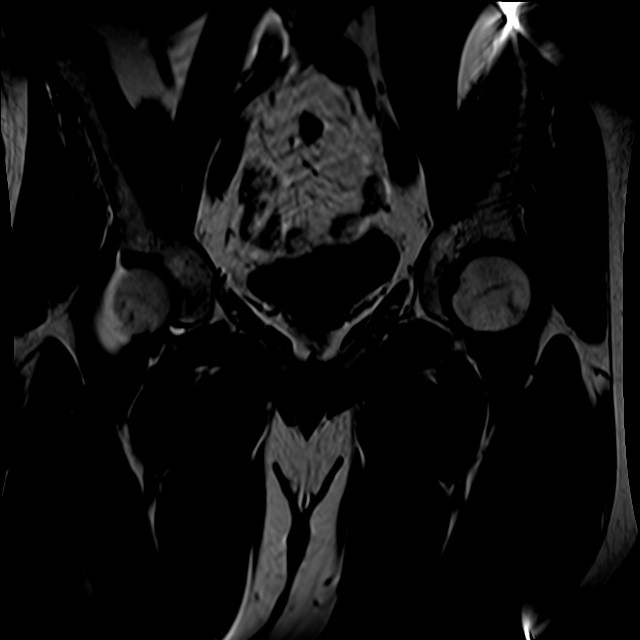
[im 20/24]
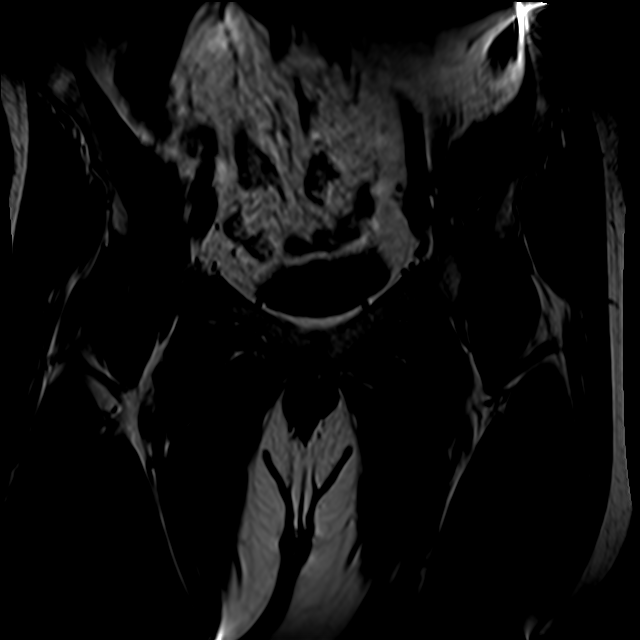

[Series 4: T2 fat-sat · coronal · 4.0mm · 0.53mm/px · 3 of 24 slices shown]
[im 4/24]
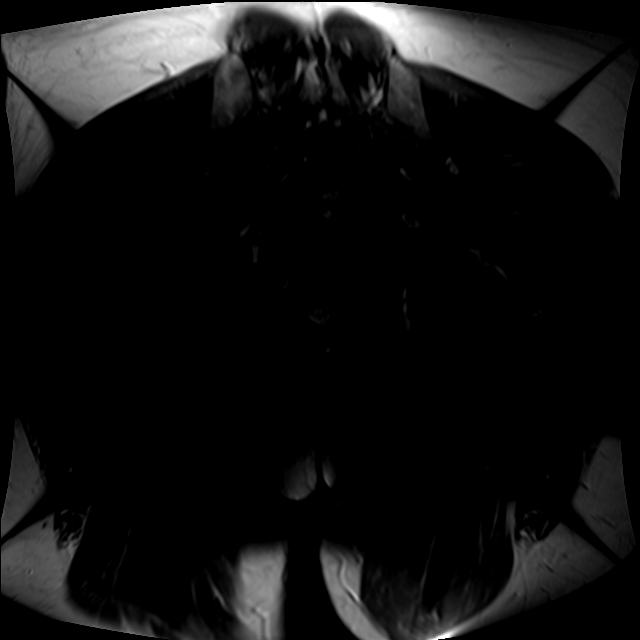
[im 12/24]
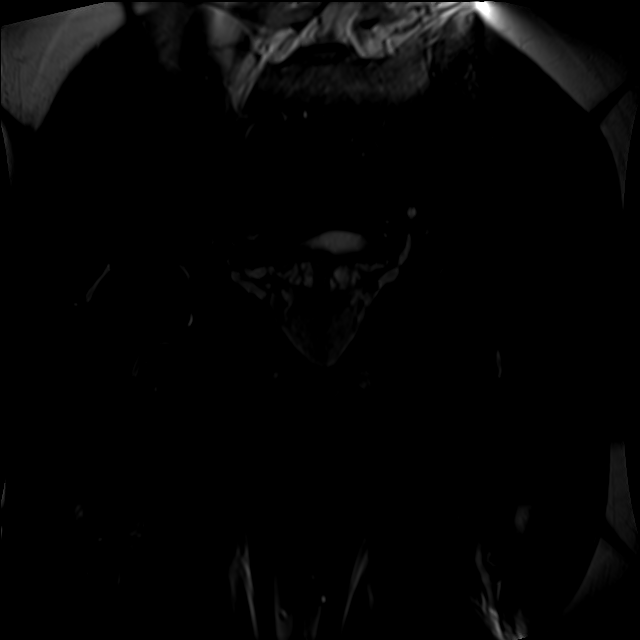
[im 20/24]
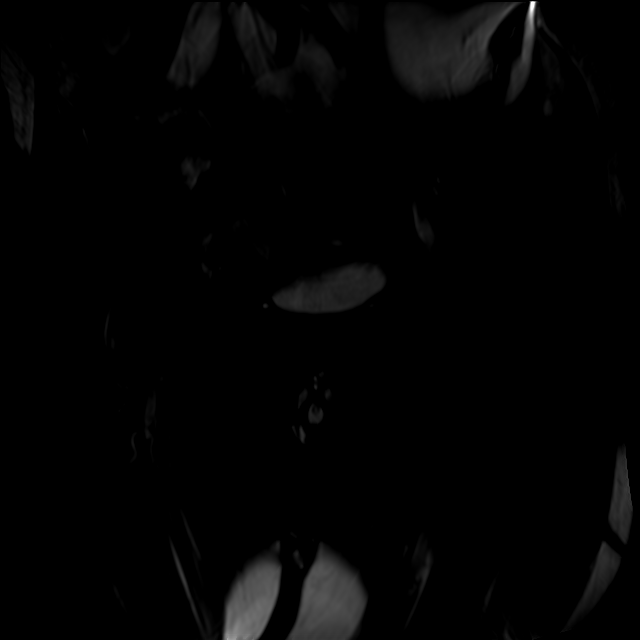

[Series 6: (id) plane reposition · axial · 8.0mm · 0.78mm/px · z∈[-69,+186]mm · 3 of 9 slices shown]
[im 1/9]
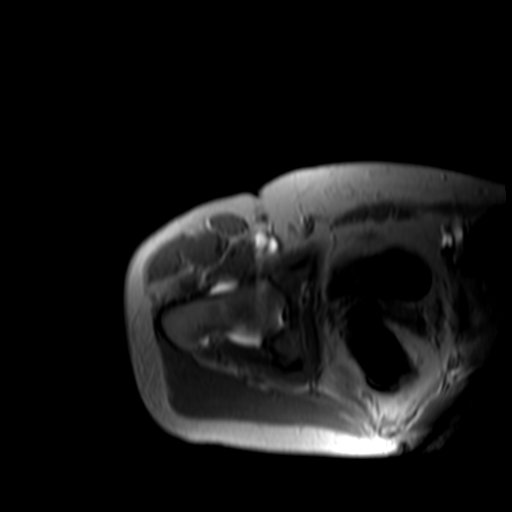
[im 5/9]
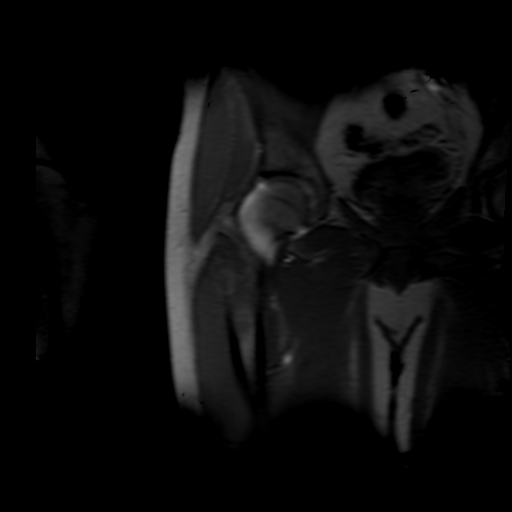
[im 9/9]
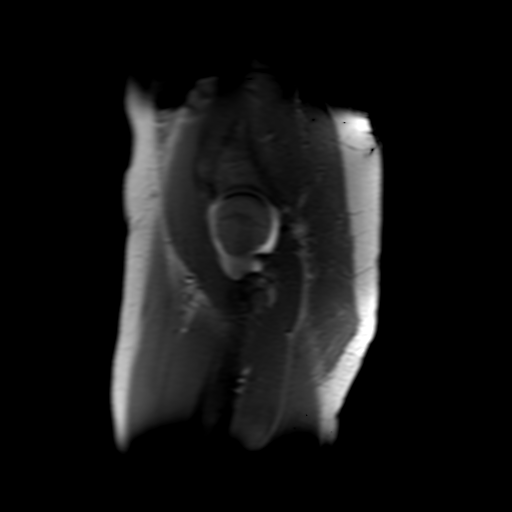

[Series 7: T1 fat-sat · axial · 4.0mm · 0.70mm/px · z∈[-72,-11]mm · 3 of 24 slices shown]
[im 4/24]
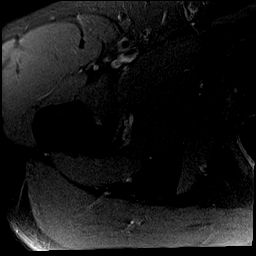
[im 12/24]
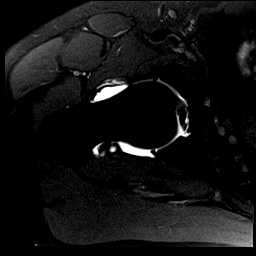
[im 20/24]
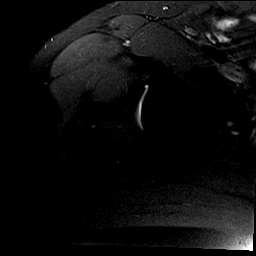

[16 of 40 positions shown; findings below may reference images not displayed]

FINDINGS: Bones: Marrow signal is normal throughout without fracture, stress
change or focal lesion. No subchondral cyst formation or edema about
the hips. No avascular necrosis of the femoral heads.

Articular cartilage and labrum

Articular cartilage:  Normal.

Labrum: The anterior, superior labrum is degenerated and diminutive.
A small volume of contrast extends into the substance of the
superior labrum.

Joint or bursal effusion

Joint effusion: The right hip is distended with contrast. No left
hip effusion.

Bursae: Negative.

Muscles and tendons

Muscles and tendons:  Intact and normal in appearance.

Other findings

Miscellaneous:   Imaged intrapelvic contents are unremarkable.
IMPRESSION: Degeneration tearing of the anterior, superior and superior labrum
as described above. The study is otherwise negative.

## 2019-09-07 ENCOUNTER — Other Ambulatory Visit: Payer: Self-pay | Admitting: Orthopedic Surgery

## 2019-09-07 DIAGNOSIS — M25852 Other specified joint disorders, left hip: Secondary | ICD-10-CM

## 2019-09-12 IMAGING — DX DG CHEST 2V
2 series · 2 of 2 positions shown · non-contrast
Comparison: None.

CLINICAL DATA: Cough and fever

EXAM:
CHEST - 2 VIEW

[dg chest 2 view (1 of 2)]
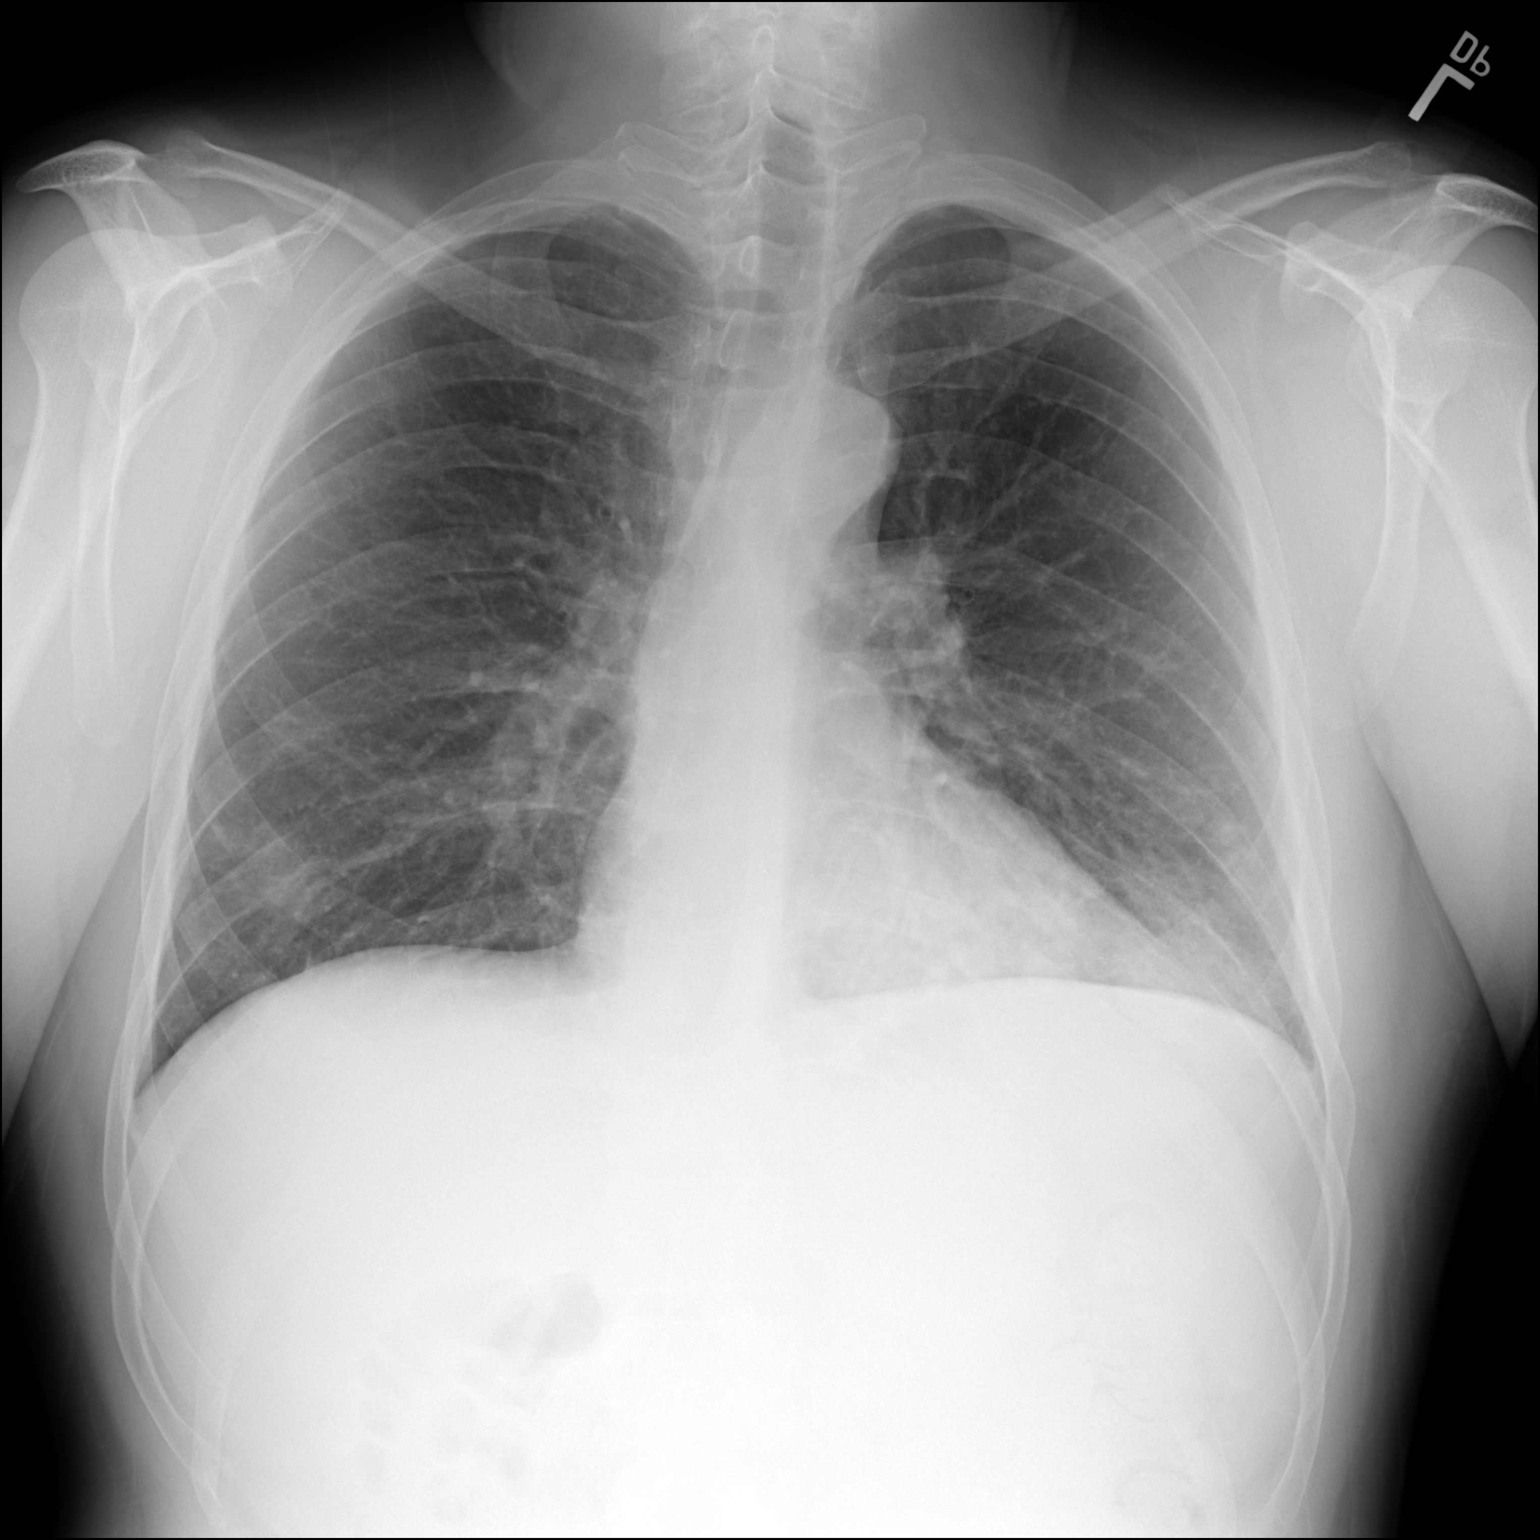

[dg chest 2 view (2 of 2)]
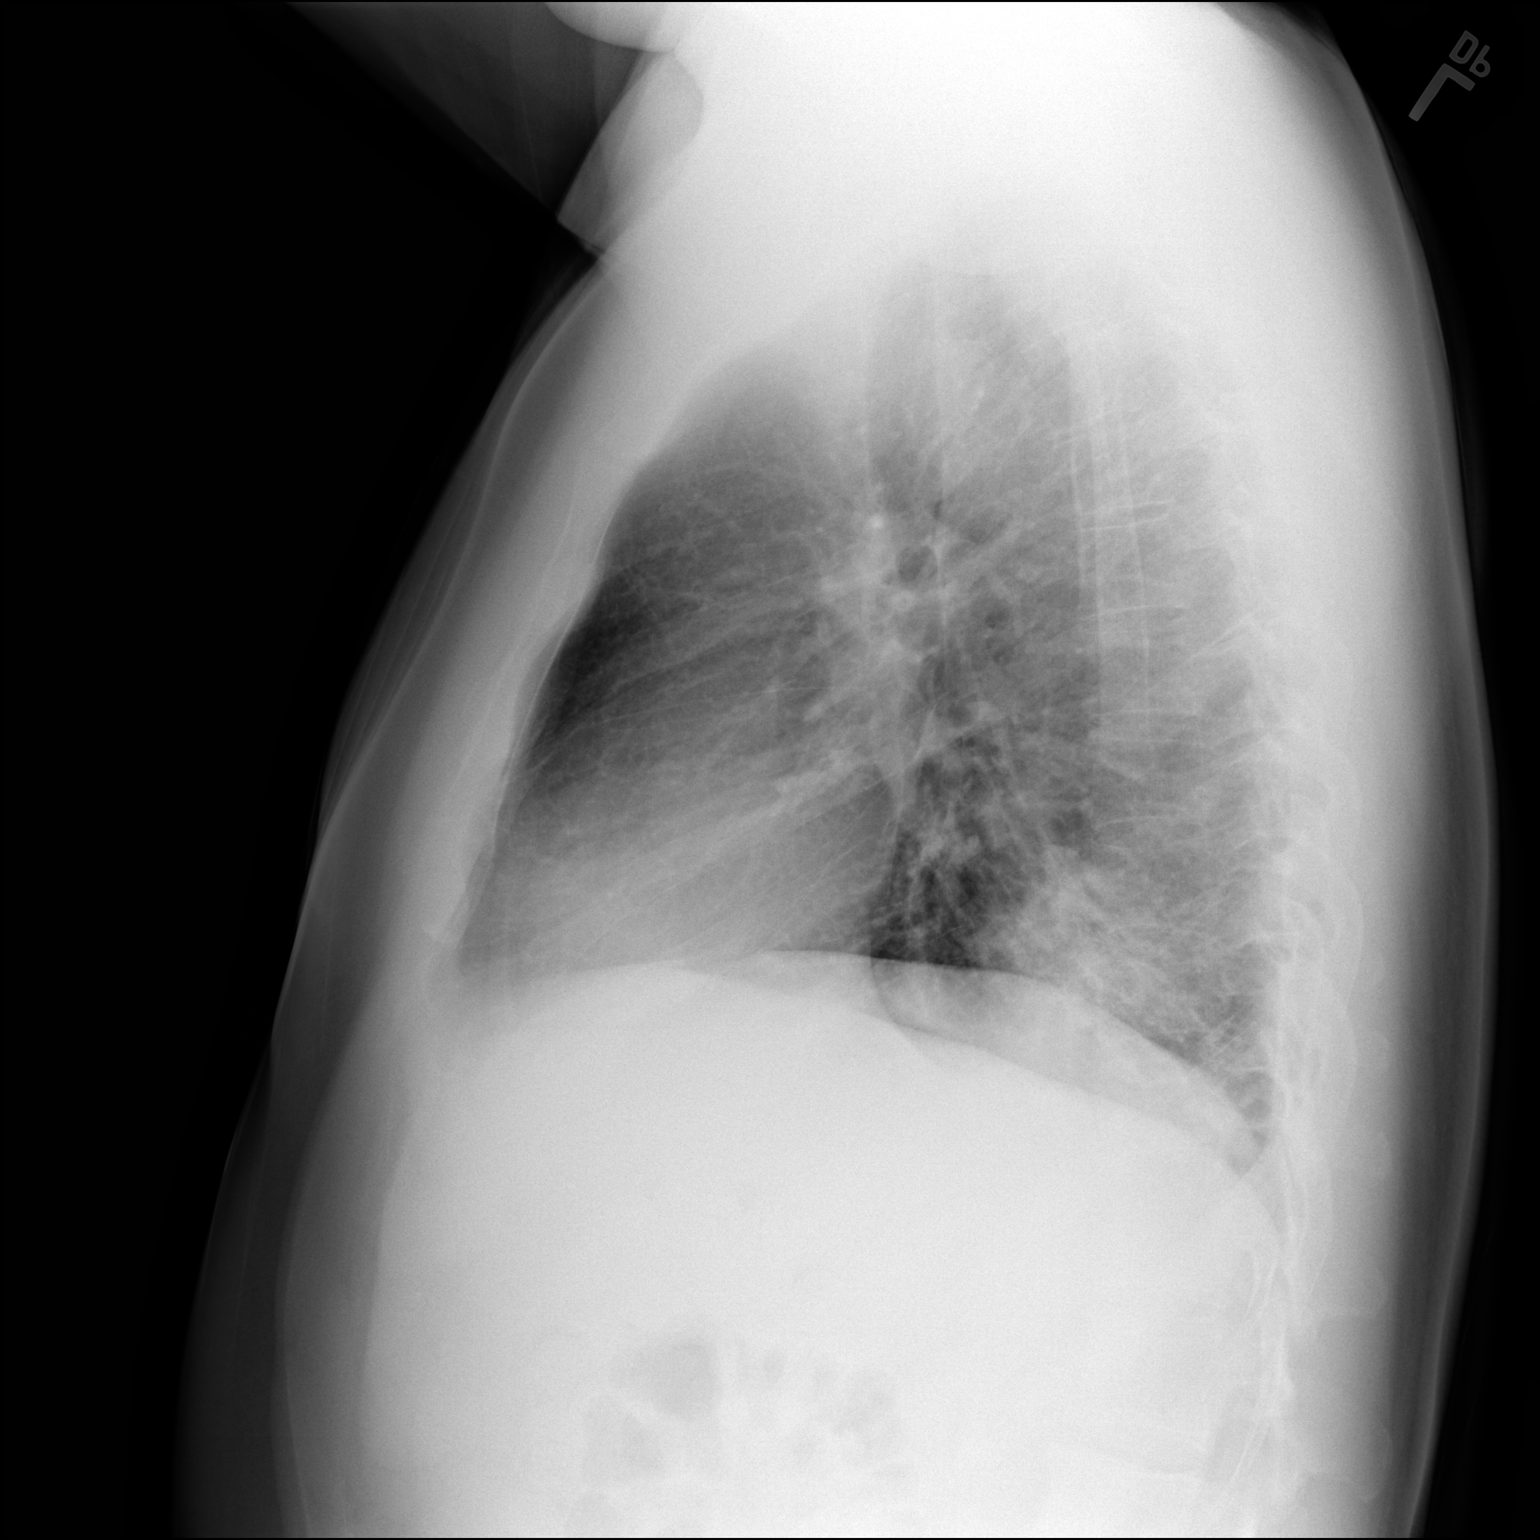

[2 of 2 positions shown; findings below may reference images not displayed]

FINDINGS: There is airspace consolidation in the posterior left base region.
Lungs elsewhere are clear. Heart size and pulmonary vascularity are
normal. No adenopathy. No bone lesions.
IMPRESSION: Airspace consolidation consistent with pneumonia posterior left base
region. Lungs elsewhere are clear. Heart size and contour normal. No
adenopathy evident.

Followup PA and lateral chest radiographs recommended in 3-4 weeks
following trial of antibiotic therapy to ensure resolution and
exclude underlying malignancy.

These results will be called to the ordering clinician or
representative by the Radiologist Assistant, and communication
documented in the PACS or zVision Dashboard.

## 2019-09-29 ENCOUNTER — Other Ambulatory Visit: Payer: 59

## 2019-10-04 ENCOUNTER — Other Ambulatory Visit: Payer: Self-pay

## 2019-10-04 ENCOUNTER — Ambulatory Visit
Admission: RE | Admit: 2019-10-04 | Discharge: 2019-10-04 | Disposition: A | Discharge status: Home / Self Care | Payer: 59 | Source: Ambulatory Visit | Attending: Orthopedic Surgery | Admitting: Orthopedic Surgery

## 2019-10-04 DIAGNOSIS — M25852 Other specified joint disorders, left hip: Secondary | ICD-10-CM

## 2019-10-04 MED ORDER — IOPAMIDOL (ISOVUE-M 200) INJECTION 41%
10.0000 mL | Freq: Once | INTRAMUSCULAR | Status: AC
  Administered 2019-10-04: 10 mL via INTRA_ARTICULAR

## 2019-11-04 NOTE — Patient Instructions (Addendum)
Health Maintenance Due  Topic Date Due  . INFLUENZA VACCINE In office flu shot today 08/07/2019   Please stop by lab before you go If you have mychart- we will send your results within 3 business days of [place] receiving them.  If you do not have mychart- we will call you about results within 5 business days of [place] receiving them.  *please note we are currently using [facility] which has a longer processing time than [place] typically so labs may not come back as quickly as in the past *please also note that you will see labs on mychart as soon as they post. I will later go in and write notes on them- will say "notes from Dr. Durene Cal"  With surgery and on pain meds Could try prune juice daily at least 4 oz when on narcotics. Can also use miralax half capful daily and up to full capful if needed daily - call me if issues

## 2019-11-04 NOTE — Progress Notes (Signed)
Phone: [phone]    Subjective:  Patient presents today for their annual physical. Chief complaint-noted.   See problem oriented charting- ROS- full  review of systems was completed and negative  except for: joint pain, back pain  The following were reviewed and entered/updated in epic: Past Medical History:  Diagnosis Date  . ALLERGIC RHINITIS 09/17/2009  . ASTHMA 09/17/2009  . HYPERLIPIDEMIA 09/17/2009   Patient Active Problem List   Diagnosis Date Noted  . Dyslipidemia 09/17/2009    Priority: Medium  . Allergic rhinitis 09/17/2009    Priority: Medium  . Asthma 09/17/2009    Priority: Medium  . IBS-D 10/11/2015    Priority: Low  . Labral tear of right hip joint 01/28/2018  . Right hip pain 10/13/2017  . Quadriceps tendinitis 11/06/2015  . SI (sacroiliac) joint dysfunction 11/06/2015  . Nonallopathic lesion of sacral region 11/06/2015  . Nonallopathic lesion of lumbosacral region 11/06/2015  . Nonallopathic lesion of thoracic region 11/06/2015   Past Surgical History:  Procedure Laterality Date  . HIP SURGERY     Oct 2020  . NASAL SINUS SURGERY Right 12/04/2014   Dr. Jenne Pane  . SINUS EXPLORATION  Nov 2014    per Dr. Jenne Pane     Family History  Problem Relation Age of Onset  . Healthy Mother   . Sinusitis Father        recurrent- sinus surgery multiple. rare fungal infection 20 cases in world.   Marland Kitchen CAD Father        age 59 first heart attack. never smoker  . Alcohol abuse Father   . Depression Father        abused pain pills in past  . Atrial fibrillation Father   . Kidney disease Father        lifestyle/treatment of sinus issues related  . Healthy Brother   . Healthy Son   . Healthy Son     Medications- reviewed and updated Current Outpatient Medications  Medication Sig Dispense Refill  . albuterol (PROVENTIL HFA;VENTOLIN HFA) 108 (90 Base) MCG/ACT inhaler Take 2 puffs by mouth every 6 hours as needed 18 Inhaler 0  . Ascorbic Acid (VITAMIN C) 1000 MG  tablet Take 1,000 mg by mouth daily.    . Cholecalciferol (VITAMIN D3) 50 MCG (2000 UT) TABS Take 1 tablet by mouth daily.    Marland Kitchen EPINEPHrine 0.3 mg/0.3 mL IJ SOAJ injection as directed.    . fexofenadine (ALLEGRA) 180 MG tablet Take 180 mg by mouth daily.    . fluticasone (FLONASE) 50 MCG/ACT nasal spray 2 sprays by Nasal route daily.      . montelukast (SINGULAIR) 10 MG tablet TAKE 1 TABLET IN THE EVENING ONCE A DAY  5  . Olopatadine HCl (PATANASE) 0.6 % SOLN by Nasal route. 2 sprays to each nostril daily     . QUERCETIN PO Take 500 mg by mouth daily.    Marland Kitchen zinc gluconate 50 MG tablet Take 50 mg by mouth daily.     No current facility-administered medications for this visit.    Allergies-reviewed and updated No Known Allergies  Social History   Social History Narrative   Married. 2 children. [name] (May 2016). [name] (July 2013). [employer]   Wife works as Emergency planning/management officer since 2012. Investigative division in 2021.       Hobbies: time with sons, going to gym- goal 7 days a week   [employer]      Objective:  BP 118/80   Pulse 74   Temp 98.1 F (36.7 C) (Temporal)   Resp 18   Ht 6\' 1"  (1.854 m)   Wt 207 lb (93.9 kg)   SpO2 100%   BMI 27.31 kg/m  Gen: NAD, resting comfortably HEENT: Mucous membranes are moist. Oropharynx normal Neck: no thyromegaly CV: RRR no murmurs rubs or gallops Lungs: CTAB no crackles, wheeze, rhonchi Abdomen: soft/nontender/nondistended/normal bowel sounds. No rebound or guarding.  Ext: no edema Skin: warm, dry Neuro: grossly normal, moves all extremities, PERRLA     Assessment and Plan:  39 y.o. male presenting for annual physical.  Health Maintenance counseling: 1. Anticipatory guidance: Patient counseled regarding regular dental exams q6 months, eye exams- yearly with astigmatism ,  avoiding smoking and second hand smoke, limiting alcohol to 2 beverages per day- well under, no illicit substances 2. Risk factor reduction:   Advised patient of need for regular exercise and diet rich and fruits and vegetables to reduce risk of heart attack and stroke. Exercise- limited by the left hip - limited to just walking right now- other exercises hurt including free weights as well. Diet-long term goal 195- didn't tolerate intermittent fasting.  Wt Readings from Last 3 Encounters:  11/09/19 207 lb (93.9 kg)  09/08/18 207 lb (93.9 kg)  07/22/18 207 lb (93.9 kg)  3. Immunizations/screenings/ancillary studies- flu shot today.  Discussed hepatitis C screening Immunization History  Administered Date(s) Administered  . Influenza Split 09/18/2011, 11/05/2015  . Influenza,inj,Quad PF,6+ Mos 11/02/2012, 01/25/2014, 10/20/2014, 10/14/2018  . Influenza-Unspecified 10/08/2017, 10/14/2018  . PFIZER SARS-COV-2 Vaccination 09/03/2019, 09/27/2019  . Pneumococcal Polysaccharide-23 11/02/2012  . Tdap 09/18/2011  4. Prostate cancer screening-   No family history, start at age 9   5. Colon cancer screening -  no family history, start at age 20  6. Skin cancer screening/prevention-[facility]- seeing PA. advised regular sunscreen use. Denies worrisome, changing, or new skin lesions.  7. Testicular cancer screening- advised monthly self exams  8. STD screening- patient opts out as monogamous  9. Never smoker  Status of chronic or acute concerns   #hyperlipidemia #Family history of early MI Father age 69- alcoholic and abusing prescription drugs and sedentary S: Medication: strongly prefers to work on lifestyle over meds  -Have discussed lifestyle changes in the past. Has been overdoing fried foods and ice cream Lab Results  Component Value Date   CHOL 219 (H) 08/12/2017   HDL 46.60 08/12/2017   LDLCALC 117 (H) 05/12/2016   LDLDIRECT 153.0 08/12/2017   TRIG 208.0 (H) 08/12/2017   CHOLHDL 5 08/12/2017   A/P: update lipids today. Discussed dietary changes and starting more intense exercise once hip in better  position- can work on walking for now -discussed possible coronary calcium scoring - wants to work on lifestyle first  #Asthma/allergies -follows with Dr. 10/12/2017.  On Singulair and olopatadine- has not needed recently.  Also on add triple star.  Previously on Arnuity. Has not had to do immunology in a year. Nasal washes and vitamin C help.   #IBS-D-avoiding fatty foods helps.  We have previously discussed cutting down on FODMAP foods. Madie Reno and queso cheese were triggers-doing better off of that. No recent issues  #Left hip pain-femoral acetabular impingement-planned surgery January 10 2020 [facility]. [facility]. Did well with prior right hip surgery. Will take 2.5 weeks off and then be able to do some work from home  Recommended follow up: Return in about 1 year (around 11/08/2020) for physical or  sooner if needed.  Lab/Order associations: fasting   ICD-10-CM   1. Preventative health care  Z00.00 CBC with Differential/Platelet    COMPLETE METABOLIC PANEL WITH GFR    Hepatitis C antibody    Lipid Panel w/reflex Direct LDL  2. Dyslipidemia  E78.5 CBC with Differential/Platelet    COMPLETE METABOLIC PANEL WITH GFR    Lipid Panel w/reflex Direct LDL  3. Encounter for hepatitis C screening test for low risk patient  Z11.59 Hepatitis C antibody    Return precautions advised.  Tana Conch, MD

## 2019-11-09 ENCOUNTER — Encounter: Payer: Self-pay | Admitting: Family Medicine

## 2019-11-09 ENCOUNTER — Ambulatory Visit (INDEPENDENT_AMBULATORY_CARE_PROVIDER_SITE_OTHER): Payer: 59 | Admitting: Family Medicine

## 2019-11-09 ENCOUNTER — Other Ambulatory Visit: Payer: Self-pay | Admitting: *Deleted

## 2019-11-09 ENCOUNTER — Other Ambulatory Visit: Payer: Self-pay

## 2019-11-09 VITALS — BP 118/80 | HR 74 | Temp 98.1°F | Resp 18 | Ht 73.0 in | Wt 207.0 lb

## 2019-11-09 DIAGNOSIS — Z0000 Encounter for general adult medical examination without abnormal findings: Secondary | ICD-10-CM

## 2019-11-09 DIAGNOSIS — Z23 Encounter for immunization: Secondary | ICD-10-CM

## 2019-11-09 DIAGNOSIS — Z1159 Encounter for screening for other viral diseases: Secondary | ICD-10-CM | POA: Diagnosis not present

## 2019-11-09 DIAGNOSIS — E785 Hyperlipidemia, unspecified: Secondary | ICD-10-CM | POA: Diagnosis not present

## 2019-11-10 ENCOUNTER — Encounter: Payer: Self-pay | Admitting: Family Medicine

## 2019-11-10 LAB — COMPLETE METABOLIC PANEL WITH GFR
AG Ratio: 2 (calc) (ref 1.0–2.5)
ALT: 20 U/L (ref 9–46)
AST: 17 U/L (ref 10–40)
Albumin: 4.4 g/dL (ref 3.6–5.1)
Alkaline phosphatase (APISO): 71 U/L (ref 36–130)
BUN: 15 mg/dL (ref 7–25)
CO2: 28 mmol/L (ref 20–32)
Calcium: 9.5 mg/dL (ref 8.6–10.3)
Chloride: 104 mmol/L (ref 98–110)
Creat: 1.04 mg/dL (ref 0.60–1.35)
GFR, Est African American: 106 mL/min/{1.73_m2} (ref >=60)
GFR, Est Non African American: 91 mL/min/{1.73_m2} (ref >=60)
Globulin: 2.2 g/dL (calc) (ref 1.9–3.7)
Glucose, Bld: 91 mg/dL (ref 65–99)
Potassium: 4.4 mmol/L (ref 3.5–5.3)
Sodium: 139 mmol/L (ref 135–146)
Total Bilirubin: 0.4 mg/dL (ref 0.2–1.2)
Total Protein: 6.6 g/dL (ref 6.1–8.1)

## 2019-11-10 LAB — CBC WITH DIFFERENTIAL/PLATELET
Absolute Monocytes: 393 cells/uL (ref 200–950)
Basophils Absolute: 31 cells/uL (ref 0–200)
Basophils Relative: 0.6 %
Eosinophils Absolute: 153 cells/uL (ref 15–500)
Eosinophils Relative: 3 %
HCT: 47.3 % (ref 38.5–50.0)
Hemoglobin: 15.9 g/dL (ref 13.2–17.1)
Lymphs Abs: 1856 cells/uL (ref 850–3900)
MCH: 30.4 pg (ref 27.0–33.0)
MCHC: 33.6 g/dL (ref 32.0–36.0)
MCV: 90.4 fL (ref 80.0–100.0)
MPV: 11.7 fL (ref 7.5–12.5)
Monocytes Relative: 7.7 %
Neutro Abs: 2667 cells/uL (ref 1500–7800)
Neutrophils Relative %: 52.3 %
Platelets: 181 10*3/uL (ref 140–400)
RBC: 5.23 10*6/uL (ref 4.20–5.80)
RDW: 12 % (ref 11.0–15.0)
Total Lymphocyte: 36.4 %
WBC: 5.1 10*3/uL (ref 3.8–10.8)

## 2019-11-10 LAB — LIPID PANEL W/REFLEX DIRECT LDL
Cholesterol: 219 mg/dL — ABNORMAL HIGH (ref <200)
HDL: 42 mg/dL (ref >=40)
LDL Cholesterol (Calc): 143 mg/dL (calc) — ABNORMAL HIGH
Non-HDL Cholesterol (Calc): 177 mg/dL (calc) — ABNORMAL HIGH (ref <130)
Total CHOL/HDL Ratio: 5.2 (calc) — ABNORMAL HIGH (ref <5.0)
Triglycerides: 207 mg/dL — ABNORMAL HIGH (ref <150)

## 2019-11-10 LAB — HEPATITIS C ANTIBODY
Hepatitis C Ab: NONREACTIVE
SIGNAL TO CUT-OFF: 0.1 (ref <1.00)

## 2020-02-16 ENCOUNTER — Encounter: Payer: Self-pay | Admitting: Podiatry

## 2020-02-16 ENCOUNTER — Ambulatory Visit (INDEPENDENT_AMBULATORY_CARE_PROVIDER_SITE_OTHER): Payer: 59

## 2020-02-16 ENCOUNTER — Ambulatory Visit: Payer: 59 | Admitting: Podiatry

## 2020-02-16 ENCOUNTER — Other Ambulatory Visit: Payer: Self-pay

## 2020-02-16 DIAGNOSIS — M79672 Pain in left foot: Secondary | ICD-10-CM

## 2020-02-16 DIAGNOSIS — M79671 Pain in right foot: Secondary | ICD-10-CM | POA: Diagnosis not present

## 2020-02-16 DIAGNOSIS — L84 Corns and callosities: Secondary | ICD-10-CM

## 2020-02-16 DIAGNOSIS — M216X9 Other acquired deformities of unspecified foot: Secondary | ICD-10-CM | POA: Diagnosis not present

## 2020-02-16 NOTE — Progress Notes (Signed)
Subjective:   Patient ID: Tony [name], male   DOB: 40 y.o.   MRN: [id number]   HPI Patient presents stating he has had some pain on the top of his left foot and 6 weeks ago had a dislocation procedure done associated with the labrum tear procedure left and the developed some kind of a problem with the circulation or other pathology top of his foot.  Also has lesion formation bilateral and patient does not smoke and is just started to weight-bear again   Review of Systems  All other systems reviewed and are negative.       Objective:  Physical Exam Vitals and nursing note reviewed.  Constitutional:      Appearance: He is well-developed and well-nourished.  Cardiovascular:     Pulses: Intact distal pulses.  Pulmonary:     Effort: Pulmonary effort is normal.  Musculoskeletal:        General: Normal range of motion.  Skin:    General: Skin is warm.  Neurological:     Mental Status: He is alert.     Neurovascular status intact muscle strength was found to be adequate range of motion adequate.  I did not note mottled skin formation coldness or sweating of the foot and it did have normal range of motion with moderate discomfort dorsal lateral foot with keratotic lesion subfirst metatarsal left subfourth metatarsal left and into the heels on the left     Assessment:  Possibility of low-grade chronic pain syndrome versus vascular incident with the dislocation procedure with lesion formation     Plan:  H&P x-rays reviewed debridement accomplished discussed warm treatments he can do and range of motion and to continue to weight-bear.  I am hopeful this will be a temporary situation and if symptoms were to persist I want to see him back and he may require more aggressive treatment  X-rays indicate no signs of fracture no signs of model bone appearance and normal appearance between right and left with cavus structure

## 2020-10-31 NOTE — Progress Notes (Signed)
Phone: [phone]    Subjective:  Patient presents today for their annual physical. Chief complaint-noted.   See problem oriented charting- ROS- full  review of systems was completed and negative  except for: appetite change, sad mood with what he is dealing with, penile pain  The following were reviewed and entered/updated in epic: Past Medical History:  Diagnosis Date   ALLERGIC RHINITIS 09/17/2009   ASTHMA 09/17/2009   HYPERLIPIDEMIA 09/17/2009   Patient Active Problem List   Diagnosis Date Noted   Dyslipidemia 09/17/2009    Priority: Medium    Allergic rhinitis 09/17/2009    Priority: Medium    Asthma 09/17/2009    Priority: Medium    IBS-D 10/11/2015    Priority: Low   Labral tear of right hip joint 01/28/2018   Right hip pain 10/13/2017   Quadriceps tendinitis 11/06/2015   SI (sacroiliac) joint dysfunction 11/06/2015   Nonallopathic lesion of sacral region 11/06/2015   Nonallopathic lesion of lumbosacral region 11/06/2015   Nonallopathic lesion of thoracic region 11/06/2015   Past Surgical History:  Procedure Laterality Date   HIP SURGERY     Oct 2020   NASAL SINUS SURGERY Right 12/04/2014   Dr. Jenne Pane   SINUS EXPLORATION  Nov 2014    per Dr. Jenne Pane     Family History  Problem Relation Age of Onset   Healthy Mother    Sinusitis Father        recurrent- sinus surgery multiple. rare fungal infection 20 cases in world.    CAD Father        age 21 first heart attack. never smoker   Alcohol abuse Father    Depression Father        abused pain pills in past   Atrial fibrillation Father    Kidney disease Father        lifestyle/treatment of sinus issues related   Stroke Father        related to a fib- watchman device   Dementia Father        vascular   Healthy Brother    Healthy Son    Healthy Son     Medications- reviewed and updated Current Outpatient Medications  Medication Sig Dispense Refill   albuterol (PROVENTIL HFA;VENTOLIN HFA) 108 (90  Base) MCG/ACT inhaler Take 2 puffs by mouth every 6 hours as needed 18 Inhaler 0   Ascorbic Acid (VITAMIN C) 1000 MG tablet Take 1,000 mg by mouth daily.     DOXYCYCLINE PO Take by mouth 2 (two) times daily.     EPINEPHrine 0.3 mg/0.3 mL IJ SOAJ injection as directed.     fexofenadine (ALLEGRA) 180 MG tablet Take 180 mg by mouth daily.     fluticasone (FLONASE) 50 MCG/ACT nasal spray 2 sprays by Nasal route daily.       montelukast (SINGULAIR) 10 MG tablet TAKE 1 TABLET IN THE EVENING ONCE A DAY  5   Olopatadine HCl 0.6 % SOLN by Nasal route. 2 sprays to each nostril daily      QUERCETIN PO Take 500 mg by mouth daily.     zinc gluconate 50 MG tablet Take 50 mg by mouth daily.     Cholecalciferol (VITAMIN D3) 50 MCG (2000 UT) TABS Take 1 tablet by mouth daily. (Patient not taking: Reported on 11/09/2020)     Multiple Vitamin (MULTI-VITAMIN) tablet Take 1 tablet by mouth daily. (Patient not taking: Reported on 11/09/2020)     No current facility-administered medications for this  visit.    Allergies-reviewed and updated No Known Allergies  Social History   Social History Narrative   Married. 2 children. [name] (May 2016). [name] (July 2013). [employer]   Wife works as Emergency planning/management officer since 2012. Investigative division in 2021.       Hobbies: time with sons, going to gym- goal 7 days a week   [name]      Objective:  BP 122/80   Pulse 92   Temp 98.3 F (36.8 C) (Temporal)   Ht 6\' 1"  (1.854 m)   Wt 208 lb (94.3 kg)   SpO2 97%   BMI 27.44 kg/m  Gen: NAD, resting comfortably HEENT: Mucous membranes are moist. Oropharynx normal Neck: no thyromegaly CV: RRR no murmurs rubs or gallops Lungs: CTAB no crackles, wheeze, rhonchi Abdomen: soft/nontender/nondistended/normal bowel sounds. No rebound or guarding.  Ext: no edema Skin: warm, dry, multiple erythematous ulcerations with some vesicles (one set unroofed for testing) above penis- also with some  ulcerations/vesicles at tip of penis and just below glans Neuro: grossly normal, moves all extremities, PERRLA     Assessment and Plan:  40 y.o. male presenting for annual physical.  Health Maintenance counseling: 1. Anticipatory guidance: Patient counseled regarding regular dental exams -q6 months, eye exams- yearly with astigmatism,  avoiding smoking and second hand smoke, limiting alcohol to 2 beverages per day-well under, no illicit substances 2. Risk factor reduction:  Advised patient of need for regular exercise and diet rich and fruits and vegetables to reduce risk of heart attack and stroke. Exercise- had been exercising at the gym- tweaked right hip recently- trying to be careful plus stress with work and separation. Goal 150 mins a week.  Diet-long term goal 195- minimal weight gain despite stressors- we discussed over net year trying to at least maintain weight and when things settle beginning weight loss efforts again Wt Readings from Last 3 Encounters:  11/09/20 208 lb (94.3 kg)  11/09/19 207 lb (93.9 kg)  09/08/18 207 lb (93.9 kg)  3. Immunizations/screenings/ancillary studies DISCUSSED:  -covid booster  - opts out -Flu vaccination (last one 11/21)- today -Prevnar-20 vaccination #2 - recommended due to asthma Immunization History  Administered Date(s) Administered   Influenza Split 09/18/2011, 11/05/2015   Influenza,inj,Quad PF,6+ Mos 11/02/2012, 01/25/2014, 10/20/2014, 10/14/2018, 11/09/2019, 11/09/2020   Influenza-Unspecified 10/08/2017, 10/14/2018   PFIZER(Purple Top)SARS-COV-2 Vaccination 09/03/2019, 09/27/2019   Pneumococcal Polysaccharide-23 11/02/2012   Tdap 09/18/2011  4. Prostate cancer screening-No family history, start at age 51     5. Colon cancer screening -no family history, start at age 48   6. Skin cancer screening/prevention- [facility]- [facility]. advised regular sunscreen use. Denies worrisome, changing, or new skin lesions.  7.  Testicular cancer screening- advised monthly self exams  8. STD screening- patient had with urgent care for urine and we will add HIV/RPR though he is not overly concerned  9. Smoking associated screening- Never smoker  Status of chronic or acute concerns   # social update- dealing with separation about a month ago  #hyperlipidemia #Family history of early MI Father age 68- alcoholic and abusing prescription drugs and sedentary though S: Medication: strongly preferred to work on lifestyle over meds  -Had discussed lifestyle changes in the past. Had been overdoing fried foods and ice cream Lab Results  Component Value Date   CHOL 219 (H) 11/09/2019   HDL 42 11/09/2019   LDLCALC 143 (H) 11/09/2019   LDLDIRECT 153.0  08/12/2017   TRIG 207 (H) 11/09/2019   CHOLHDL 5.2 (H) 11/09/2019   A/P: Poor control but not in range where we need to start medication-discussed healthy lifestyle changes and considering baseline coronary calcium scoring perhaps age 31    #Asthma/allergies -followed with Dr. Madie Reno in past now working with Dr. Nunzio Cobbs in [place].  On Singulair and xyzal OR allegra. Also on flonase. No longer on eye drops. No maintenance inhalers- on immunotherapy again- had done in the past.    #IBS-D-avoiding fatty foods were helpful. We have previously discussed cutting down on FODMAP foods. [place] and queso cheese were triggers-was doing better off of that.  No recent issues-continue to monitor- has not worsened even with stress    #Left hip pain-femoral acetabular impingement-planned surgery January 10 2020 [place]. Case [facility]. "Procedure:  Left hip arthroscopy Femoroplasty Acetabuloplasty Labral Repair"   did well with prior right hip surgery.  Reports with left hip surgery this year   # Depressed mood S: Medication:none g Counselor: seeing currently  A/P: he feels he has good support through counselor and family and friends- he will reach out if feels like symptoms are  worsening. No SI.   # Folliculitis groin- on doxycycline and using ointment rx by urgent care- started last night . Urine test being sent off for culture- there was some blood.  They are also doing urine std testing.   #penile pain- has started dating after separation. Partner has been tested and he is not concerned about infidelity there. Std panel last night. He feels this could be irritation from lotion  On exam- concern for herpes. He does not want to start treatment for this- wants to continue folliculitis treatment. Check swab of unroofed vesicle. Also check igg and Igm- more info for acute vs chronic. He denies infidelity on his part but there would be potential from either separated wife or current partner.   Recommended follow up: Return in about 1 year (around 11/09/2021) for physical or sooner if needed.  Lab/Order associations: NOT fasting   ICD-10-CM   1. Preventative health care  Z00.00     2. Hyperlipidemia, unspecified hyperlipidemia type  E78.5     3. Moderate persistent asthma without complication  J45.40     4. Allergic rhinitis, unspecified seasonality, unspecified trigger  J30.9     5. Irritable bowel syndrome with diarrhea  K58.0     6. Left hip pain  M25.552     7. Need for immunization against influenza  Z23 Flu Vaccine QUAD 80mo+IM (Fluarix, Fluzone & Alfiuria Quad PF)    8. Screening for HIV (human immunodeficiency virus)  Z11.4     9. Screening examination for venereal disease  Z11.3      No orders of the defined types were placed in this encounter.  I,Jada Bradford,acting as a scribe for Tana Conch, MD.,have documented all relevant documentation on the behalf of Tana Conch, MD,as directed by  Tana Conch, MD while in the presence of Tana Conch, MD.   I, Tana Conch, MD, have reviewed all documentation for this visit. The documentation on 11/09/20 for the exam, diagnosis, procedures, and orders are all accurate and complete.   Return  precautions advised.   Tana Conch, MD

## 2020-11-09 ENCOUNTER — Encounter: Payer: Self-pay | Admitting: Family Medicine

## 2020-11-09 ENCOUNTER — Ambulatory Visit (INDEPENDENT_AMBULATORY_CARE_PROVIDER_SITE_OTHER): Payer: 59 | Admitting: Family Medicine

## 2020-11-09 ENCOUNTER — Other Ambulatory Visit: Payer: Self-pay

## 2020-11-09 VITALS — BP 122/80 | HR 92 | Temp 98.3°F | Ht 73.0 in | Wt 208.0 lb

## 2020-11-09 DIAGNOSIS — M25552 Pain in left hip: Secondary | ICD-10-CM

## 2020-11-09 DIAGNOSIS — J309 Allergic rhinitis, unspecified: Secondary | ICD-10-CM

## 2020-11-09 DIAGNOSIS — J4540 Moderate persistent asthma, uncomplicated: Secondary | ICD-10-CM | POA: Diagnosis not present

## 2020-11-09 DIAGNOSIS — Z23 Encounter for immunization: Secondary | ICD-10-CM | POA: Diagnosis not present

## 2020-11-09 DIAGNOSIS — Z0000 Encounter for general adult medical examination without abnormal findings: Secondary | ICD-10-CM | POA: Diagnosis not present

## 2020-11-09 DIAGNOSIS — Z114 Encounter for screening for human immunodeficiency virus [HIV]: Secondary | ICD-10-CM

## 2020-11-09 DIAGNOSIS — R238 Other skin changes: Secondary | ICD-10-CM

## 2020-11-09 DIAGNOSIS — E785 Hyperlipidemia, unspecified: Secondary | ICD-10-CM

## 2020-11-09 DIAGNOSIS — K580 Irritable bowel syndrome with diarrhea: Secondary | ICD-10-CM

## 2020-11-09 DIAGNOSIS — Z113 Encounter for screening for infections with a predominantly sexual mode of transmission: Secondary | ICD-10-CM

## 2020-11-09 NOTE — Patient Instructions (Addendum)
Health Maintenance Due  Topic Date Due   Pneumococcal Vaccine-you are eligible for this due to asthma 11/02/2013   INFLUENZA VACCINE-today 08/06/2020    Schedule a lab visit at the check out desk for early next week. Return for future fasting labs meaning nothing but water after midnight please. Ok to take your medications with water.    Recommended follow up: Return in about 1 year (around 11/09/2021) for physical or sooner if needed.

## 2020-11-13 ENCOUNTER — Other Ambulatory Visit (INDEPENDENT_AMBULATORY_CARE_PROVIDER_SITE_OTHER): Payer: 59

## 2020-11-13 ENCOUNTER — Other Ambulatory Visit: Payer: Self-pay

## 2020-11-13 DIAGNOSIS — E785 Hyperlipidemia, unspecified: Secondary | ICD-10-CM | POA: Diagnosis not present

## 2020-11-13 DIAGNOSIS — Z114 Encounter for screening for human immunodeficiency virus [HIV]: Secondary | ICD-10-CM

## 2020-11-13 DIAGNOSIS — R238 Other skin changes: Secondary | ICD-10-CM

## 2020-11-13 DIAGNOSIS — Z113 Encounter for screening for infections with a predominantly sexual mode of transmission: Secondary | ICD-10-CM

## 2020-11-13 LAB — CBC WITH DIFFERENTIAL/PLATELET
Basophils Absolute: 0 10*3/uL (ref 0.0–0.1)
Basophils Relative: 0.6 % (ref 0.0–3.0)
Eosinophils Absolute: 0.2 10*3/uL (ref 0.0–0.7)
Eosinophils Relative: 3.1 % (ref 0.0–5.0)
HCT: 45 % (ref 39.0–52.0)
Hemoglobin: 15 g/dL (ref 13.0–17.0)
Lymphocytes Relative: 32.6 % (ref 12.0–46.0)
Lymphs Abs: 2.1 10*3/uL (ref 0.7–4.0)
MCHC: 33.4 g/dL (ref 30.0–36.0)
MCV: 90.8 fl (ref 78.0–100.0)
Monocytes Absolute: 0.5 10*3/uL (ref 0.1–1.0)
Monocytes Relative: 7.6 % (ref 3.0–12.0)
Neutro Abs: 3.7 10*3/uL (ref 1.4–7.7)
Neutrophils Relative %: 56.1 % (ref 43.0–77.0)
Platelets: 195 10*3/uL (ref 150.0–400.0)
RBC: 4.96 Mil/uL (ref 4.22–5.81)
RDW: 12.7 % (ref 11.5–15.5)
WBC: 6.6 10*3/uL (ref 4.0–10.5)

## 2020-11-13 LAB — COMPREHENSIVE METABOLIC PANEL
ALT: 24 U/L (ref 0–53)
AST: 17 U/L (ref 0–37)
Albumin: 4.2 g/dL (ref 3.5–5.2)
Alkaline Phosphatase: 78 U/L (ref 39–117)
BUN: 16 mg/dL (ref 6–23)
CO2: 27 mEq/L (ref 19–32)
Calcium: 9 mg/dL (ref 8.4–10.5)
Chloride: 103 mEq/L (ref 96–112)
Creatinine, Ser: 0.94 mg/dL (ref 0.40–1.50)
GFR: 102.96 mL/min (ref >60.00)
Glucose, Bld: 86 mg/dL (ref 70–99)
Potassium: 4.3 mEq/L (ref 3.5–5.1)
Sodium: 139 mEq/L (ref 135–145)
Total Bilirubin: 0.5 mg/dL (ref 0.2–1.2)
Total Protein: 6.6 g/dL (ref 6.0–8.3)

## 2020-11-13 LAB — LIPID PANEL
Cholesterol: 201 mg/dL — ABNORMAL HIGH (ref 0–200)
HDL: 39.7 mg/dL (ref >39.00)
LDL Cholesterol: 124 mg/dL — ABNORMAL HIGH (ref 0–99)
NonHDL: 160.98
Total CHOL/HDL Ratio: 5
Triglycerides: 184 mg/dL — ABNORMAL HIGH (ref 0.0–149.0)
VLDL: 36.8 mg/dL (ref 0.0–40.0)

## 2020-11-14 ENCOUNTER — Encounter: Payer: Self-pay | Admitting: Family Medicine

## 2020-11-14 LAB — HSV DNA BY PCR (REFERENCE LAB)
HSV 2 DNA: NEGATIVE
HSV-1 DNA: POSITIVE — AB

## 2020-11-15 ENCOUNTER — Ambulatory Visit (INDEPENDENT_AMBULATORY_CARE_PROVIDER_SITE_OTHER): Payer: 59 | Admitting: Family Medicine

## 2020-11-15 ENCOUNTER — Encounter: Payer: Self-pay | Admitting: Family Medicine

## 2020-11-15 ENCOUNTER — Other Ambulatory Visit: Payer: Self-pay

## 2020-11-15 VITALS — BP 118/74 | HR 73 | Temp 98.2°F | Ht 73.0 in | Wt 218.4 lb

## 2020-11-15 DIAGNOSIS — B009 Herpesviral infection, unspecified: Secondary | ICD-10-CM

## 2020-11-15 DIAGNOSIS — E785 Hyperlipidemia, unspecified: Secondary | ICD-10-CM

## 2020-11-15 DIAGNOSIS — M25552 Pain in left hip: Secondary | ICD-10-CM | POA: Diagnosis not present

## 2020-11-15 MED ORDER — VALACYCLOVIR HCL 1 G PO TABS: 1000.0000 mg | ORAL_TABLET | Freq: Every day | ORAL | 1 refills | Status: DC

## 2020-11-15 NOTE — Patient Instructions (Addendum)
Valtrex as needed for future illness- take at first sign and for 5 days.   Keep track of recurrence and if very bothersome we can consider daily suppressive therapy  Recommended follow up: move November physical up to 6 months from now and we can recheck lipids -schedule labs a few days before visit with labs that I ordered today

## 2020-11-15 NOTE — Progress Notes (Signed)
Phone [phone] In person visit   Subjective:   Tony [name] is a 40 y.o. year old very pleasant male patient who presents for/with See problem oriented charting Chief Complaint  Patient presents with   Follow-up    Discuss labs    This visit occurred during the SARS-CoV-2 public health emergency.  Safety protocols were in place, including screening questions prior to the visit, additional usage of staff PPE, and extensive cleaning of exam room while observing appropriate contact time as indicated for disinfecting solutions.   Past Medical History-  Patient Active Problem List   Diagnosis Date Noted   Dyslipidemia 09/17/2009    Priority: Medium    Allergic rhinitis 09/17/2009    Priority: Medium    Asthma 09/17/2009    Priority: Medium    IBS-D 10/11/2015    Priority: Low   Labral tear of right hip joint 01/28/2018   Right hip pain 10/13/2017   Quadriceps tendinitis 11/06/2015   SI (sacroiliac) joint dysfunction 11/06/2015   Nonallopathic lesion of sacral region 11/06/2015   Nonallopathic lesion of lumbosacral region 11/06/2015   Nonallopathic lesion of thoracic region 11/06/2015    Medications- reviewed and updated Current Outpatient Medications  Medication Sig Dispense Refill   albuterol (PROVENTIL HFA;VENTOLIN HFA) 108 (90 Base) MCG/ACT inhaler Take 2 puffs by mouth every 6 hours as needed 18 Inhaler 0   Ascorbic Acid (VITAMIN C) 1000 MG tablet Take 1,000 mg by mouth daily.     Cholecalciferol (VITAMIN D3) 50 MCG (2000 UT) TABS Take 1 tablet by mouth daily.     DOXYCYCLINE PO Take by mouth 2 (two) times daily.     EPINEPHrine 0.3 mg/0.3 mL IJ SOAJ injection as directed.     fexofenadine (ALLEGRA) 180 MG tablet Take 180 mg by mouth daily.     fluticasone (FLONASE) 50 MCG/ACT nasal spray 2 sprays by Nasal route daily.       montelukast (SINGULAIR) 10 MG tablet TAKE 1 TABLET IN THE EVENING ONCE A DAY  5   Multiple Vitamin (MULTI-VITAMIN) tablet Take 1 tablet by  mouth daily.     Olopatadine HCl 0.6 % SOLN by Nasal route. 2 sprays to each nostril daily      QUERCETIN PO Take 500 mg by mouth daily.     valACYclovir (VALTREX) 1000 MG tablet Take 1 tablet (1,000 mg total) by mouth daily. Take for 5 days at first sign of illness. 30 tablet 1   zinc gluconate 50 MG tablet Take 50 mg by mouth daily.     No current facility-administered medications for this visit.     Objective:  BP 118/74   Pulse 73   Temp 98.2 F (36.8 C)   Ht 6\' 1"  (1.854 m)   Wt 218 lb 6.4 oz (99.1 kg)   SpO2 97%   BMI 28.81 kg/m  Gen: NAD, resting comfortably     Assessment and Plan    #Genital herpes S: Patient with multiple erythematous areas in his groin including vesicles last visit-unroofed 1 of these and positive for HSV 1 and also had IgG antibodies for HSV 1  And reflecting back patient reports lesions actually were more painful than he originally suggested-they are much improved at this point A/P: Patient has done some good research on genital herpes and we had a good conversation today about transmission, suppression, intermittent treatment-all questions answered.   -Ultimately he opted for intermittent treatment-Valtrex prescription was provided and he preferred once daily for 5 days - Asked  him to keep a log of treatments-can consider suppressive in the future if needed   #hyperlipidemia #Family history of early MI Father age 74- alcoholic and abusing prescription drugs and sedentary  S: Medication:strongly prefers to work on lifestyle over starting medications-has had a setback recently related to separation - Last visit discussed considering baseline coronary calcium scoring perhaps age 76-today we discussed could even do a younger age if he preferred Lab Results  Component Value Date   CHOL 201 (H) 11/13/2020   HDL 39.70 11/13/2020   LDLCALC 124 (H) 11/13/2020   LDLDIRECT 153.0 08/12/2017   TRIG 184.0 (H) 11/13/2020   CHOLHDL 5 11/13/2020   A/P: In  regards to hyperlipidemia prefers 6 month CPE (has [employer]) and repeat lipids.  He would like to hold off on medication for now-I do not see a strong indication for medication anyway.  He also wants to hold off on coronary artery calcium scoring  #Left hip pain after prior surgery-he was told he would need replacement in 15 years on bilateral hips.  He has been having some ongoing pain and I encouraged follow-up with his orthopedist  Recommended follow up: move November physical up to 6 months from now and we can recheck lipids -schedule labs a few days before visit with labs that I ordered today Future Appointments  Date Time Provider Department Center  05/16/2021  8:15 AM [facility]  05/21/2021  3:00 PM Shelva Majestic, MD [facility]    Lab/Order associations:   ICD-10-CM   1. Dyslipidemia  E78.5 CBC with Differential/Platelet    Comprehensive metabolic panel    Lipid panel    2. Left hip pain  M25.552     3. Herpes simplex virus (HSV) infection  B00.9       Meds ordered this encounter  Medications   valACYclovir (VALTREX) 1000 MG tablet    Sig: Take 1 tablet (1,000 mg total) by mouth daily. Take for 5 days at first sign of illness.    Dispense:  30 tablet    Refill:  1    I,Harris Phan,acting as a scribe for Tana Conch, MD.,have documented all relevant documentation on the behalf of Tana Conch, MD,as directed by  Tana Conch, MD while in the presence of Tana Conch, MD.    I, Tana Conch, MD, have reviewed all documentation for this visit. The documentation on 11/15/20 for the exam, diagnosis, procedures, and orders are all accurate and complete.   Return precautions advised.  Tana Conch, MD

## 2020-11-16 LAB — HSV 1 IGM, IFA (REFLEX): HSV 1 IGM TITER: 1:20 {titer} — AB

## 2020-11-16 LAB — HSV 1/2 AB (IGM), IFA W/RFLX TITER
HSV 1 IgM Screen: POSITIVE — AB
HSV 2 IgM Screen: NEGATIVE

## 2020-11-16 LAB — HIV ANTIBODY (ROUTINE TESTING W REFLEX): HIV 1&2 Ab, 4th Generation: NONREACTIVE

## 2020-11-16 LAB — HSV(HERPES SIMPLEX VRS) I + II AB-IGG
HAV 1 IGG,TYPE SPECIFIC AB: 11.8 index — ABNORMAL HIGH
HSV 2 IGG,TYPE SPECIFIC AB: <0.9 index

## 2020-11-16 LAB — RPR: RPR Ser Ql: NONREACTIVE

## 2020-12-07 ENCOUNTER — Other Ambulatory Visit: Payer: Self-pay | Admitting: Family Medicine

## 2020-12-26 ENCOUNTER — Other Ambulatory Visit: Payer: Self-pay | Admitting: Family Medicine

## 2021-01-22 ENCOUNTER — Encounter (HOSPITAL_BASED_OUTPATIENT_CLINIC_OR_DEPARTMENT_OTHER): Payer: Self-pay | Admitting: Emergency Medicine

## 2021-01-22 ENCOUNTER — Ambulatory Visit: Payer: 59 | Admitting: Sports Medicine

## 2021-01-22 ENCOUNTER — Other Ambulatory Visit: Payer: Self-pay

## 2021-01-22 ENCOUNTER — Emergency Department (HOSPITAL_BASED_OUTPATIENT_CLINIC_OR_DEPARTMENT_OTHER)
Admission: EM | Admit: 2021-01-22 | Discharge: 2021-01-22 | Disposition: A | Discharge status: Home / Self Care | Payer: 59 | Attending: Emergency Medicine | Admitting: Emergency Medicine

## 2021-01-22 DIAGNOSIS — M545 Low back pain, unspecified: Secondary | ICD-10-CM | POA: Diagnosis present

## 2021-01-22 DIAGNOSIS — Z79899 Other long term (current) drug therapy: Secondary | ICD-10-CM | POA: Insufficient documentation

## 2021-01-22 MED ORDER — MORPHINE SULFATE (PF) 4 MG/ML IV SOLN
6.0000 mg | Freq: Once | INTRAVENOUS | Status: AC
  Administered 2021-01-22: 6 mg via INTRAMUSCULAR
  Filled 2021-01-22: qty 2

## 2021-01-22 MED ORDER — CYCLOBENZAPRINE HCL 10 MG PO TABS: 10.0000 mg | ORAL_TABLET | Freq: Two times a day (BID) | ORAL | 0 refills | Status: DC | PRN

## 2021-01-22 MED ORDER — METHYLPREDNISOLONE 4 MG PO TBPK: ORAL_TABLET | ORAL | 0 refills | Status: DC

## 2021-01-22 NOTE — ED Notes (Signed)
Patient verbalizes understanding of discharge instructions. Opportunity for questioning and answers were provided. Patient discharged from ED.  °

## 2021-01-22 NOTE — ED Triage Notes (Signed)
Pt arrives to ED with c/o left lower back pain. This started at the gym this morning. This started after pt was doing a [employer] workout.

## 2021-01-22 NOTE — Progress Notes (Signed)
Tony [name] D.[place] Phone: [phone]   Assessment and Plan:     1. Chronic left SI joint pain 2. Somatic dysfunction of lumbar region 3. Somatic dysfunction of pelvic region 4. Somatic dysfunction of sacral region -Chronic with exacerbation, initial sports medicine visit - History of chronic left SI joint pain with acute exacerbation yesterday leading to SI joint dysfunction and lumbar muscle strain - No red flag symptoms, so no imaging taken today - Discontinue prednisone due to side effect profile - Start meloxicam 15 mg daily x2 weeks and may use remainder as needed for pain control.  Instructed to not use additional NSAIDs while using meloxicam - Use Tylenol for breakthrough pain - Start HEP for back stretching  - Patient has received significant relief with OMT in the past.  Elects for repeat OMT today.  Tolerated well per note below. - Decision today to treat with OMT was based on Physical Exam  After verbal consent patient was treated with HVLA (high velocity low amplitude), ME (muscle energy), FPR (flex positional release), ST (soft tissue), PC/PD (Pelvic Compression/ Pelvic Decompression) techniques in sacrum, lumbar, and pelvic areas. Patient tolerated the procedure well with improvement in symptoms.  Patient educated on potential side effects of soreness and recommended to rest, hydrate, and use Tylenol as needed for pain control.   Pertinent previous records reviewed include ER note from 01/22/2021   Follow Up: as needed in 2 weeks if no improvement     Subjective:   I, Moenique Parris, am serving as a Education administrator for Doctor Glennon Mac  Chief Complaint: acute back pain   HPI:   01/23/2021 Patient is a 41 year old male complaining of back pain. Patient states He says he was lifting kettle bells yesterday morning and felt a sudden pull in his left lower back.  He has had ongoing pain in  his left lower back since that time.  Its worse with movement and standing and trying to turn.  He denies any other injuries.  No radiation down his legs.  No numbness or weakness to his legs. Ed gave him muscle relaxer,   Relevant Historical Information: History of bilateral hip labrum repairs  Additional pertinent review of systems negative.   Current Outpatient Medications:    meloxicam (MOBIC) 15 MG tablet, Take 1 tablet (15 mg total) by mouth daily., Disp: 30 tablet, Rfl: 0   albuterol (PROVENTIL HFA;VENTOLIN HFA) 108 (90 Base) MCG/ACT inhaler, Take 2 puffs by mouth every 6 hours as needed, Disp: 18 Inhaler, Rfl: 0   Ascorbic Acid (VITAMIN C) 1000 MG tablet, Take 1,000 mg by mouth daily., Disp: , Rfl:    Cholecalciferol (VITAMIN D3) 50 MCG (2000 UT) TABS, Take 1 tablet by mouth daily., Disp: , Rfl:    cyclobenzaprine (FLEXERIL) 10 MG tablet, Take 1 tablet (10 mg total) by mouth 2 (two) times daily as needed for muscle spasms., Disp: 20 tablet, Rfl: 0   DOXYCYCLINE PO, Take by mouth 2 (two) times daily., Disp: , Rfl:    EPINEPHrine 0.3 mg/0.3 mL IJ SOAJ injection, as directed., Disp: , Rfl:    fexofenadine (ALLEGRA) 180 MG tablet, Take 180 mg by mouth daily., Disp: , Rfl:    fluticasone (FLONASE) 50 MCG/ACT nasal spray, 2 sprays by Nasal route daily.  , Disp: , Rfl:    methylPREDNISolone (MEDROL DOSEPAK) 4 MG TBPK tablet, Take as directed, Disp: 1 each, Rfl: 0  montelukast (SINGULAIR) 10 MG tablet, TAKE 1 TABLET IN THE EVENING ONCE A DAY, Disp: , Rfl: 5   Multiple Vitamin (MULTI-VITAMIN) tablet, Take 1 tablet by mouth daily., Disp: , Rfl:    Olopatadine HCl 0.6 % SOLN, by Nasal route. 2 sprays to each nostril daily , Disp: , Rfl:    QUERCETIN PO, Take 500 mg by mouth daily., Disp: , Rfl:    valACYclovir (VALTREX) 1000 MG tablet, TAKE 1 TABLET (1,000 MG TOTAL) BY MOUTH DAILY. TAKE FOR 5 DAYS AT FIRST SIGN OF ILLNESS., Disp: 90 tablet, Rfl: 1   zinc gluconate 50 MG tablet, Take 50 mg by  mouth daily., Disp: , Rfl:    Objective:     Vitals:   01/23/21 0754  BP: 134/80  Pulse: 93  SpO2: 98%  Weight: 220 lb (99.8 kg)  Height: 6\' 1"  (1.854 m)      Body mass index is 29.03 kg/m.    Physical Exam:    General: Well-appearing, cooperative, sitting comfortably in no acute distress.   OMT Physical Exam:  ASIS Compression Test: Positive left Sacrum: Positive sphinx, TTP left sacral base Lumbar: TTP paraspinal on lower left, L2-4 RLSR Pelvis: Left anterior innominate  Gen: Appears well, nad, nontoxic and pleasant Psych: Alert and oriented, appropriate mood and affect Neuro: sensation intact, strength is 5/5 in upper and lower extremities, muscle tone wnl Skin: no susupicious lesions or rashes  Back - Normal skin, Spine with normal alignment and no deformity.   No tenderness to vertebral process palpation.   Paraspinous muscles are TTP left-sided L4-L5, and otherwise not tender and without spasm Straight leg raise negative, though bilateral leg raises cause pain localized over left SI joint Trendelenberg negative    Electronically signed by:  Tony [name] D.[facility] 8:23 AM 01/23/21

## 2021-01-22 NOTE — ED Provider Notes (Signed)
[facility] EMERGENCY DEPT Provider Note   CSN: [id number] Arrival date & time: 01/22/21  0753     History  Chief Complaint  Patient presents with   Back Pain    Tony [name] is a 41 y.o. male.  Patient is a 41 year old male who presents with back pain.  He says he was lifting kettle bells this morning and felt a sudden pull in his left lower back.  He has had ongoing pain in his left lower back since that time.  Its worse with movement and standing.  He denies any other injuries.  No radiation down his legs.  No numbness or weakness to his legs.  No loss of bowel or bladder control.  He has had prior labrum repairs to his hips.  He is followed by Dr. Case with orthopedics.      Home Medications Prior to Admission medications   Medication Sig Start Date End Date Taking? Authorizing Provider  cyclobenzaprine (FLEXERIL) 10 MG tablet Take 1 tablet (10 mg total) by mouth 2 (two) times daily as needed for muscle spasms. 01/22/21  Yes Rolan Bucco, MD  methylPREDNISolone (MEDROL DOSEPAK) 4 MG TBPK tablet Take as directed 01/22/21  Yes Rolan Bucco, MD  albuterol (PROVENTIL HFA;VENTOLIN HFA) 108 (90 Base) MCG/ACT inhaler Take 2 puffs by mouth every 6 hours as needed 09/25/16   Terressa Koyanagi, DO  Ascorbic Acid (VITAMIN C) 1000 MG tablet Take 1,000 mg by mouth daily.    [provider]  Cholecalciferol (VITAMIN D3) 50 MCG (2000 UT) TABS Take 1 tablet by mouth daily.    [provider]  DOXYCYCLINE PO Take by mouth 2 (two) times daily.    [provider]  EPINEPHrine 0.3 mg/0.3 mL IJ SOAJ injection as directed. 09/18/15   [provider]  fexofenadine (ALLEGRA) 180 MG tablet Take 180 mg by mouth daily.    [provider]  fluticasone (FLONASE) 50 MCG/ACT nasal spray 2 sprays by Nasal route daily.      [provider]  montelukast (SINGULAIR) 10 MG tablet TAKE 1 TABLET IN THE EVENING ONCE A DAY 03/08/15   [provider]  Multiple Vitamin (MULTI-VITAMIN) tablet Take 1 tablet by mouth daily.    [provider]  Olopatadine HCl 0.6 % SOLN by Nasal route. 2 sprays to each nostril daily     [provider]  QUERCETIN PO Take 500 mg by mouth daily.    [provider]  valACYclovir (VALTREX) 1000 MG tablet TAKE 1 TABLET (1,000 MG TOTAL) BY MOUTH DAILY. TAKE FOR 5 DAYS AT FIRST SIGN OF ILLNESS. 12/27/20   Shelva Majestic, MD  zinc gluconate 50 MG tablet Take 50 mg by mouth daily.    [provider]      Allergies    Prednisolone    Review of Systems   Review of Systems  Physical Exam Updated Vital Signs BP (!) 137/97 (BP Location: Left Arm)    Pulse 86    Temp 98.2 F (36.8 C)    Resp 14    Ht 6\' 1"  (1.854 m)    Wt 95.3 kg    SpO2 100%    BMI 27.71 kg/m  Physical Exam  ED Results / Procedures / Treatments   Labs (all labs ordered are listed, but only abnormal results are displayed) Labs Reviewed - No data to display  EKG None  Radiology No results found.  Procedures Procedures    Medications Ordered in ED  Medications  morphine 4 MG/ML injection 6 mg (6 mg Intramuscular Given 01/22/21 6789)    ED Course/ Medical Decision Making/ A&P                           Medical Decision Making Risk Prescription drug management.   Patient is a 41 year old male who presents with left-sided back pain.  He has no radicular symptoms.  No suggestions of cauda equina.  No red flags that would warrant imaging.  No associated abdominal pain which would suggest more of an abdominal etiology.  It sounds musculoskeletal in nature.  He is neurologically intact.  He was discharged home in good condition.  He was given a shot of morphine in the ED for symptomatic control.  He says that he cannot take anything with prednisone but he has taken methylprednisolone before without difficulty.  He says prednisone gives him hiccups.  He was started on a Medrol Dosepak and  given a prescription for Flexeril.  He was advised in other modalities such as Lidoderm patches, Voltaren gel, etc.  He was advised not to use ibuprofen while he is on the prednisone but can started after.  He will follow-up with his orthopedic surgeon.  Return precautions given.  Final Clinical Impression(s) / ED Diagnoses Final diagnoses:  Acute left-sided low back pain without sciatica    Rx / DC Orders ED Discharge Orders          Ordered    methylPREDNISolone (MEDROL DOSEPAK) 4 MG TBPK tablet        01/22/21 0831    cyclobenzaprine (FLEXERIL) 10 MG tablet  2 times daily PRN        01/22/21 0847              Rolan Bucco, MD 01/22/21 3810

## 2021-01-22 NOTE — Progress Notes (Unsigned)
° °   [name] D.[facility] [address] [place] [address] Phone: [phone]   Assessment and Plan:     There are no diagnoses linked to this encounter.  ***   Pertinent previous records reviewed include ***   Follow Up: ***     Subjective:   I, Ronae Noell, am serving as a Neurosurgeon for Doctor Richardean Sale  Chief Complaint: left sided back pain   HPI:   01/22/21 Patient is a 41 year old male complaining of left sided back pain. Patient states   He says he was lifting kettle bells this morning and felt a sudden pull in his left lower back.  He has had ongoing pain in his left lower back since that time.  Its worse with movement and standing.  He denies any other injuries.  No radiation down his legs.  No numbness or weakness to his legs.    Relevant Historical Information: ***  Additional pertinent review of systems negative.   Current Outpatient Medications:    albuterol (PROVENTIL HFA;VENTOLIN HFA) 108 (90 Base) MCG/ACT inhaler, Take 2 puffs by mouth every 6 hours as needed, Disp: 18 Inhaler, Rfl: 0   Ascorbic Acid (VITAMIN C) 1000 MG tablet, Take 1,000 mg by mouth daily., Disp: , Rfl:    Cholecalciferol (VITAMIN D3) 50 MCG (2000 UT) TABS, Take 1 tablet by mouth daily., Disp: , Rfl:    cyclobenzaprine (FLEXERIL) 10 MG tablet, Take 1 tablet (10 mg total) by mouth 2 (two) times daily as needed for muscle spasms., Disp: 20 tablet, Rfl: 0   DOXYCYCLINE PO, Take by mouth 2 (two) times daily., Disp: , Rfl:    EPINEPHrine 0.3 mg/0.3 mL IJ SOAJ injection, as directed., Disp: , Rfl:    fexofenadine (ALLEGRA) 180 MG tablet, Take 180 mg by mouth daily., Disp: , Rfl:    fluticasone (FLONASE) 50 MCG/ACT nasal spray, 2 sprays by Nasal route daily.  , Disp: , Rfl:    methylPREDNISolone (MEDROL DOSEPAK) 4 MG TBPK tablet, Take as directed, Disp: 1 each, Rfl: 0   montelukast (SINGULAIR) 10 MG tablet, TAKE 1 TABLET IN THE EVENING ONCE A DAY,  Disp: , Rfl: 5   Multiple Vitamin (MULTI-VITAMIN) tablet, Take 1 tablet by mouth daily., Disp: , Rfl:    Olopatadine HCl 0.6 % SOLN, by Nasal route. 2 sprays to each nostril daily , Disp: , Rfl:    QUERCETIN PO, Take 500 mg by mouth daily., Disp: , Rfl:    valACYclovir (VALTREX) 1000 MG tablet, TAKE 1 TABLET (1,000 MG TOTAL) BY MOUTH DAILY. TAKE FOR 5 DAYS AT FIRST SIGN OF ILLNESS., Disp: 90 tablet, Rfl: 1   zinc gluconate 50 MG tablet, Take 50 mg by mouth daily., Disp: , Rfl:    Objective:     There were no vitals filed for this visit.    There is no height or weight on file to calculate BMI.    Physical Exam:    ***   Electronically signed by:  [name] D.[facility] 9:11 AM 01/22/21

## 2021-01-23 ENCOUNTER — Ambulatory Visit: Payer: 59 | Admitting: Sports Medicine

## 2021-01-23 ENCOUNTER — Ambulatory Visit: Payer: 59 | Admitting: Family Medicine

## 2021-01-23 VITALS — BP 134/80 | HR 93 | Ht 73.0 in | Wt 220.0 lb

## 2021-01-23 DIAGNOSIS — M9903 Segmental and somatic dysfunction of lumbar region: Secondary | ICD-10-CM

## 2021-01-23 DIAGNOSIS — M533 Sacrococcygeal disorders, not elsewhere classified: Secondary | ICD-10-CM | POA: Diagnosis not present

## 2021-01-23 DIAGNOSIS — M9904 Segmental and somatic dysfunction of sacral region: Secondary | ICD-10-CM | POA: Diagnosis not present

## 2021-01-23 DIAGNOSIS — M9905 Segmental and somatic dysfunction of pelvic region: Secondary | ICD-10-CM | POA: Diagnosis not present

## 2021-01-23 DIAGNOSIS — G8929 Other chronic pain: Secondary | ICD-10-CM | POA: Diagnosis not present

## 2021-01-23 MED ORDER — MELOXICAM 15 MG PO TABS: 15.0000 mg | ORAL_TABLET | Freq: Every day | ORAL | 0 refills | Status: DC

## 2021-01-23 NOTE — Patient Instructions (Addendum)
Good to see you  Meloxicam 15mg  daily for 2 weeks remainder as needed Low back HEP As needed follow up in 2 weeks

## 2021-02-06 NOTE — Progress Notes (Signed)
Tony [name] [name] [address] [place] [address] Phone: [phone]   Assessment and Plan:     1. Chronic left SI joint pain 2. Somatic dysfunction of lumbar region 3. Somatic dysfunction of pelvic region 4. Somatic dysfunction of sacral region -Chronic with exacerbation, subsequent visit - Recurrence of left SI joint pain likely due to prolonged sitting and car ride - Discontinue meloxicam.  Patient has been using OTC NSAIDs as needed for pain control - Patient has received significant relief with OMT in the past.  Elects for repeat OMT today.  Tolerated well per note below. - Decision today to treat with OMT was based on Physical Exam   After verbal consent patient was treated with HVLA (high velocity low amplitude), ME (muscle energy), FPR (flex positional release), ST (soft tissue), PC/PD (Pelvic Compression/ Pelvic Decompression) techniques in cervical, rib, thoracic, lumbar, and pelvic areas. Patient tolerated the procedure well with improvement in symptoms.  Patient educated on potential side effects of soreness and recommended to rest, hydrate, and use Tylenol as needed for pain control.   Pertinent previous records reviewed include none   Follow Up: 4 weeks for repeat maintenance OMT targeting left SI   Subjective:   I, Tony [name], am serving as a Neurosurgeon for Tony [name]  Chief Complaint: acute back pain   HPI:  01/23/2021 Patient is a 41 year old male complaining of back pain. Patient states He says he was lifting kettle bells yesterday morning and felt a sudden pull in his left lower back.  He has had ongoing pain in his left lower back since that time.  Its worse with movement and standing and trying to turn.  He denies any other injuries.  No radiation down his legs.  No numbness or weakness to his legs. Ed gave him muscle relaxer,    02/07/2021 Patient states that his back has been hurting some , went to [place]  and thinks sitting and standing so much the SI area is just tight     Relevant Historical Information: History of bilateral hip labrum repairs  Additional pertinent review of systems negative.  Current Outpatient Medications  Medication Sig Dispense Refill   albuterol (PROVENTIL HFA;VENTOLIN HFA) 108 (90 Base) MCG/ACT inhaler Take 2 puffs by mouth every 6 hours as needed 18 Inhaler 0   Ascorbic Acid (VITAMIN C) 1000 MG tablet Take 1,000 mg by mouth daily.     Cholecalciferol (VITAMIN D3) 50 MCG (2000 UT) TABS Take 1 tablet by mouth daily.     cyclobenzaprine (FLEXERIL) 10 MG tablet Take 1 tablet (10 mg total) by mouth 2 (two) times daily as needed for muscle spasms. 20 tablet 0   DOXYCYCLINE PO Take by mouth 2 (two) times daily.     EPINEPHrine 0.3 mg/0.3 mL IJ SOAJ injection as directed.     fexofenadine (ALLEGRA) 180 MG tablet Take 180 mg by mouth daily.     fluticasone (FLONASE) 50 MCG/ACT nasal spray 2 sprays by Nasal route daily.       meloxicam (MOBIC) 15 MG tablet Take 1 tablet (15 mg total) by mouth daily. 30 tablet 0   methylPREDNISolone (MEDROL DOSEPAK) 4 MG TBPK tablet Take as directed 1 each 0   montelukast (SINGULAIR) 10 MG tablet TAKE 1 TABLET IN THE EVENING ONCE A DAY  5   Multiple Vitamin (MULTI-VITAMIN) tablet Take 1 tablet by mouth daily.     Olopatadine HCl 0.6 % SOLN by Nasal  route. 2 sprays to each nostril daily      QUERCETIN PO Take 500 mg by mouth daily.     valACYclovir (VALTREX) 1000 MG tablet TAKE 1 TABLET (1,000 MG TOTAL) BY MOUTH DAILY. TAKE FOR 5 DAYS AT FIRST SIGN OF ILLNESS. 90 tablet 1   zinc gluconate 50 MG tablet Take 50 mg by mouth daily.     No current facility-administered medications for this visit.      Objective:     Vitals:   02/07/21 0840  BP: 118/80  Pulse: (!) 107  SpO2: 98%  Weight: 223 lb (101.2 kg)  Height: 6\' 1"  (1.854 m)      Body mass index is 29.42 kg/m.    Physical Exam:     General: Well-appearing, cooperative,  sitting comfortably in no acute distress.   OMT Physical Exam:  ASIS Compression Test: Positive left   Sacrum: Positive sphinx, TTP left sacral base Lumbar: TTP paraspinal, L1-3 RRSL Pelvis: Left anterior innominate  Electronically signed by:  D.[name] 9:00 AM 02/07/21

## 2021-02-07 ENCOUNTER — Other Ambulatory Visit: Payer: Self-pay

## 2021-02-07 ENCOUNTER — Ambulatory Visit: Payer: 59 | Admitting: Sports Medicine

## 2021-02-07 VITALS — BP 118/80 | HR 107 | Ht 73.0 in | Wt 223.0 lb

## 2021-02-07 DIAGNOSIS — M9905 Segmental and somatic dysfunction of pelvic region: Secondary | ICD-10-CM | POA: Diagnosis not present

## 2021-02-07 DIAGNOSIS — M9903 Segmental and somatic dysfunction of lumbar region: Secondary | ICD-10-CM | POA: Diagnosis not present

## 2021-02-07 DIAGNOSIS — M533 Sacrococcygeal disorders, not elsewhere classified: Secondary | ICD-10-CM | POA: Diagnosis not present

## 2021-02-07 DIAGNOSIS — M9904 Segmental and somatic dysfunction of sacral region: Secondary | ICD-10-CM | POA: Diagnosis not present

## 2021-02-07 DIAGNOSIS — G8929 Other chronic pain: Secondary | ICD-10-CM

## 2021-02-07 NOTE — Patient Instructions (Addendum)
Good to see you  4 week follow up   

## 2021-03-07 ENCOUNTER — Ambulatory Visit: Payer: 59 | Admitting: Sports Medicine

## 2021-03-07 NOTE — Progress Notes (Signed)
? [name] D.[name] ?[facility] ?[address] [place] [address] ?Phone: [phone] ?  ?Assessment and Plan:   ?  ?1. Chronic left SI joint pain ?2. Somatic dysfunction of lumbar region ?3. Somatic dysfunction of pelvic region ?4. Somatic dysfunction of sacral region ?-Chronic with exacerbation, subsequent visit ?- Recurrence of multiple musculoskeletal complaints with most prominent being left SI joint, though overall improved with regular OMT visits ?- Patient has received significant relief with OMT in the past.  Elects for repeat OMT today.  Tolerated well per note below. ?- Decision today to treat with OMT was based on Physical Exam ?  ?After verbal consent patient was treated with HVLA (high velocity low amplitude), ME (muscle energy), FPR (flex positional release), ST (soft tissue), PC/PD (Pelvic Compression/ Pelvic Decompression) techniques in sacrum, thoracic, lumbar, and pelvic areas. Patient tolerated the procedure well with improvement in symptoms.  Patient educated on potential side effects of soreness and recommended to rest, hydrate, and use Tylenol as needed for pain control. ?  ?Pertinent previous records reviewed include none ?  ?Follow Up: 3 to 4 weeks for repeat OMT maintenance ?  ?Subjective:   ?I, Jerene Canny, am serving as a Neurosurgeon for [employer] ? ?Chief Complaint: OMT follow up  ? ?HPI:  ?01/23/2021 ?Patient is a 41 year old male complaining of back pain. Patient states He says he was lifting kettle bells yesterday morning and felt a sudden pull in his left lower back.  He has had ongoing pain in his left lower back since that time.  Its worse with movement and standing and trying to turn.  He denies any other injuries.  No radiation down his legs.  No numbness or weakness to his legs. Ed gave him muscle relaxer,  ?  ?02/07/2021 ?Patient states that his back has been hurting some , went to [facility] and thinks sitting and standing so much the SI area  is just tight  ?  ? 03/08/2021 ?Patient states doing the same  ? ? ?  ?Relevant Historical Information: History of bilateral hip labrum repairs ? ?Additional pertinent review of systems negative. ? ?Current Outpatient Medications  ?Medication Sig Dispense Refill  ? albuterol (PROVENTIL HFA;VENTOLIN HFA) 108 (90 Base) MCG/ACT inhaler Take 2 puffs by mouth every 6 hours as needed 18 Inhaler 0  ? Ascorbic Acid (VITAMIN C) 1000 MG tablet Take 1,000 mg by mouth daily.    ? Cholecalciferol (VITAMIN D3) 50 MCG (2000 UT) TABS Take 1 tablet by mouth daily.    ? cyclobenzaprine (FLEXERIL) 10 MG tablet Take 1 tablet (10 mg total) by mouth 2 (two) times daily as needed for muscle spasms. 20 tablet 0  ? DOXYCYCLINE PO Take by mouth 2 (two) times daily.    ? EPINEPHrine 0.3 mg/0.3 mL IJ SOAJ injection as directed.    ? fexofenadine (ALLEGRA) 180 MG tablet Take 180 mg by mouth daily.    ? fluticasone (FLONASE) 50 MCG/ACT nasal spray 2 sprays by Nasal route daily.      ? meloxicam (MOBIC) 15 MG tablet Take 1 tablet (15 mg total) by mouth daily. 30 tablet 0  ? methylPREDNISolone (MEDROL DOSEPAK) 4 MG TBPK tablet Take as directed 1 each 0  ? montelukast (SINGULAIR) 10 MG tablet TAKE 1 TABLET IN THE EVENING ONCE A DAY  5  ? Multiple Vitamin (MULTI-VITAMIN) tablet Take 1 tablet by mouth daily.    ? Olopatadine HCl 0.6 % SOLN by Nasal route.  2 sprays to each nostril daily     ? QUERCETIN PO Take 500 mg by mouth daily.    ? valACYclovir (VALTREX) 1000 MG tablet TAKE 1 TABLET (1,000 MG TOTAL) BY MOUTH DAILY. TAKE FOR 5 DAYS AT FIRST SIGN OF ILLNESS. 90 tablet 1  ? zinc gluconate 50 MG tablet Take 50 mg by mouth daily.    ? ?No current facility-administered medications for this visit.  ?  ?  ?Objective:   ?  ?Vitals:  ? 03/08/21 0902  ?BP: 122/72  ?Pulse: (!) 113  ?SpO2: 98%  ?Weight: 229 lb (103.9 kg)  ?Height: 6\' 1"  (1.854 m)  ?  ?  ?Body mass index is 30.21 kg/m?.  ?  ?Physical Exam:   ?  ?General: Well-appearing, cooperative, sitting  comfortably in no acute distress.  ? ?OMT Physical Exam: ? ?ASIS Compression Test: Positive Right ?Sacrum: Positive sphinx, TTP left sacral base ?Lumbar: TTP paraspinal, L1-3 RLSR ?Pelvis: Right anterior innominate with out flare ? ?Electronically signed by:  ? D.[name] ?[facility] ?9:18 AM 03/08/21 ?

## 2021-03-08 ENCOUNTER — Other Ambulatory Visit: Payer: Self-pay

## 2021-03-08 ENCOUNTER — Ambulatory Visit (INDEPENDENT_AMBULATORY_CARE_PROVIDER_SITE_OTHER): Payer: 59 | Admitting: Sports Medicine

## 2021-03-08 VITALS — BP 122/72 | HR 113 | Ht 73.0 in | Wt 229.0 lb

## 2021-03-08 DIAGNOSIS — M533 Sacrococcygeal disorders, not elsewhere classified: Secondary | ICD-10-CM

## 2021-03-08 DIAGNOSIS — M9903 Segmental and somatic dysfunction of lumbar region: Secondary | ICD-10-CM

## 2021-03-08 DIAGNOSIS — G8929 Other chronic pain: Secondary | ICD-10-CM

## 2021-03-08 DIAGNOSIS — M9905 Segmental and somatic dysfunction of pelvic region: Secondary | ICD-10-CM | POA: Diagnosis not present

## 2021-03-08 DIAGNOSIS — M9904 Segmental and somatic dysfunction of sacral region: Secondary | ICD-10-CM

## 2021-03-08 NOTE — Patient Instructions (Addendum)
Good to see you  ?3-4 week follow up repeat OMT  ?

## 2021-03-18 ENCOUNTER — Encounter: Payer: Self-pay | Admitting: Family Medicine

## 2021-03-18 ENCOUNTER — Ambulatory Visit: Payer: 59 | Admitting: Family Medicine

## 2021-03-18 VITALS — BP 122/84 | HR 74 | Temp 97.6°F | Ht 73.0 in | Wt 226.1 lb

## 2021-03-18 DIAGNOSIS — M25562 Pain in left knee: Secondary | ICD-10-CM

## 2021-03-18 DIAGNOSIS — J0110 Acute frontal sinusitis, unspecified: Secondary | ICD-10-CM | POA: Diagnosis not present

## 2021-03-18 LAB — POC COVID19 BINAXNOW: SARS Coronavirus 2 Ag: NEGATIVE

## 2021-03-18 MED ORDER — CEFDINIR 300 MG PO CAPS: 300.0000 mg | ORAL_CAPSULE | Freq: Two times a day (BID) | ORAL | 0 refills | Status: DC

## 2021-03-18 NOTE — Patient Instructions (Signed)
May want to check covid again tomorrow ? ?Referral sent to Dr. Case ?

## 2021-03-18 NOTE — Progress Notes (Signed)
? ?Subjective:  ? ? ? Patient ID: Tony [name], male    DOB: 01/05/1981, 41 y.o.   MRN: [id number] ? ?Chief Complaint  ?Patient presents with  ? Cyst  ?  Cyst on left knee that is painful ?  ? Sinus Problem  ?  Started a couple of days ago, ear pain   ? ? ?HPI ?Painful lump L knee-medial side for 5-7 yrs.  But starting to hurt past 1 month.  Not worse/better w/movement.  In am, when stretches leg, bad.  Had similar in R hand-removed 20 yrs ago. Sometimes will become more prominent-like a blue blood vessel. ? ?Now, sinus pain past few days. Congestion.  Myalgias. Ear pain-R.  Feels "like crap" ?No sob/cough/f/c ?Has had sinus surgery x 3 in past-this is very similar to past ? ?There are no preventive care reminders to display for this patient. ? ?Past Medical History:  ?Diagnosis Date  ? ALLERGIC RHINITIS 09/17/2009  ? ASTHMA 09/17/2009  ? HYPERLIPIDEMIA 09/17/2009  ? ? ?Past Surgical History:  ?Procedure Laterality Date  ? HIP SURGERY    ? Oct 2020  ? NASAL SINUS SURGERY Right 12/04/2014  ? Dr. Jenne Pane  ? SINUS EXPLORATION  Nov 2014   ? per Dr. Jenne Pane   ? ? ?Outpatient Medications Prior to Visit  ?Medication Sig Dispense Refill  ? albuterol (PROVENTIL HFA;VENTOLIN HFA) 108 (90 Base) MCG/ACT inhaler Take 2 puffs by mouth every 6 hours as needed 18 Inhaler 0  ? Ascorbic Acid (VITAMIN C) 1000 MG tablet Take 1,000 mg by mouth daily.    ? EPINEPHrine 0.3 mg/0.3 mL IJ SOAJ injection as directed.    ? fexofenadine (ALLEGRA) 180 MG tablet Take 180 mg by mouth daily.    ? fluticasone (FLONASE) 50 MCG/ACT nasal spray 2 sprays by Nasal route daily.      ? levocetirizine (XYZAL) 5 MG tablet Take 5 mg by mouth daily.    ? Multiple Vitamin (MULTI-VITAMIN) tablet Take 1 tablet by mouth daily.    ? Olopatadine HCl 0.6 % SOLN by Nasal route. 2 sprays to each nostril daily     ? valACYclovir (VALTREX) 1000 MG tablet TAKE 1 TABLET (1,000 MG TOTAL) BY MOUTH DAILY. TAKE FOR 5 DAYS AT FIRST SIGN OF ILLNESS. 90 tablet 1  ? Cholecalciferol  (VITAMIN D3) 50 MCG (2000 UT) TABS Take 1 tablet by mouth daily.    ? cyclobenzaprine (FLEXERIL) 10 MG tablet Take 1 tablet (10 mg total) by mouth 2 (two) times daily as needed for muscle spasms. 20 tablet 0  ? DOXYCYCLINE PO Take by mouth 2 (two) times daily.    ? meloxicam (MOBIC) 15 MG tablet Take 1 tablet (15 mg total) by mouth daily. 30 tablet 0  ? methylPREDNISolone (MEDROL DOSEPAK) 4 MG TBPK tablet Take as directed 1 each 0  ? montelukast (SINGULAIR) 10 MG tablet TAKE 1 TABLET IN THE EVENING ONCE A DAY  5  ? QUERCETIN PO Take 500 mg by mouth daily.    ? zinc gluconate 50 MG tablet Take 50 mg by mouth daily.    ? ?No facility-administered medications prior to visit.  ? ? ?Allergies  ?Allergen Reactions  ? Prednisolone Other (See Comments)  ? Prednisone   ?  Other reaction(s): Other (See Comments), Unknown ?hiccups ?  ? ?ROS neg/noncontributory except as noted HPI/below ? ? ?   ?Objective:  ?  ? ?BP 122/84   Pulse 74   Temp 97.6 ?F (36.4 ?C) (Temporal)  Ht 6\' 1"  (1.854 m)   Wt 226 lb 2 oz (102.6 kg)   SpO2 99%   BMI 29.83 kg/m?  ?Wt Readings from Last 3 Encounters:  ?03/18/21 226 lb 2 oz (102.6 kg)  ?03/08/21 229 lb (103.9 kg)  ?02/07/21 223 lb (101.2 kg)  ? ? ?Physical Exam  ? ?Gen: WDWN NAD WM ?HEENT: NCAT, conjunctiva not injected, sclera nonicteric ?TM WNL B, OP moist, no exudates.  Tender L frontal sinus ?NECK:  supple, no thyromegaly, no nodes, no carotid bruits ?CARDIAC: RRR, S1S2+, no murmur. DP 2+B ?LUNGS: CTAB. No wheezes ?EXT:  no edema ?MSK: no gross abnormalities.  ?NEURO: A&O x3.  CN II-XII intact.  ?PSYCH: normal mood. Good eye contact ?L knee-tender spot L medial knee-seems to be blood vessel.  No TTP joint line ? ?Results for orders placed or performed in visit on 03/18/21  ?POC COVID-19  ?Result Value Ref Range  ? SARS Coronavirus 2 Ag Negative Negative  ?  ? ? ? ?   ?Assessment & Plan:  ? ?Problem List Items Addressed This Visit   ?None ?Visit Diagnoses   ? ? Acute non-recurrent  frontal sinusitis    -  Primary  ? Relevant Medications  ? levocetirizine (XYZAL) 5 MG tablet  ? cefdinir (OMNICEF) 300 MG capsule  ? Other Relevant Orders  ? POC COVID-19 (Completed)  ? Acute pain of left knee      ? Relevant Orders  ? Ambulatory referral to Orthopedics  ? ?  ? L frontal sinusitis-still need to consider covid.  This is his usual sinusitis symptoms.  Omnicef per pt.   ?L knee lump-painful-?cyst under blood vessel.  ?vascular(but no other varicose veins).  To Ortho-pt prefers Dr. 03/20/21 Case as has relationship w/him ? ?Meds ordered this encounter  ?Medications  ? cefdinir (OMNICEF) 300 MG capsule  ?  Sig: Take 1 capsule (300 mg total) by mouth 2 (two) times daily.  ?  Dispense:  14 capsule  ?  Refill:  0  ? ? ?[place], MD ? ?

## 2021-03-20 ENCOUNTER — Encounter: Payer: Self-pay | Admitting: Family Medicine

## 2021-03-20 MED ORDER — AZITHROMYCIN 250 MG PO TABS: ORAL_TABLET | ORAL | 0 refills | Status: AC

## 2021-03-20 NOTE — Telephone Encounter (Signed)
Please advise 

## 2021-03-20 NOTE — Telephone Encounter (Signed)
Call patient stated he will call in 5 min ?

## 2021-03-20 NOTE — Addendum Note (Signed)
Addended by: Angelena Sole on: 03/20/2021 05:08 PM ? ? Modules accepted: Orders ? ?

## 2021-03-20 NOTE — Telephone Encounter (Signed)
Spoke with patient stated [employer] work for him in the past  ?

## 2021-03-29 NOTE — Progress Notes (Signed)
? Benito Mccreedy D.Merril Abbe ?[facility] ?[place] ?Phone: [phone] ?  ?Assessment and Plan:   ?  ?1. Chronic left SI joint pain ?2. Somatic dysfunction of lumbar region ?3. Somatic dysfunction of pelvic region ?4. Somatic dysfunction of sacral region ?-Chronic, stable, subsequent visit ?- Overall significantly improved musculoskeletal complaints with most prominent typically being left SI joint dysfunction, however patient doing very well today with regular OMT appointments ?- Patient has received significant relief with OMT in the past.  Elects for repeat OMT today.  Tolerated well per note below. ?- Decision today to treat with OMT was based on Physical Exam ?  ?After verbal consent patient was treated with HVLA (high velocity low amplitude), ME (muscle energy), FPR (flex positional release), ST (soft tissue), PC/PD (Pelvic Compression/ Pelvic Decompression) techniques in sacrum, lumbar, and pelvic areas. Patient tolerated the procedure well with improvement in symptoms.  Patient educated on potential side effects of soreness and recommended to rest, hydrate, and use Tylenol as needed for pain control. ?  ?Pertinent previous records reviewed include none ?  ?Follow Up: 4 weeks for repeat OMT maintenance ?  ?Subjective:   ?I, Pincus Badder, am serving as a Education administrator for Doctor [employer] ? ?Chief Complaint: OMT follow up  ? ?HPI:  ?01/23/2021 ?Patient is a 41 year old male complaining of back pain. Patient states He says he was lifting kettle bells yesterday morning and felt a sudden pull in his left lower back.  He has had ongoing pain in his left lower back since that time.  Its worse with movement and standing and trying to turn.  He denies any other injuries.  No radiation down his legs.  No numbness or weakness to his legs. Ed gave him muscle relaxer,  ?  ?02/07/2021 ?Patient states that his back has been hurting some , went to [facility] and thinks sitting and  standing so much the SI area is just tight  ?  ? 03/08/2021 ?Patient states doing the same  ?  ? 04/01/2021 ?Patient states that he doing good  ?  ?Relevant Historical Information: History of bilateral hip labrum repairs ? ?Additional pertinent review of systems negative. ? ?Current Outpatient Medications  ?Medication Sig Dispense Refill  ? albuterol (PROVENTIL HFA;VENTOLIN HFA) 108 (90 Base) MCG/ACT inhaler Take 2 puffs by mouth every 6 hours as needed 18 Inhaler 0  ? Ascorbic Acid (VITAMIN C) 1000 MG tablet Take 1,000 mg by mouth daily.    ? EPINEPHrine 0.3 mg/0.3 mL IJ SOAJ injection as directed.    ? fexofenadine (ALLEGRA) 180 MG tablet Take 180 mg by mouth daily.    ? fluticasone (FLONASE) 50 MCG/ACT nasal spray 2 sprays by Nasal route daily.      ? levocetirizine (XYZAL) 5 MG tablet Take 5 mg by mouth daily.    ? Multiple Vitamin (MULTI-VITAMIN) tablet Take 1 tablet by mouth daily.    ? Olopatadine HCl 0.6 % SOLN by Nasal route. 2 sprays to each nostril daily     ? valACYclovir (VALTREX) 1000 MG tablet TAKE 1 TABLET (1,000 MG TOTAL) BY MOUTH DAILY. TAKE FOR 5 DAYS AT FIRST SIGN OF ILLNESS. 90 tablet 1  ? ?No current facility-administered medications for this visit.  ?  ?  ?Objective:   ?  ?Vitals:  ? 04/01/21 1302  ?BP: 122/82  ?Pulse: 90  ?SpO2: 98%  ?Weight: 228 lb (103.4 kg)  ?Height: 6\' 1"  (1.854 m)  ?  ?  ?  Body mass index is 30.08 kg/m?.  ?  ?Physical Exam:   ?  ?General: Well-appearing, cooperative, sitting comfortably in no acute distress.  ? ?OMT Physical Exam: ? ?ASIS Compression Test: Positive left ?Sacrum: TTP mildly left sacral base, positive sphinx ?Lumbar: TTP paraspinal, L1-3 RLSR ?Pelvis: Left anterior innominate ? ?Electronically signed by:  ?Benito Mccreedy D.Merril Abbe ?[facility] ?1:58 PM 04/01/21 ?

## 2021-04-01 ENCOUNTER — Ambulatory Visit: Payer: 59 | Admitting: Sports Medicine

## 2021-04-01 ENCOUNTER — Other Ambulatory Visit: Payer: Self-pay

## 2021-04-01 VITALS — BP 122/82 | HR 90 | Ht 73.0 in | Wt 228.0 lb

## 2021-04-01 DIAGNOSIS — M9905 Segmental and somatic dysfunction of pelvic region: Secondary | ICD-10-CM

## 2021-04-01 DIAGNOSIS — M9903 Segmental and somatic dysfunction of lumbar region: Secondary | ICD-10-CM

## 2021-04-01 DIAGNOSIS — M9904 Segmental and somatic dysfunction of sacral region: Secondary | ICD-10-CM | POA: Diagnosis not present

## 2021-04-01 DIAGNOSIS — M533 Sacrococcygeal disorders, not elsewhere classified: Secondary | ICD-10-CM

## 2021-04-01 DIAGNOSIS — G8929 Other chronic pain: Secondary | ICD-10-CM

## 2021-04-01 NOTE — Patient Instructions (Addendum)
Good to see you  °4 week follow up for repeat OMT °

## 2021-04-26 NOTE — Progress Notes (Signed)
? Tony [name] D.[name] ?[name] ?[address] [place] [address] ?Phone: [phone] ?  ?Assessment and Plan:   ?  ?1. Chronic left SI joint pain ?2. Somatic dysfunction of lumbar region ?3. Somatic dysfunction of pelvic region ?4. Somatic dysfunction of sacral region ?-Chronic with exacerbation, subsequent visit ?- Overall improvement in low back pain generally localized to left SI with regular OMT visits ?- Patient has received significant relief with OMT in the past.  Elects for repeat OMT today.  Tolerated well per note below. ?- Decision today to treat with OMT was based on Physical Exam ?  ?After verbal consent patient was treated with HVLA (high velocity low amplitude), ME (muscle energy), FPR (flex positional release), ST (soft tissue), PC/PD (Pelvic Compression/ Pelvic Decompression) techniques in cervical, rib, thoracic, lumbar, and pelvic areas. Patient tolerated the procedure well with improvement in symptoms.  Patient educated on potential side effects of soreness and recommended to rest, hydrate, and use Tylenol as needed for pain control. ?  ?Pertinent previous records reviewed include none ?  ?Follow Up: 4 to 6 weeks for repeat OMT maintenance ?  ?Subjective:   ?Tony [name], am serving as a Neurosurgeon for Doctor [employer] ? ?Chief Complaint: OMT follow up  ?  ?HPI:  ?01/23/2021 ?Patient is a 41 year old male complaining of back pain. Patient states He says he was lifting kettle bells yesterday morning and felt a sudden pull in his left lower back.  He has had ongoing pain in his left lower back since that time.  Its worse with movement and standing and trying to turn.  He denies any other injuries.  No radiation down his legs.  No numbness or weakness to his legs. Ed gave him muscle relaxer,  ?  ?02/07/2021 ?Patient states that his back has been hurting some , went to [facility] and thinks sitting and standing so much the SI area is just tight  ?  ? 03/08/2021 ?Patient  states doing the same  ?  ? 04/01/2021 ?Patient states that he doing good  ? ?04/29/2021 ?Patient states that hes doing good.  ?  ?Relevant Historical Information: History of bilateral hip labrum repairs ? ?Additional pertinent review of systems negative. ? ?Current Outpatient Medications  ?Medication Sig Dispense Refill  ? albuterol (PROVENTIL HFA;VENTOLIN HFA) 108 (90 Base) MCG/ACT inhaler Take 2 puffs by mouth every 6 hours as needed (Patient not taking: Reported on 04/29/2021) 18 Inhaler 0  ? Ascorbic Acid (VITAMIN C) 1000 MG tablet Take 1,000 mg by mouth daily. (Patient not taking: Reported on 04/29/2021)    ? EPINEPHrine 0.3 mg/0.3 mL IJ SOAJ injection as directed. (Patient not taking: Reported on 04/29/2021)    ? fexofenadine (ALLEGRA) 180 MG tablet Take 180 mg by mouth daily. (Patient not taking: Reported on 04/29/2021)    ? fluticasone (FLONASE) 50 MCG/ACT nasal spray 2 sprays by Nasal route daily.   (Patient not taking: Reported on 04/29/2021)    ? levocetirizine (XYZAL) 5 MG tablet Take 5 mg by mouth daily. (Patient not taking: Reported on 04/29/2021)    ? Multiple Vitamin (MULTI-VITAMIN) tablet Take 1 tablet by mouth daily. (Patient not taking: Reported on 04/29/2021)    ? Olopatadine HCl 0.6 % SOLN by Nasal route. 2 sprays to each nostril daily  (Patient not taking: Reported on 04/29/2021)    ? valACYclovir (VALTREX) 1000 MG tablet TAKE 1 TABLET (1,000 MG TOTAL) BY MOUTH DAILY. TAKE FOR 5 DAYS AT FIRST SIGN  OF ILLNESS. (Patient not taking: Reported on 04/29/2021) 90 tablet 1  ? ?No current facility-administered medications for this visit.  ?  ?  ?Objective:   ?  ?Vitals:  ? 04/29/21 1255  ?BP: 120/80  ?Pulse: 91  ?SpO2: 97%  ?Weight: 227 lb (103 kg)  ?Height: 6\' 1"  (1.854 m)  ?  ?  ?Body mass index is 29.95 kg/m?.  ?  ?Physical Exam:   ?  ?General: Well-appearing, cooperative, sitting comfortably in no acute distress.  ? ?OMT Physical Exam: ? ?ASIS Compression Test: Positive left ?Sacrum: Positive sphinx.  Mildly  TTP left sacral base ?Lumbar: TTP paraspinal, L1-3 RRSL ?Pelvis: Left posterior innominate ? ?Electronically signed by:  ? D.Tony [name] ?[facility] ?1:11 PM 04/29/21 ?

## 2021-04-29 ENCOUNTER — Ambulatory Visit: Payer: 59 | Admitting: Sports Medicine

## 2021-04-29 VITALS — BP 120/80 | HR 91 | Ht 73.0 in | Wt 227.0 lb

## 2021-04-29 DIAGNOSIS — M9903 Segmental and somatic dysfunction of lumbar region: Secondary | ICD-10-CM | POA: Diagnosis not present

## 2021-04-29 DIAGNOSIS — M533 Sacrococcygeal disorders, not elsewhere classified: Secondary | ICD-10-CM | POA: Diagnosis not present

## 2021-04-29 DIAGNOSIS — M9905 Segmental and somatic dysfunction of pelvic region: Secondary | ICD-10-CM | POA: Diagnosis not present

## 2021-04-29 DIAGNOSIS — G8929 Other chronic pain: Secondary | ICD-10-CM

## 2021-04-29 DIAGNOSIS — M9904 Segmental and somatic dysfunction of sacral region: Secondary | ICD-10-CM | POA: Diagnosis not present

## 2021-04-29 NOTE — Patient Instructions (Signed)
Good to see you   

## 2021-05-16 ENCOUNTER — Other Ambulatory Visit: Payer: 59

## 2021-05-21 ENCOUNTER — Encounter: Payer: 59 | Admitting: Family Medicine

## 2021-05-30 NOTE — Progress Notes (Signed)
Tony [name] D.[place] Phone: [phone]   Assessment and Plan:    1. Chronic left SI joint pain 2. Somatic dysfunction of lumbar region 3. Somatic dysfunction of pelvic region 4. Somatic dysfunction of sacral region -Chronic with exacerbation, subsequent visit - Overall improvement in low back pain generally localized to left SI with regular OMT visits - Patient has received significant relief with OMT in the past.  Elects for repeat OMT today.  Tolerated well per note below. - Decision today to treat with OMT was based on Physical Exam   After verbal consent patient was treated with HVLA (high velocity low amplitude), ME (muscle energy), FPR (flex positional release), ST (soft tissue), PC/PD (Pelvic Compression/ Pelvic Decompression) techniques in cervical, rib, thoracic, lumbar, and pelvic areas. Patient tolerated the procedure well with improvement in symptoms.  Patient educated on potential side effects of soreness and recommended to rest, hydrate, and use Tylenol as needed for pain control.   Pertinent previous records reviewed include none   Follow Up: 4 to 6 weeks for repeat OMT and continued treatment   Subjective:   Tony [name], am serving as a Education administrator for Doctor [employer]  Chief Complaint: OMT follow up    HPI:  01/23/2021 Patient is a 41 year old male complaining of back pain. Patient states He says he was lifting kettle bells yesterday morning and felt a sudden pull in his left lower back.  He has had ongoing pain in his left lower back since that time.  Its worse with movement and standing and trying to turn.  He denies any other injuries.  No radiation down his legs.  No numbness or weakness to his legs. Ed gave him muscle relaxer,    02/07/2021 Patient states that his back has been hurting some , went to [place] and thinks sitting and standing so much the SI area is just tight      03/08/2021 Patient states doing the same     04/01/2021 Patient states that he doing good    04/29/2021 Patient states that hes doing good.   06/04/2021 Patient states hes doing good    Relevant Historical Information: History of bilateral hip labrum repairs  Additional pertinent review of systems negative.  Current Outpatient Medications  Medication Sig Dispense Refill   albuterol (PROVENTIL HFA;VENTOLIN HFA) 108 (90 Base) MCG/ACT inhaler Take 2 puffs by mouth every 6 hours as needed 18 Inhaler 0   Ascorbic Acid (VITAMIN C) 1000 MG tablet Take 1,000 mg by mouth daily.     EPINEPHrine 0.3 mg/0.3 mL IJ SOAJ injection as directed.     fexofenadine (ALLEGRA) 180 MG tablet Take 180 mg by mouth daily.     fluticasone (FLONASE) 50 MCG/ACT nasal spray Place 2 sprays into the nose daily.     levocetirizine (XYZAL) 5 MG tablet Take 5 mg by mouth daily.     Multiple Vitamin (MULTI-VITAMIN) tablet Take 1 tablet by mouth daily.     Olopatadine HCl 0.6 % SOLN Place into the nose. 2 sprays to each nostril daily     valACYclovir (VALTREX) 1000 MG tablet TAKE 1 TABLET (1,000 MG TOTAL) BY MOUTH DAILY. TAKE FOR 5 DAYS AT FIRST SIGN OF ILLNESS. 90 tablet 1   No current facility-administered medications for this visit.      Objective:     Vitals:   06/04/21 1055  BP: 130/80  Pulse: 100  SpO2: 98%  Weight: 227 lb (103 kg)  Height: 6\' 1"  (1.854 m)      Body mass index is 29.95 kg/m.    Physical Exam:     General: Well-appearing, cooperative, sitting comfortably in no acute distress.   OMT Physical Exam:   ASIS Compression Test: Positive left Sacrum: Positive sphinx.  Mildly TTP left sacral base Lumbar: TTP paraspinal, L1-3 RRSL Pelvis: Left posterior innominate  Electronically signed by:  Tony [name] D.[facility] 11:32 AM 06/04/21

## 2021-06-04 ENCOUNTER — Ambulatory Visit: Payer: 59 | Admitting: Sports Medicine

## 2021-06-04 VITALS — BP 130/80 | HR 100 | Ht 73.0 in | Wt 227.0 lb

## 2021-06-04 DIAGNOSIS — M9904 Segmental and somatic dysfunction of sacral region: Secondary | ICD-10-CM

## 2021-06-04 DIAGNOSIS — M9903 Segmental and somatic dysfunction of lumbar region: Secondary | ICD-10-CM | POA: Diagnosis not present

## 2021-06-04 DIAGNOSIS — M9905 Segmental and somatic dysfunction of pelvic region: Secondary | ICD-10-CM

## 2021-06-04 DIAGNOSIS — M533 Sacrococcygeal disorders, not elsewhere classified: Secondary | ICD-10-CM

## 2021-06-04 DIAGNOSIS — G8929 Other chronic pain: Secondary | ICD-10-CM | POA: Diagnosis not present

## 2021-06-04 NOTE — Patient Instructions (Addendum)
Good to see you  4-6 week repeat OMT

## 2021-07-17 NOTE — Progress Notes (Deleted)
[name] [address] [place] [address] Phone: [phone] Subjective:    I'm seeing this patient by the request  of:  Shelva Majestic, MD  CC: back pain   [id number]  Tony [name] is a 41 y.o. male coming in with complaint of lumbar spine and hip pain. Last seen in November 2020. Has been seeing Dr. Jean Rosenthal for OMT. Patient states        Past Medical History:  Diagnosis Date   ALLERGIC RHINITIS 09/17/2009   ASTHMA 09/17/2009   HYPERLIPIDEMIA 09/17/2009   Past Surgical History:  Procedure Laterality Date   HIP SURGERY     Oct 2020   NASAL SINUS SURGERY Right 12/04/2014   Dr. Jenne Pane   SINUS EXPLORATION  Nov 2014    per Dr. Jenne Pane    Social History   Socioeconomic History   Marital status: Single    Spouse name: Not on file   Number of children: Not on file   Years of education: Not on file   Highest education level: Not on file  Occupational History   Not on file  Tobacco Use   Smoking status: Never   Smokeless tobacco: Never  Substance and Sexual Activity   Alcohol use: Yes    Comment: occ   Drug use: No   Sexual activity: Not on file  Other Topics Concern   Not on file  Social History Narrative   Married. 2 children. [name] (May 2016). [name] (July 2013). [employer]   Wife works as Emergency planning/management officer since 2012. Investigative division in 2021.       Hobbies: time with sons, going to gym- goal 7 days a week   [name]   Social Determinants of Health   Financial Resource Strain: Not on [employer] Insecurity: Not on file  Transportation Needs: Not on file  Physical Activity: Not on file  Stress: Not on file  Social Connections: Not on file   Allergies  Allergen Reactions   Prednisolone Other (See Comments)   Prednisone     Other reaction(s): Other (See Comments), Unknown hiccups    Family History  Problem Relation Age of Onset   Healthy Mother    Sinusitis Father         recurrent- sinus surgery multiple. rare fungal infection 20 cases in world.    CAD Father        age 7 first heart attack. never smoker   Alcohol abuse Father    Depression Father        abused pain pills in past   Atrial fibrillation Father    Kidney disease Father        lifestyle/treatment of sinus issues related   Stroke Father        related to a fib- watchman device   Dementia Father        vascular   Healthy Brother    Healthy Son    Healthy Son      Current Outpatient Medications (Cardiovascular):    EPINEPHrine 0.3 mg/0.3 mL IJ SOAJ injection, as directed.  Current Outpatient Medications (Respiratory):    albuterol (PROVENTIL HFA;VENTOLIN HFA) 108 (90 Base) MCG/ACT inhaler, Take 2 puffs by mouth every 6 hours as needed   fexofenadine (ALLEGRA) 180 MG tablet, Take 180 mg by mouth daily.   fluticasone (FLONASE) 50 MCG/ACT nasal spray, Place 2 sprays into the nose daily.   levocetirizine (XYZAL) 5 MG  tablet, Take 5 mg by mouth daily.   Olopatadine HCl 0.6 % SOLN, Place into the nose. 2 sprays to each nostril daily    Current Outpatient Medications (Other):    Ascorbic Acid (VITAMIN C) 1000 MG tablet, Take 1,000 mg by mouth daily.   Multiple Vitamin (MULTI-VITAMIN) tablet, Take 1 tablet by mouth daily.   valACYclovir (VALTREX) 1000 MG tablet, TAKE 1 TABLET (1,000 MG TOTAL) BY MOUTH DAILY. TAKE FOR 5 DAYS AT FIRST SIGN OF ILLNESS.   Reviewed prior external information including notes and imaging from  primary care provider As well as notes that were available from care everywhere and other healthcare systems.  Past medical history, social, surgical and family history all reviewed in electronic medical record.  No pertanent information unless stated regarding to the chief complaint.   Review of Systems:  No headache, visual changes, nausea, vomiting, diarrhea, constipation, dizziness, abdominal pain, skin rash, fevers, chills, night sweats, weight loss, swollen lymph  nodes, body aches, joint swelling, chest pain, shortness of breath, mood changes. POSITIVE muscle aches  Objective  There were no vitals taken for this visit.   General: No apparent distress alert and oriented x3 mood and affect normal, dressed appropriately.  HEENT: Pupils equal, extraocular movements intact  Respiratory: Patient's speak in full sentences and does not appear short of breath  Cardiovascular: No lower extremity edema, non tender, no erythema      Impression and Recommendations:    The above documentation has been reviewed and is accurate and complete Judi Saa, DO

## 2021-07-18 ENCOUNTER — Ambulatory Visit: Payer: 59 | Admitting: Family Medicine

## 2021-07-23 IMAGING — MR MR HIP*L* W/CM
4 of 5 series · 20 of 40 positions shown · IV contrast (agent unspecified)
Comparison: None.

CLINICAL DATA: Left hip pain for 7 months.  No known injury.

EXAM:
MRI OF THE LEFT HIP WITH CONTRAST (MR Arthrogram)
TECHNIQUE: Multiplanar, multisequence MR imaging of the hip was performed
immediately following contrast injection into the hip joint under
fluoroscopic guidance. No intravenous contrast was administered.

[Series 3: T1 · coronal · 4.0mm · 0.70mm/px · 8 of 24 slices shown]
[im 1/24]
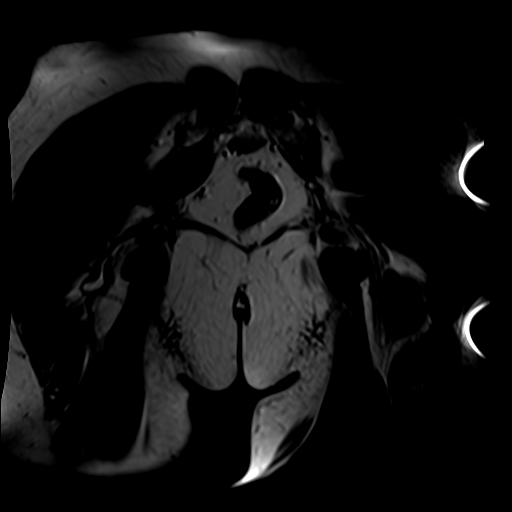
[im 4/24]
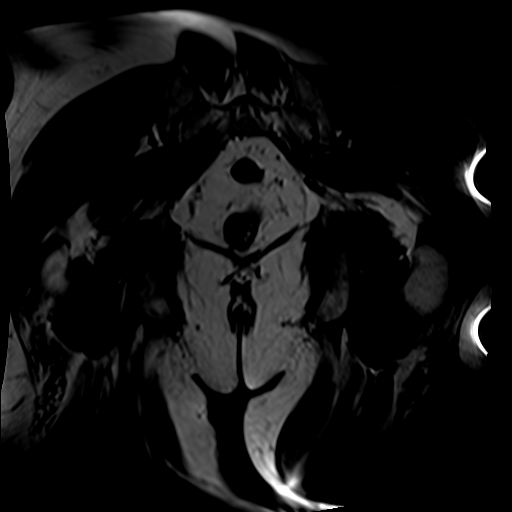
[im 7/24]
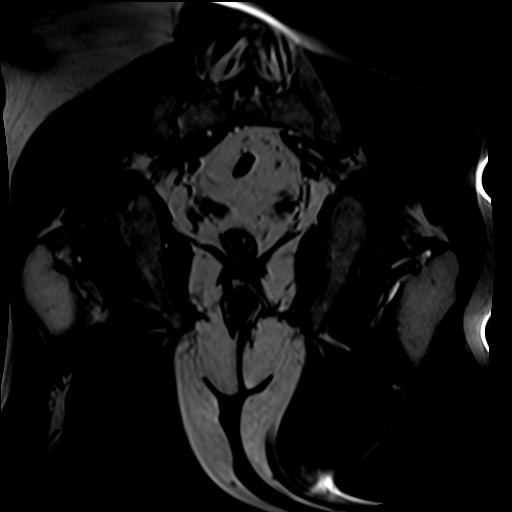
[im 10/24]
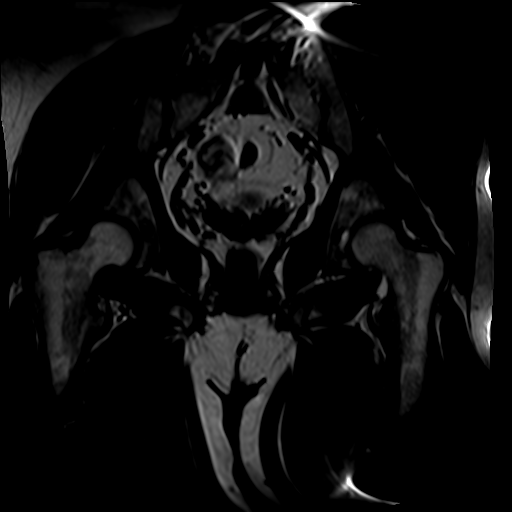
[im 14/24]
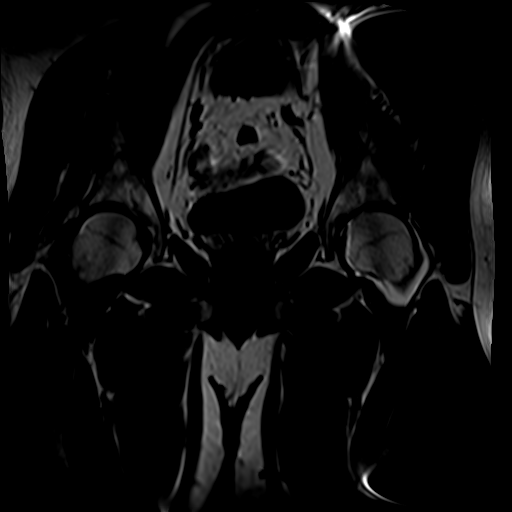
[im 17/24]
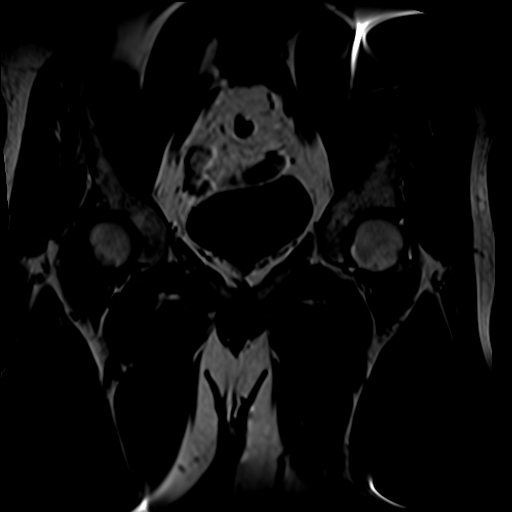
[im 20/24]
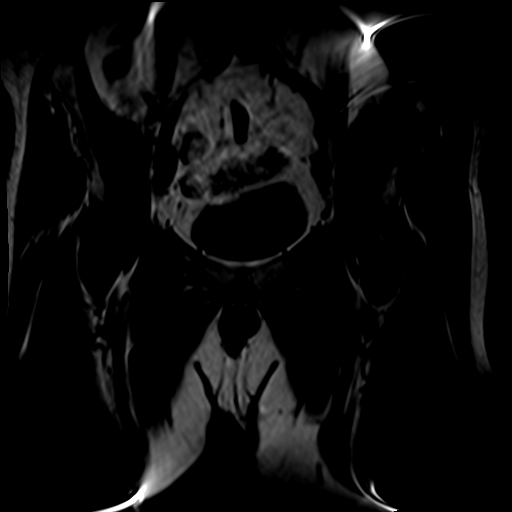
[im 24/24]
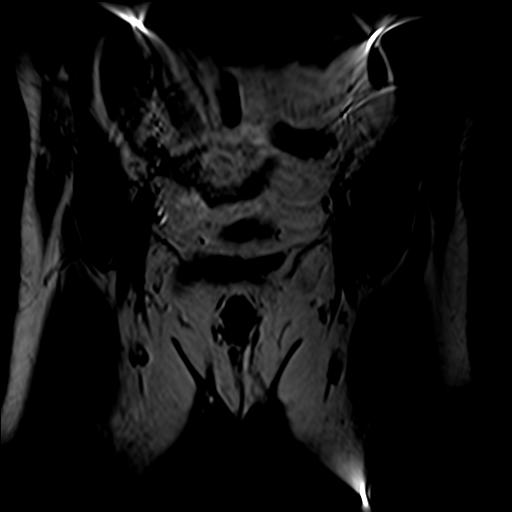

[Series 4: T2 fat-sat · coronal · 4.0mm · 0.56mm/px · 6 of 24 slices shown]
[im 1/24]
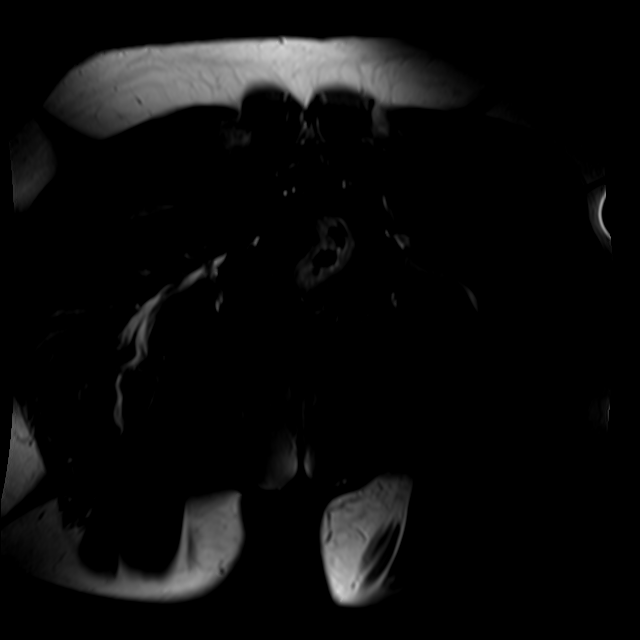
[im 4/24]
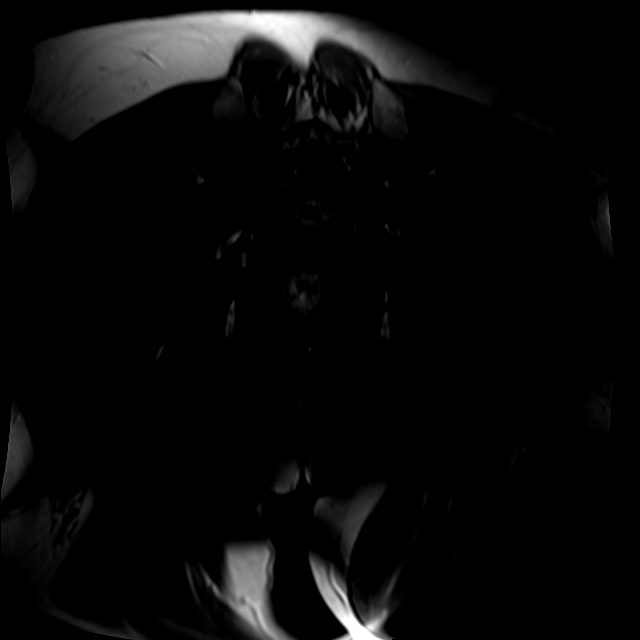
[im 8/24]
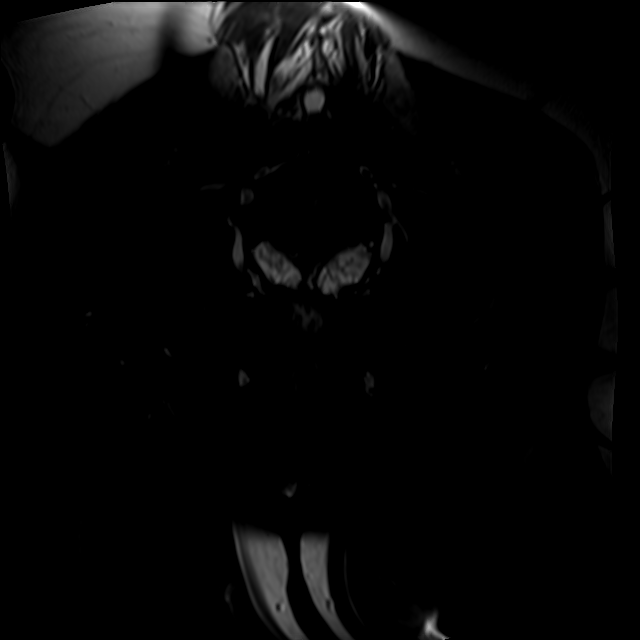
[im 12/24]
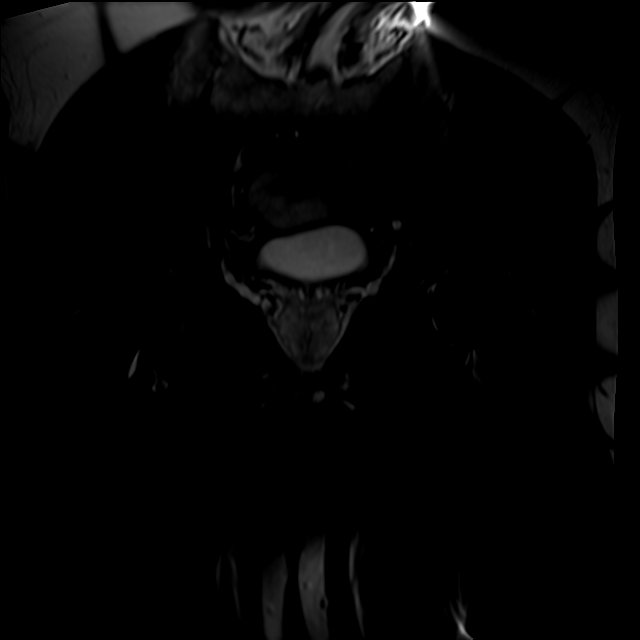
[im 16/24]
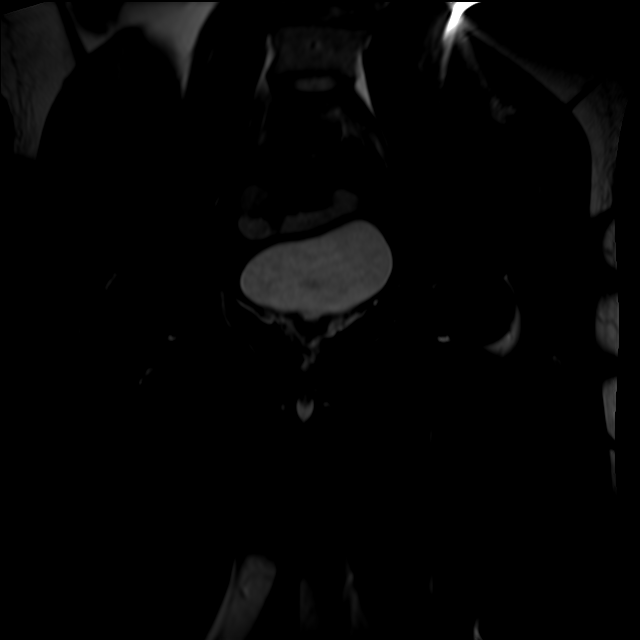
[im 20/24]
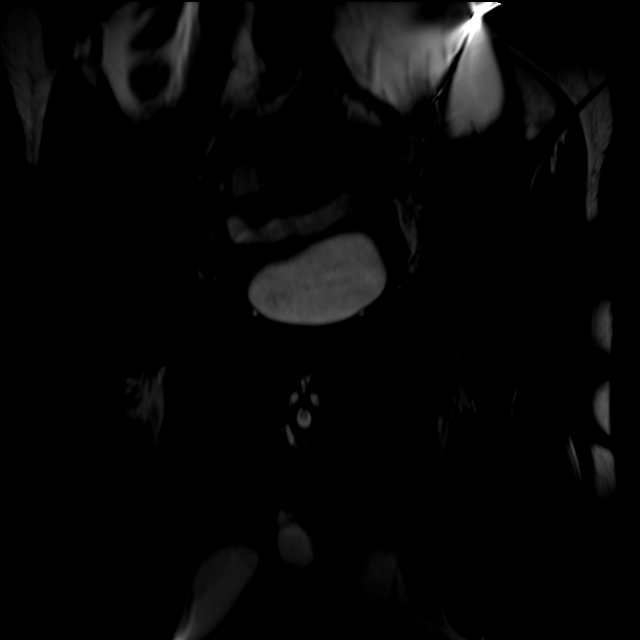

[Series 6: T1 fat-sat · oblique · 4.0mm · 0.70mm/px · 3 of 28 slices shown (1 of 2)]
[im 4/28]
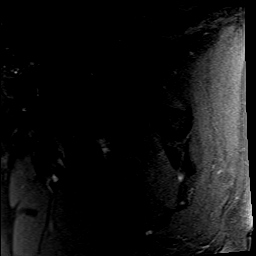
[im 14/28]
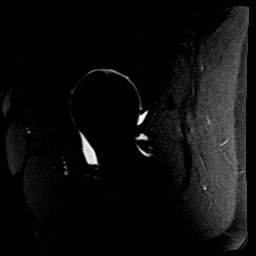
[im 24/28]
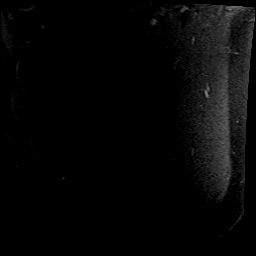

[Series 7: T1 fat-sat · sagittal · 4.0mm · 0.70mm/px · 3 of 30 slices shown (2 of 2)]
[im 4/30]
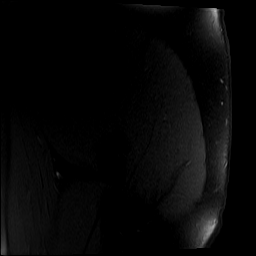
[im 15/30]
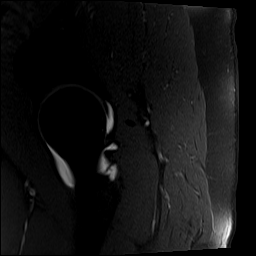
[im 26/30]
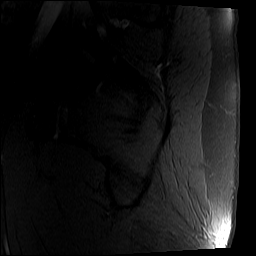

[20 of 40 positions shown; findings below may reference images not displayed]

FINDINGS: Bone

No hip fracture, dislocation or avascular necrosis. No aggressive
osseous lesion.

SI joints are normal. No SI joint widening or erosive changes.

Lower lumbar spine demonstrates no focal abnormality.

Alignment

Normal. No subluxation.

Dysplasia

None.

Joint effusion

Intraarticular contrast distending the left hip joint capsule.

Labrum

Left superior labral irregularity along the free edge without a
discrete tear.

Cartilage

Femoral cartilage: Normal.

Acetabular cartilage: Normal.

Capsule and ligaments

Normal.

Muscles and Tendons

Flexors: Normal.

Extensors: Normal.

Abductors: Normal.

Adductors: Normal.

Rotators: Normal.

Hamstrings: Normal.

Other Findings

None

Viscera

Normal. No abnormality seen in pelvis. No lymphadenopathy. No free
fluid in the pelvis.
IMPRESSION: 1. Left superior labral irregularity along the free edge without a
discrete tear.
2. No chondral abnormality of the left hip.

## 2021-07-23 IMAGING — XA DG FLUORO GUIDE NDL PLC/BX
3 series · 3 of 3 positions shown · non-contrast
Comparison: none

CLINICAL DATA: Left hip femoroacetabular impingement

EXAM:
LEFT HIP INJECTION UNDER FLUOROSCOPY FOR MRI
FLUOROSCOPY TIME:  8 seconds; 14 uUymR DAP
TECHNIQUE: The procedure, risks (including but not limited to bleeding,
infection, organ damage ), benefits, and alternatives were explained
to the patient. Questions regarding the procedure were encouraged
and answered. The patient understands and consents to the procedure.
An appropriate skin entry site was determined under fluoroscopy.
Skin site was marked, prepped with Betadine, and draped in usual
sterile fashion, and infiltrated locally with 1% lidocaine.

[Series 1: ortho standard · 1 of 1 slices shown (1 of 3)]
[im 1/1]
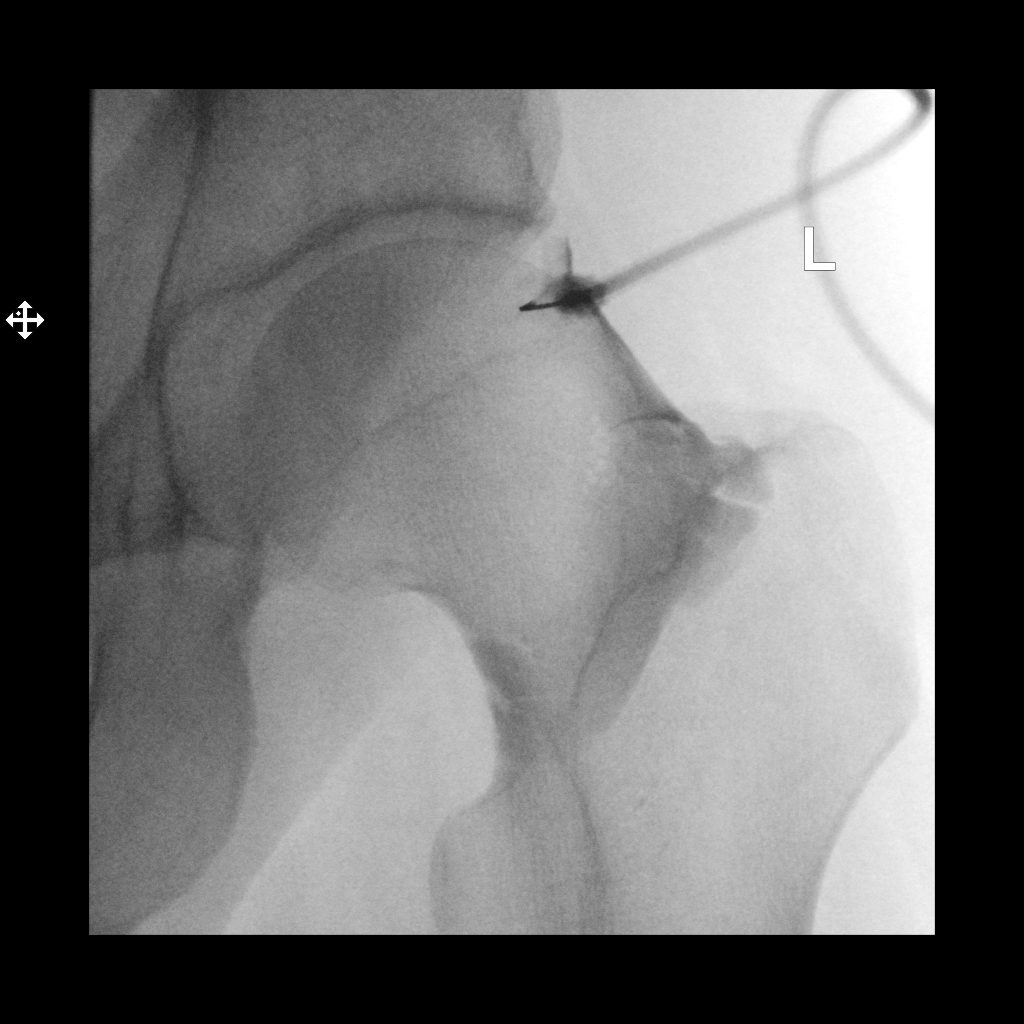

[Series 2: ortho standard · 1 of 1 slices shown (2 of 3)]
[im 1/1]
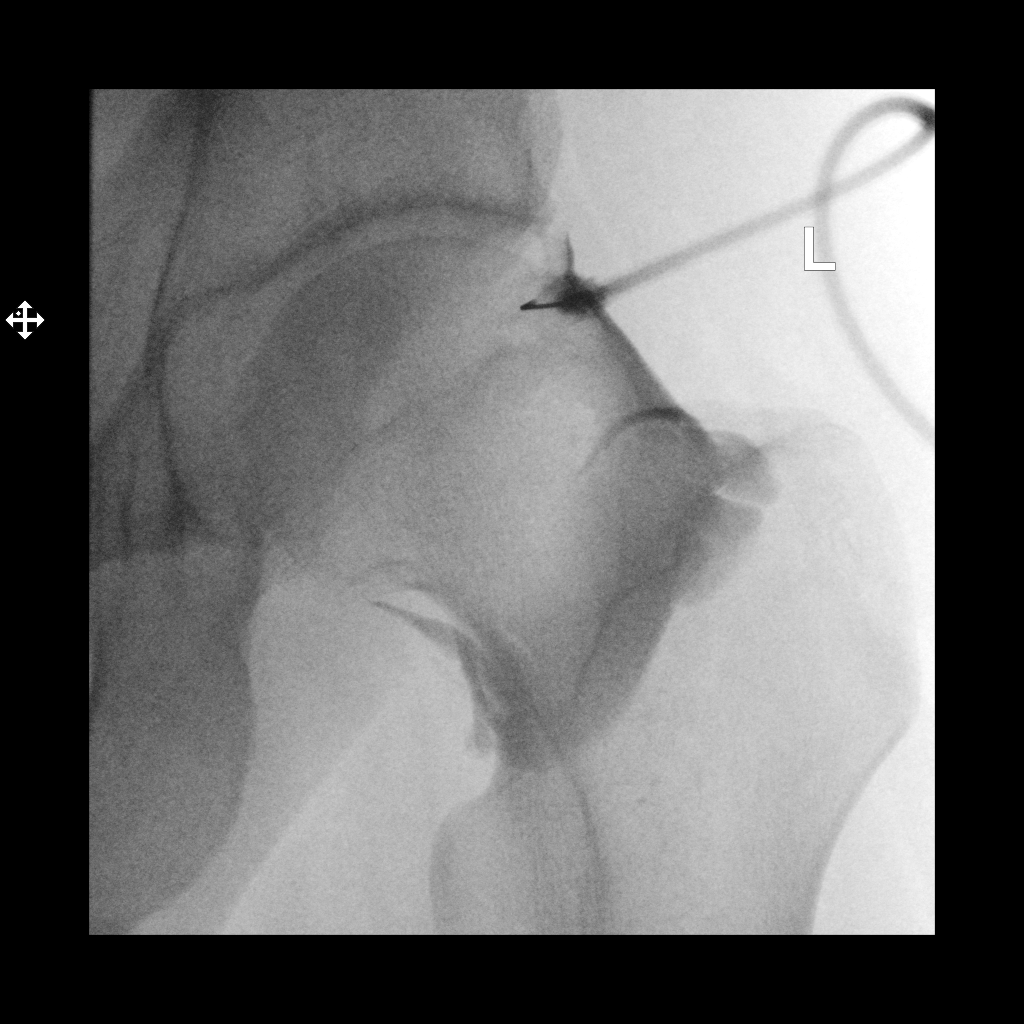

[Series 3: ortho standard · 1 of 1 slices shown (3 of 3)]
[im 1/1]
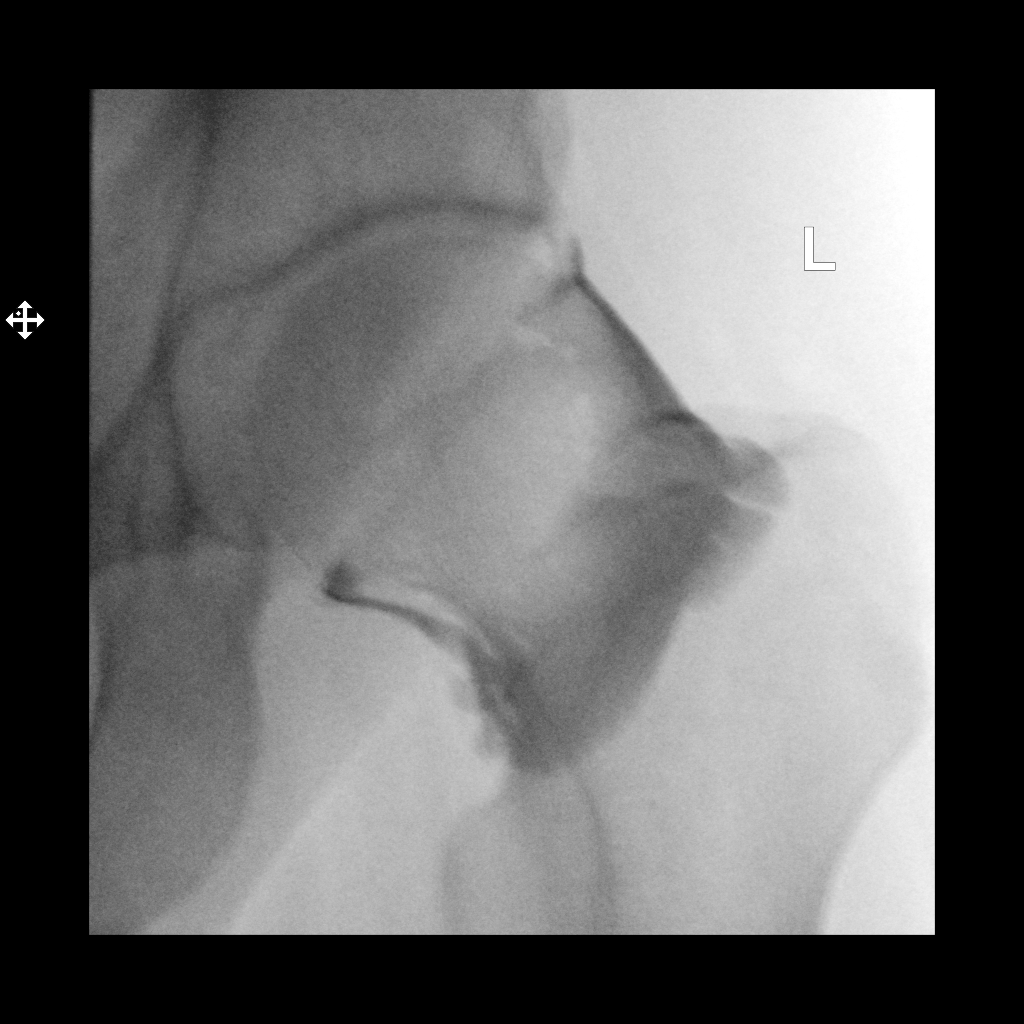

[3 of 3 positions shown; findings below may reference images not displayed]

22-gauge spinal needle advanced to the superior lateral margin of
the femoral head. 1 mL of lidocaine 1% injected easily. 12ml of a
mixture of 10 mL saline, and 10ml iodinated contrast with 0.1ml
Multihance contrast was injected into the joint. Intraarticular flow
was confirmed on fluoroscopy. Patient transferred to MRI.
IMPRESSION: 1. Technically successful left hip injection for MRI

## 2021-07-23 NOTE — Progress Notes (Unsigned)
[name] [address] [place] [address] Phone: [phone]   Assessment and Plan:     There are no diagnoses linked to this encounter.  *** - Patient has received significant relief with OMT in the past.  Elects for repeat OMT today.  Tolerated well per note below. - Decision today to treat with OMT was based on Physical Exam   After verbal consent patient was treated with HVLA (high velocity low amplitude), ME (muscle energy), FPR (flex positional release), ST (soft tissue), PC/PD (Pelvic Compression/ Pelvic Decompression) techniques in cervical, rib, thoracic, lumbar, and pelvic areas. Patient tolerated the procedure well with improvement in symptoms.  Patient educated on potential side effects of soreness and recommended to rest, hydrate, and use Tylenol as needed for pain control.   Pertinent previous records reviewed include ***   Follow Up: ***     Subjective:   Tony [name], am serving as a Neurosurgeon for Doctor Richardean Sale  Chief Complaint: OMT follow up    HPI:  01/23/2021 Patient is a 41 year old male complaining of back pain. Patient states He says he was lifting kettle bells yesterday morning and felt a sudden pull in his left lower back.  He has had ongoing pain in his left lower back since that time.  Its worse with movement and standing and trying to turn.  He denies any other injuries.  No radiation down his legs.  No numbness or weakness to his legs. Ed gave him muscle relaxer,    02/07/2021 Patient states that his back has been hurting some , went to [place] and thinks sitting and standing so much the SI area is just tight     03/08/2021 Patient states doing the same     04/01/2021 Patient states that he doing good    04/29/2021 Patient states that hes doing good.    06/04/2021 Patient states hes doing good   07/24/2021 Patient states     Relevant Historical Information: History of bilateral hip labrum  repairs  Additional pertinent review of systems negative.  Current Outpatient Medications  Medication Sig Dispense Refill   albuterol (PROVENTIL HFA;VENTOLIN HFA) 108 (90 Base) MCG/ACT inhaler Take 2 puffs by mouth every 6 hours as needed 18 Inhaler 0   Ascorbic Acid (VITAMIN C) 1000 MG tablet Take 1,000 mg by mouth daily.     EPINEPHrine 0.3 mg/0.3 mL IJ SOAJ injection as directed.     fexofenadine (ALLEGRA) 180 MG tablet Take 180 mg by mouth daily.     fluticasone (FLONASE) 50 MCG/ACT nasal spray Place 2 sprays into the nose daily.     levocetirizine (XYZAL) 5 MG tablet Take 5 mg by mouth daily.     Multiple Vitamin (MULTI-VITAMIN) tablet Take 1 tablet by mouth daily.     Olopatadine HCl 0.6 % SOLN Place into the nose. 2 sprays to each nostril daily     valACYclovir (VALTREX) 1000 MG tablet TAKE 1 TABLET (1,000 MG TOTAL) BY MOUTH DAILY. TAKE FOR 5 DAYS AT FIRST SIGN OF ILLNESS. 90 tablet 1   No current facility-administered medications for this visit.      Objective:     There were no vitals filed for this visit.    There is no height or weight on file to calculate BMI.    Physical Exam:     General: Well-appearing, cooperative, sitting comfortably in no acute distress.   OMT Physical Exam:  ASIS Compression Test:  Positive Right Cervical: TTP paraspinal, *** Rib: Bilateral elevated first rib with TTP Thoracic: TTP paraspinal,*** Lumbar: TTP paraspinal,*** Pelvis: Right anterior innominate  Electronically signed by:  [name] 7:36 AM 07/23/21

## 2021-07-24 ENCOUNTER — Ambulatory Visit: Payer: 59 | Admitting: Sports Medicine

## 2021-07-24 VITALS — BP 120/80 | HR 62 | Ht 73.0 in | Wt 217.0 lb

## 2021-07-24 DIAGNOSIS — M9903 Segmental and somatic dysfunction of lumbar region: Secondary | ICD-10-CM

## 2021-07-24 DIAGNOSIS — M25552 Pain in left hip: Secondary | ICD-10-CM | POA: Diagnosis not present

## 2021-07-24 DIAGNOSIS — G8929 Other chronic pain: Secondary | ICD-10-CM

## 2021-07-24 DIAGNOSIS — M533 Sacrococcygeal disorders, not elsewhere classified: Secondary | ICD-10-CM

## 2021-07-24 DIAGNOSIS — M9904 Segmental and somatic dysfunction of sacral region: Secondary | ICD-10-CM

## 2021-07-24 DIAGNOSIS — M9905 Segmental and somatic dysfunction of pelvic region: Secondary | ICD-10-CM | POA: Diagnosis not present

## 2021-07-24 NOTE — Patient Instructions (Signed)
Good to see you   

## 2021-08-26 ENCOUNTER — Telehealth: Payer: Self-pay | Admitting: Family Medicine

## 2021-08-26 NOTE — Telephone Encounter (Signed)
Patient states: - He has asthma but has been having trouble breathing on and off this summer - Trouble breathing has worsened - Has been using albuterol inhaler but this hasn't helped  As I was transferring patient to triage, patient was disconnected. Triage nurse states she will give him a callback.

## 2021-08-26 NOTE — Telephone Encounter (Addendum)
Patient called and stated he was returning Nurse Heather's call. Called Triage-Triage Nurse at lunch and will call Patient back. Patient disconnected while on hold for Triage.

## 2021-08-26 NOTE — Telephone Encounter (Signed)
Patient Name: Tony [name] Gender: Male DOB: [birth year] Age: 41 Y 1 M 15 D Return Phone Number: [phone] (Primary) Address: City/ State/ Zip: [place]  [address] Client [facility], [facility] Provider Tana Conch- MD Contact Type Call Who Is Calling Patient / Member / Family / Caregiver Call Type Triage / Clinical Caller Name Havlen Relationship To Patient Provider Return Phone Number [phone] (Primary) Chief Complaint BREATHING - shortness of breath or sounds breathless Reason for Call Symptomatic / Request for Health Information Initial Comment Caller states she has a pt with trouble breathing on and off. His inhaler has not been helping him. Translation No Disp. Time Lamount Cohen Time) Disposition Final User 08/26/2021 1:35:04 PM Send to Urgent Felisa Bonier 08/26/2021 1:38:24 PM Attempt made - message left Standifer, RN, Herbert Seta 08/26/2021 2:06:37 PM Clinical Call Yes Scarlette Ar, RN, Herbert Seta Final Disposition 08/26/2021 2:06:37 PM Clinical Call Yes Standifer, RN, Heather Comments User: Tyrone Apple, RN Date/Time Lamount Cohen Time): 08/26/2021 1:38:10 PM Caller hung up before office personnel could transfer him. Will call caller back. User: Tyrone Apple, RN Date/Time Lamount Cohen Time): 08/26/2021 2:06:14 PM Caller states that he was able to get in touch with his allergy office and got it taken care of.

## 2021-08-27 NOTE — Telephone Encounter (Signed)
Glad able to connect with allergist to sort through this

## 2021-08-27 NOTE — Telephone Encounter (Signed)
FYI

## 2021-09-03 ENCOUNTER — Ambulatory Visit: Payer: 59 | Admitting: Family Medicine

## 2021-09-04 NOTE — Progress Notes (Unsigned)
[name] [address] [place] [address] Phone: [phone]   Assessment and Plan:     There are no diagnoses linked to this encounter.  *** - Patient has received significant relief with OMT in the past.  Elects for repeat OMT today.  Tolerated well per note below. - Decision today to treat with OMT was based on Physical Exam   After verbal consent patient was treated with HVLA (high velocity low amplitude), ME (muscle energy), FPR (flex positional release), ST (soft tissue), PC/PD (Pelvic Compression/ Pelvic Decompression) techniques in cervical, rib, thoracic, lumbar, and pelvic areas. Patient tolerated the procedure well with improvement in symptoms.  Patient educated on potential side effects of soreness and recommended to rest, hydrate, and use Tylenol as needed for pain control.   Pertinent previous records reviewed include ***   Follow Up: ***     Subjective:   Tony [name], am serving as a Neurosurgeon for Doctor Richardean Sale  Chief Complaint: OMT follow up    HPI:  01/23/2021 Patient is a 41 year old male complaining of back pain. Patient states He says he was lifting kettle bells yesterday morning and felt a sudden pull in his left lower back.  He has had ongoing pain in his left lower back since that time.  Its worse with movement and standing and trying to turn.  He denies any other injuries.  No radiation down his legs.  No numbness or weakness to his legs. Ed gave him muscle relaxer,    02/07/2021 Patient states that his back has been hurting some , went to [place] and thinks sitting and standing so much the SI area is just tight     03/08/2021 Patient states doing the same     04/01/2021 Patient states that he doing good    04/29/2021 Patient states that hes doing good.    06/04/2021 Patient states hes doing good    07/24/2021 Patient states hips hurting back is sore     09/05/2021 Patient states   Relevant  Historical Information: History of bilateral hip labrum repairs  Additional pertinent review of systems negative.  Current Outpatient Medications  Medication Sig Dispense Refill   albuterol (PROVENTIL HFA;VENTOLIN HFA) 108 (90 Base) MCG/ACT inhaler Take 2 puffs by mouth every 6 hours as needed 18 Inhaler 0   Ascorbic Acid (VITAMIN C) 1000 MG tablet Take 1,000 mg by mouth daily.     EPINEPHrine 0.3 mg/0.3 mL IJ SOAJ injection as directed.     fexofenadine (ALLEGRA) 180 MG tablet Take 180 mg by mouth daily.     fluticasone (FLONASE) 50 MCG/ACT nasal spray Place 2 sprays into the nose daily.     levocetirizine (XYZAL) 5 MG tablet Take 5 mg by mouth daily.     Multiple Vitamin (MULTI-VITAMIN) tablet Take 1 tablet by mouth daily.     Olopatadine HCl 0.6 % SOLN Place into the nose. 2 sprays to each nostril daily     valACYclovir (VALTREX) 1000 MG tablet TAKE 1 TABLET (1,000 MG TOTAL) BY MOUTH DAILY. TAKE FOR 5 DAYS AT FIRST SIGN OF ILLNESS. 90 tablet 1   No current facility-administered medications for this visit.      Objective:     There were no vitals filed for this visit.    There is no height or weight on file to calculate BMI.    Physical Exam:     General: Well-appearing, cooperative, sitting comfortably in no  acute distress.   OMT Physical Exam:  ASIS Compression Test: Positive Right Cervical: TTP paraspinal, *** Rib: Bilateral elevated first rib with TTP Thoracic: TTP paraspinal,*** Lumbar: TTP paraspinal,*** Pelvis: Right anterior innominate  Electronically signed by:  [name] 7:47 AM 09/04/21

## 2021-09-05 ENCOUNTER — Ambulatory Visit: Payer: 59 | Admitting: Sports Medicine

## 2021-09-05 VITALS — BP 122/80 | HR 99 | Ht 73.0 in | Wt 217.0 lb

## 2021-09-05 DIAGNOSIS — M9904 Segmental and somatic dysfunction of sacral region: Secondary | ICD-10-CM | POA: Diagnosis not present

## 2021-09-05 DIAGNOSIS — M9903 Segmental and somatic dysfunction of lumbar region: Secondary | ICD-10-CM | POA: Diagnosis not present

## 2021-09-05 DIAGNOSIS — M9905 Segmental and somatic dysfunction of pelvic region: Secondary | ICD-10-CM | POA: Diagnosis not present

## 2021-09-05 DIAGNOSIS — M533 Sacrococcygeal disorders, not elsewhere classified: Secondary | ICD-10-CM | POA: Diagnosis not present

## 2021-09-05 DIAGNOSIS — G8929 Other chronic pain: Secondary | ICD-10-CM | POA: Diagnosis not present

## 2021-09-05 NOTE — Patient Instructions (Signed)
Good to see you   

## 2021-09-30 ENCOUNTER — Encounter: Payer: Self-pay | Admitting: *Deleted

## 2021-10-16 NOTE — Progress Notes (Signed)
[place] [facility] Phone: [phone] Subjective:   Tony Tony [name], am serving as a scribe for Dr. Hulan Saas.  I'm seeing this patient by the request  of:  Marin Olp, MD  CC: Back and neck pain follow-up  [id number]  Tony Tony [name] is a 41 y.o. male coming in with complaint of back and neck pain. Has been seeing Dr. Glennon Mac for OMT. Last seen in September 2020. Patient states that he has some lower back pain that is intermittent. Tony [name] radiating symptoms. Does not take any medications.   Medications patient has been prescribed: None  Taking:         Reviewed prior external information including notes and imaging from previsou exam, outside providers and external EMR if available.   As well as notes that were available from care everywhere and other healthcare systems.  Past medical history, social, surgical and family history all reviewed in electronic medical record.  Tony [name] pertanent information unless stated regarding to the chief complaint.   Past Medical History:  Diagnosis Date   ALLERGIC RHINITIS 09/17/2009   ASTHMA 09/17/2009   HYPERLIPIDEMIA 09/17/2009    Allergies  Allergen Reactions   Prednisolone Other (See Comments)   Prednisone     Other reaction(s): Other (See Comments), Unknown hiccups      Review of Systems:  Tony [name] headache, visual changes, nausea, vomiting, diarrhea, constipation, dizziness, abdominal pain, skin rash, fevers, chills, night sweats, weight loss, swollen lymph nodes, body aches, joint swelling, chest pain, shortness of breath, mood changes. POSITIVE muscle aches  Objective  Blood pressure (!) 128/92, pulse 87, height 6\' 1"  (1.854 m), weight 228 lb (103.4 kg), SpO2 99 %.   General: Tony [name] apparent distress alert and oriented x3 mood and affect normal, dressed appropriately.  HEENT: Pupils equal, extraocular movements intact  Respiratory: Patient's speak in full  sentences and does not appear short of breath  Cardiovascular: Tony [name] lower extremity edema, non tender, Tony [name] erythema  Gait very minorly antalgic MSK:  Back low back has significant tightness noted more on the left side of the hip.  Patient does have limited range of motion of the hip in both internal and external.  Severe tenderness to palpation more over the overall left sacroiliac joint.  Osteopathic findings   C6 flexed rotated and side bent right T3 extended rotated and side bent right inhaled rib T9 extended rotated and side bent left L2 flexed rotated and side bent right Sacrum right on right       Assessment and Plan:  SI (sacroiliac) joint dysfunction Continues to have some difficulty.  Patient does have some limited range of motion of the left hip likely secondary to some of his surgical intervention he has had for the labral tear.  Test does cause more discomfort of the sacroiliac joint.  We discussed hip abductor strengthening and given code for a online physical therapy to do.  Patient did respond well to osteopathic manipulation.  Working on core strength.  Patient has had a lot of difficulties over the course of the year that has not allowed him to focus on himself.  Follow-up with me again in 6 to 8 weeks    Nonallopathic problems  Decision today to treat with OMT was based on Physical Exam  After verbal consent patient was treated with HVLA, ME, FPR techniques in cervical, rib, thoracic, lumbar, and sacral  areas  Patient tolerated the procedure well with improvement in symptoms  Patient given exercises, stretches and lifestyle modifications  See medications in patient instructions if given  Patient will follow up in 4-8 weeks    The above documentation has been reviewed and is accurate and complete Judi Saa, DO          Note: This dictation was prepared with Dragon dictation along with smaller phrase technology. Any transcriptional errors that  result from this process are unintentional.

## 2021-10-17 ENCOUNTER — Encounter: Payer: Self-pay | Admitting: Family Medicine

## 2021-10-17 ENCOUNTER — Ambulatory Visit: Payer: 59 | Admitting: Family Medicine

## 2021-10-17 VITALS — BP 128/92 | HR 87 | Ht 73.0 in | Wt 228.0 lb

## 2021-10-17 DIAGNOSIS — M9903 Segmental and somatic dysfunction of lumbar region: Secondary | ICD-10-CM | POA: Diagnosis not present

## 2021-10-17 DIAGNOSIS — M9902 Segmental and somatic dysfunction of thoracic region: Secondary | ICD-10-CM | POA: Diagnosis not present

## 2021-10-17 DIAGNOSIS — M9904 Segmental and somatic dysfunction of sacral region: Secondary | ICD-10-CM | POA: Diagnosis not present

## 2021-10-17 DIAGNOSIS — M533 Sacrococcygeal disorders, not elsewhere classified: Secondary | ICD-10-CM

## 2021-10-17 NOTE — Assessment & Plan Note (Signed)
Continues to have some difficulty.  Patient does have some limited range of motion of the left hip likely secondary to some of his surgical intervention he has had for the labral tear.  Test does cause more discomfort of the sacroiliac joint.  We discussed hip abductor strengthening and given code for a online physical therapy to do.  Patient did respond well to osteopathic manipulation.  Working on core strength.  Patient has had a lot of difficulties over the course of the year that has not allowed him to focus on himself.  Follow-up with me again in 6 to 8 weeks

## 2021-10-17 NOTE — Patient Instructions (Signed)
Do prescribed exercises at least 3x a week View at [email or web address]-exercise-code.com using code: [facility]

## 2021-11-11 ENCOUNTER — Encounter: Payer: 59 | Admitting: Family Medicine

## 2021-11-12 ENCOUNTER — Telehealth: Payer: Self-pay | Admitting: Family Medicine

## 2021-11-12 DIAGNOSIS — B009 Herpesviral infection, unspecified: Secondary | ICD-10-CM

## 2021-11-12 DIAGNOSIS — Z113 Encounter for screening for infections with a predominantly sexual mode of transmission: Secondary | ICD-10-CM

## 2021-11-12 DIAGNOSIS — Z0000 Encounter for general adult medical examination without abnormal findings: Secondary | ICD-10-CM

## 2021-11-12 DIAGNOSIS — Z114 Encounter for screening for human immunodeficiency virus [HIV]: Secondary | ICD-10-CM

## 2021-11-12 DIAGNOSIS — E785 Hyperlipidemia, unspecified: Secondary | ICD-10-CM

## 2021-11-12 NOTE — Telephone Encounter (Signed)
LVM to call back to schedule Lab appointment  per:   Message Received: Today [place], [place] A, [place] Admin Please schedule lab visit for labs from last year.

## 2021-11-22 NOTE — Telephone Encounter (Signed)
LVM x 2 to call back to schedule Lab appointment

## 2021-11-25 NOTE — Progress Notes (Deleted)
  [name] [address] [place] [address] Phone: [phone] Subjective:    I'm seeing this patient by the request  of:  Shelva Majestic, MD  CC:   [id number]  Tony [name] is a 41 y.o. male coming in with complaint of back and neck pain. OMT 10/17/2021. Patient states   Medications patient has been prescribed: None  Taking:         Reviewed prior external information including notes and imaging from previsou exam, outside providers and external EMR if available.   As well as notes that were available from care everywhere and other healthcare systems.  Past medical history, social, surgical and family history all reviewed in electronic medical record.  No pertanent information unless stated regarding to the chief complaint.   Past Medical History:  Diagnosis Date   ALLERGIC RHINITIS 09/17/2009   ASTHMA 09/17/2009   HYPERLIPIDEMIA 09/17/2009    Allergies  Allergen Reactions   Prednisolone Other (See Comments)   Prednisone     Other reaction(s): Other (See Comments), Unknown hiccups      Review of Systems:  No headache, visual changes, nausea, vomiting, diarrhea, constipation, dizziness, abdominal pain, skin rash, fevers, chills, night sweats, weight loss, swollen lymph nodes, body aches, joint swelling, chest pain, shortness of breath, mood changes. POSITIVE muscle aches  Objective  There were no vitals taken for this visit.   General: No apparent distress alert and oriented x3 mood and affect normal, dressed appropriately.  HEENT: Pupils equal, extraocular movements intact  Respiratory: Patient's speak in full sentences and does not appear short of breath  Cardiovascular: No lower extremity edema, non tender, no erythema  Gait MSK:  Back   Osteopathic findings  C2 flexed rotated and side bent right C6 flexed rotated and side bent left T3 extended rotated and side bent right inhaled rib T9 extended rotated  and side bent left L2 flexed rotated and side bent right Sacrum right on right       Assessment and Plan:  No problem-specific Assessment & Plan notes found for this encounter.    Nonallopathic problems  Decision today to treat with OMT was based on Physical Exam  After verbal consent patient was treated with HVLA, ME, FPR techniques in cervical, rib, thoracic, lumbar, and sacral  areas  Patient tolerated the procedure well with improvement in symptoms  Patient given exercises, stretches and lifestyle modifications  See medications in patient instructions if given  Patient will follow up in 4-8 weeks             Note: This dictation was prepared with Dragon dictation along with smaller phrase technology. Any transcriptional errors that result from this process are unintentional.

## 2021-11-26 NOTE — Telephone Encounter (Signed)
Called Patient-Patient declined Lab appointment-stated he will call back when he makes an appointment for his next Physical and will have labs drawn at that time

## 2021-11-26 NOTE — Telephone Encounter (Signed)
FYI

## 2021-11-26 NOTE — Telephone Encounter (Signed)
FYI unsuccessful reaching pt.  Future labs ordered.

## 2021-11-26 NOTE — Telephone Encounter (Signed)
Sounds good

## 2021-11-26 NOTE — Telephone Encounter (Signed)
FYI, I do not see any active Lab Orders for Patient. I have left 2 voice mails for Patient to call back to schedule Lab appointment with no response.

## 2021-12-03 ENCOUNTER — Ambulatory Visit: Payer: 59 | Admitting: Family Medicine

## 2021-12-19 ENCOUNTER — Encounter: Payer: Self-pay | Admitting: *Deleted

## 2022-02-04 ENCOUNTER — Encounter: Payer: Self-pay | Admitting: Family Medicine

## 2022-02-04 NOTE — Telephone Encounter (Signed)
Patient requesting a call from manager Ms Charlie Pitter.

## 2022-02-05 ENCOUNTER — Ambulatory Visit: Payer: 59 | Admitting: Family Medicine

## 2022-02-13 ENCOUNTER — Telehealth (INDEPENDENT_AMBULATORY_CARE_PROVIDER_SITE_OTHER): Payer: 59 | Admitting: Family Medicine

## 2022-02-13 ENCOUNTER — Encounter: Payer: Self-pay | Admitting: Family Medicine

## 2022-02-13 VITALS — BP 124/92 | HR 80 | Temp 97.6°F | Ht 73.0 in | Wt 231.8 lb

## 2022-02-13 DIAGNOSIS — F4321 Adjustment disorder with depressed mood: Secondary | ICD-10-CM | POA: Diagnosis not present

## 2022-02-13 DIAGNOSIS — F439 Reaction to severe stress, unspecified: Secondary | ICD-10-CM

## 2022-02-13 DIAGNOSIS — M25532 Pain in left wrist: Secondary | ICD-10-CM | POA: Diagnosis not present

## 2022-02-13 MED ORDER — ESCITALOPRAM OXALATE 10 MG PO TABS: 10.0000 mg | ORAL_TABLET | Freq: Every day | ORAL | 5 refills | Status: DC

## 2022-02-13 MED ORDER — BUPROPION HCL ER (XL) 150 MG PO TB24: 150.0000 mg | ORAL_TABLET | Freq: Every day | ORAL | 5 refills | Status: DC

## 2022-02-13 NOTE — Progress Notes (Signed)
Phone [phone] In person visit   Subjective:   Tony [name] is a 42 y.o. year old very pleasant male patient who presents for/with See problem oriented charting Chief Complaint  Patient presents with   sleeping issues    Pt c/o sleeping issues that started 8-34months ago staying asleep.   spot on left wrist    Past Medical History-  Patient Active Problem List   Diagnosis Date Noted   Dyslipidemia 09/17/2009    Priority: Medium    Allergic rhinitis 09/17/2009    Priority: Medium    Asthma 09/17/2009    Priority: Medium    IBS-D 10/11/2015    Priority: Low   Labral tear of right hip joint 01/28/2018   Right hip pain 10/13/2017   Quadriceps tendinitis 11/06/2015   SI (sacroiliac) joint dysfunction 11/06/2015   Nonallopathic lesion of sacral region 11/06/2015   Nonallopathic lesion of lumbosacral region 11/06/2015   Nonallopathic lesion of thoracic region 11/06/2015    Medications- reviewed and updated Current Outpatient Medications  Medication Sig Dispense Refill   buPROPion (WELLBUTRIN XL) 150 MG 24 hr tablet Take 1 tablet (150 mg total) by mouth daily. 30 tablet 5   pantoprazole (PROTONIX) 40 MG tablet Take 40 mg by mouth daily.     albuterol (PROVENTIL HFA;VENTOLIN HFA) 108 (90 Base) MCG/ACT inhaler Take 2 puffs by mouth every 6 hours as needed 18 Inhaler 0   Ascorbic Acid (VITAMIN C) 1000 MG tablet Take 1,000 mg by mouth daily.     EPINEPHrine 0.3 mg/0.3 mL IJ SOAJ injection as directed.     fexofenadine (ALLEGRA) 180 MG tablet Take 180 mg by mouth daily.     fluticasone (FLONASE) 50 MCG/ACT nasal spray Place 2 sprays into the nose daily.     levocetirizine (XYZAL) 5 MG tablet Take 5 mg by mouth daily.     Multiple Vitamin (MULTI-VITAMIN) tablet Take 1 tablet by mouth daily.     Olopatadine HCl 0.6 % SOLN Place into the nose. 2 sprays to each nostril daily     No current facility-administered medications for this visit.     Objective:  BP (!) 128/98    Pulse 80   Temp 97.6 F (36.4 C)   Ht 6\' 1"  (1.854 m)   Wt 231 lb 12.8 oz (105.1 kg)   SpO2 99%   BMI 30.58 kg/m  Gen: NAD, resting comfortably     Assessment and Plan   # Stress along with poor sleep S: Patient reports started having issues with sleep 8 or 9 months ago.  He seems to be able to fall asleep but has difficulty staying asleep.  He has had intense stress since 2022 separation from wife, dealing with custody, and then he lost his father July 2023 to pneumonia. [name] or melatonin helps some. Does in general feels down and notes a lot of stress related to life changes    He has started with therapy with [facility]- 3 sessions in. 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   Therapist had mentioned SSRI class.     02/13/2022    8:05 AM 11/15/2020   11:47 AM 11/09/2019    8:25 AM  Depression screen PHQ 2/9  Decreased Interest 0 0 0  Down, Depressed, Hopeless 1 1 0  PHQ - 2 Score 1 1 0  Altered sleeping 3    Tired, decreased energy 1    Change in appetite 0  Feeling bad or failure about yourself  1    Trouble concentrating 1    Moving slowly or fidgety/restless 0    Suicidal thoughts 0    PHQ-9 Score 7    Difficult doing work/chores Somewhat difficult    A/P: situational stress and mourning loaded together with major life changes (loss of father, dealing with divorce process). We opted to trial wellbutrin 150mg  extended release in addition to EMDR therapy he is working on. Check back in 2 months from now -if anxiety peaks too much with this- we discussed lexapro 5-10 mg as alternate but wanted to avoid sexual side effects and potential weight gain of SSIR if possible  -he also wants to schedule cpe and we will do labs then -in the end later changed mind and opts for lexapro  #left wrist pain- noted at least several months- feels small bulge- possible ganglion cyst- has had great experience  with Dr. Glennon Mac- will refer for his expert opinion.   Recommended follow up: Return in about 2 months (around 04/14/2022) for followup or sooner if needed.Schedule b4 you leave.  Lab/Order associations:   ICD-10-CM   1. Situational stress  F43.9     2. Mourning  F43.21       Meds ordered this encounter  Medications   buPROPion (WELLBUTRIN XL) 150 MG 24 hr tablet    Sig: Take 1 tablet (150 mg total) by mouth daily.    Dispense:  30 tablet    Refill:  5   Time Spent: 32 minutes of total time (8:10 AM- 8:42 AM) was spent on the date of the encounter performing the following actions: chart review prior to seeing the patient, obtaining history,  counseling on the treatment options/plan, placing orders, and documenting in our EHR.   Return precautions advised.  Garret Reddish, MD

## 2022-02-13 NOTE — Patient Instructions (Addendum)
Tdap today   situational stress and mourning loaded together with major life changes (loss of father, dealing with divorce process). We opted to trial wellbutrin 150mg  extended release in addition to EMDR therapy he is working on. Check back in 2 months from now -if anxiety peaks too much with this- we discussed lexapro 5-10 mg as alternate but wanted to avoid sexual side effects and potential weight gain of SSRI -in the end later changed mind and opts for lexapro   Taking the lexapro 10 mg (half tablet first week) as directed and not missing any doses is one of the best things you can do to treat situational stress/down mood.  Here are some things to keep in mind:  Side effects (stomach upset, some increased anxiety) may happen before you notice a benefit.  These side effects typically go away over time. Changes to your dose of medicine or a change in medication all together is sometimes necessary Most people need to be on medication at least 6-12 months Many people will notice an improvement within two weeks but the full effect of the medication can take up to 6 weeks Stopping the medication when you start feeling better often results in a return of symptoms If you start having thoughts of hurting yourself or others after starting this medicine, call our office immediately at [phone] or seek care through [phone] or [phone]   Recommended follow up: Return in about 2 months (around 04/14/2022) for followup or sooner if needed.Schedule b4 you leave.

## 2022-02-18 NOTE — Progress Notes (Unsigned)
    Tony [name] D.[facility] [place] Phone: [phone]   Assessment and Plan:     There are no diagnoses linked to this encounter.  ***   Pertinent previous records reviewed include ***   Follow Up: ***     Subjective:   I, Tony [name], am serving as a Education administrator for Doctor Glennon Mac  Chief Complaint: left wrist pain   HPI:   02/19/2022 Patient is a 42 year old male complaining of left wrist pain. Patient states   Relevant Historical Information: ***  Additional pertinent review of systems negative.   Current Outpatient Medications:    albuterol (PROVENTIL HFA;VENTOLIN HFA) 108 (90 Base) MCG/ACT inhaler, Take 2 puffs by mouth every 6 hours as needed, Disp: 18 Inhaler, Rfl: 0   Ascorbic Acid (VITAMIN C) 1000 MG tablet, Take 1,000 mg by mouth daily., Disp: , Rfl:    buPROPion (WELLBUTRIN XL) 150 MG 24 hr tablet, Take 1 tablet (150 mg total) by mouth daily., Disp: 30 tablet, Rfl: 5   EPINEPHrine 0.3 mg/0.3 mL IJ SOAJ injection, as directed., Disp: , Rfl:    escitalopram (LEXAPRO) 10 MG tablet, Take 1 tablet (10 mg total) by mouth daily., Disp: 30 tablet, Rfl: 5   fexofenadine (ALLEGRA) 180 MG tablet, Take 180 mg by mouth daily., Disp: , Rfl:    fluticasone (FLONASE) 50 MCG/ACT nasal spray, Place 2 sprays into the nose daily., Disp: , Rfl:    levocetirizine (XYZAL) 5 MG tablet, Take 5 mg by mouth daily., Disp: , Rfl:    Multiple Vitamin (MULTI-VITAMIN) tablet, Take 1 tablet by mouth daily., Disp: , Rfl:    Olopatadine HCl 0.6 % SOLN, Place into the nose. 2 sprays to each nostril daily, Disp: , Rfl:    pantoprazole (PROTONIX) 40 MG tablet, Take 40 mg by mouth daily., Disp: , Rfl:    Objective:     There were no vitals filed for this visit.    There is no height or weight on file to calculate BMI.    Physical Exam:    ***   Electronically signed by:  Tony [name] [facility] 7:39 AM 02/18/22

## 2022-02-19 ENCOUNTER — Ambulatory Visit (INDEPENDENT_AMBULATORY_CARE_PROVIDER_SITE_OTHER): Payer: 59

## 2022-02-19 ENCOUNTER — Ambulatory Visit: Payer: 59 | Admitting: Sports Medicine

## 2022-02-19 VITALS — BP 132/82 | HR 93 | Ht 73.0 in | Wt 231.0 lb

## 2022-02-19 DIAGNOSIS — M533 Sacrococcygeal disorders, not elsewhere classified: Secondary | ICD-10-CM | POA: Diagnosis not present

## 2022-02-19 DIAGNOSIS — M9904 Segmental and somatic dysfunction of sacral region: Secondary | ICD-10-CM | POA: Diagnosis not present

## 2022-02-19 DIAGNOSIS — M9903 Segmental and somatic dysfunction of lumbar region: Secondary | ICD-10-CM | POA: Diagnosis not present

## 2022-02-19 DIAGNOSIS — G8929 Other chronic pain: Secondary | ICD-10-CM

## 2022-02-19 DIAGNOSIS — M25532 Pain in left wrist: Secondary | ICD-10-CM | POA: Diagnosis not present

## 2022-02-19 DIAGNOSIS — S66819A Strain of other specified muscles, fascia and tendons at wrist and hand level, unspecified hand, initial encounter: Secondary | ICD-10-CM

## 2022-02-19 DIAGNOSIS — M9905 Segmental and somatic dysfunction of pelvic region: Secondary | ICD-10-CM | POA: Diagnosis not present

## 2022-02-19 MED ORDER — MELOXICAM 15 MG PO TABS: 15.0000 mg | ORAL_TABLET | Freq: Every day | ORAL | 0 refills | Status: DC

## 2022-02-19 NOTE — Patient Instructions (Addendum)
Good to see you  - Start meloxicam 15 mg daily x2 weeks.  If still having pain after 2 weeks, complete 3rd-week of meloxicam. May use remaining meloxicam as needed once daily for pain control.  Do not to use additional NSAIDs while taking meloxicam.  May use Tylenol [phone] mg 2 to 3 times a day for breakthrough pain. 4 week follow up

## 2022-03-11 ENCOUNTER — Ambulatory Visit (INDEPENDENT_AMBULATORY_CARE_PROVIDER_SITE_OTHER): Payer: 59

## 2022-03-11 DIAGNOSIS — Z23 Encounter for immunization: Secondary | ICD-10-CM

## 2022-03-11 NOTE — Progress Notes (Signed)
Pt in the office for TDAP immunization and advised potential side effects. Pt states he is already aware of these and has no concerns. Administered in right deltoid with no issues.

## 2022-03-18 NOTE — Progress Notes (Unsigned)
  Tony [name] [place] [address] [place] Phone: [phone] Subjective:   Tony [name], am serving as a scribe for Dr. Hulan Saas.  I'm seeing this patient by the request  of:  Marin Olp, MD  CC:  Low back pain [id number]  Tony [name] is a 42 y.o. male coming in with complaint of back pain. OMT 02/19/2022. Also f/u for L wrist pain. Patient states wrist feels fine. Here for manipulation. No new concerns.  Medications patient has been prescribed: None  Taking:         Reviewed prior external information including notes and imaging from previsou exam, outside providers and external EMR if available.   As well as notes that were available from care everywhere and other healthcare systems.  Past medical history, social, surgical and family history all reviewed in electronic medical record.  No pertanent information unless stated regarding to the chief complaint.   Past Medical History:  Diagnosis Date   ALLERGIC RHINITIS 09/17/2009   ASTHMA 09/17/2009   HYPERLIPIDEMIA 09/17/2009    Allergies  Allergen Reactions   Prednisolone Other (See Comments)   Prednisone     Other reaction(s): Other (See Comments), Unknown hiccups      Review of Systems:  No headache, visual changes, nausea, vomiting, diarrhea, constipation, dizziness, abdominal pain, skin rash, fevers, chills, night sweats, weight loss, swollen lymph nodes, body aches, joint swelling, chest pain, shortness of breath, mood changes. POSITIVE muscle aches  Objective  Blood pressure (!) 120/90, pulse 88, height 6\' 1"  (1.854 m), weight 237 lb (107.5 kg), SpO2 98 %.   General: No apparent distress alert and oriented x3 mood and affect normal, dressed appropriately.  HEENT: Pupils equal, extraocular movements intact  Respiratory: Patient's speak in full sentences and does not appear short of breath  Cardiovascular: No lower extremity edema, non tender, no  erythema  Low back exam does have some loss of lordosis noted.  Patient has significant tenderness around the sacroiliac joint bilaterally, low back does have a limitation in extension negative straight leg test but does have tightness of the hamstrings  Osteopathic findings   T7 extended rotated and side bent left L3 flexed rotated and side bent right Sacrum right on right       Assessment and Plan:  SI (sacroiliac) joint dysfunction Chronic problem with exacerbation.  Attempted oral anti-inflammatories and had more GI discomfort.  Discussed topical anti-inflammatories that can be helpful, continue to work on core strengthening.  Discussed icing regimen.  Increase activity slowly.  Patient does do a lot of sitting and other activity.  Follow-up with me again in 6 to 8 weeks    Nonallopathic problems  Decision today to treat with OMT was based on Physical Exam  After verbal consent patient was treated with HVLA, ME, FPR techniques in thoracic, lumbar, and sacral  areas  Patient tolerated the procedure well with improvement in symptoms  Patient given exercises, stretches and lifestyle modifications  See medications in patient instructions if given  Patient will follow up in 4-8 weeks    The above documentation has been reviewed and is accurate and complete Lyndal Pulley, DO          Note: This dictation was prepared with Dragon dictation along with smaller phrase technology. Any transcriptional errors that result from this process are unintentional.

## 2022-03-20 ENCOUNTER — Ambulatory Visit: Payer: 59 | Admitting: Family Medicine

## 2022-03-20 ENCOUNTER — Other Ambulatory Visit: Payer: Self-pay

## 2022-03-20 ENCOUNTER — Encounter: Payer: Self-pay | Admitting: Family Medicine

## 2022-03-20 VITALS — BP 120/90 | HR 88 | Ht 73.0 in | Wt 237.0 lb

## 2022-03-20 DIAGNOSIS — M9903 Segmental and somatic dysfunction of lumbar region: Secondary | ICD-10-CM

## 2022-03-20 DIAGNOSIS — M533 Sacrococcygeal disorders, not elsewhere classified: Secondary | ICD-10-CM

## 2022-03-20 DIAGNOSIS — M9904 Segmental and somatic dysfunction of sacral region: Secondary | ICD-10-CM

## 2022-03-20 DIAGNOSIS — M9902 Segmental and somatic dysfunction of thoracic region: Secondary | ICD-10-CM

## 2022-03-20 DIAGNOSIS — M25532 Pain in left wrist: Secondary | ICD-10-CM

## 2022-03-20 DIAGNOSIS — M9908 Segmental and somatic dysfunction of rib cage: Secondary | ICD-10-CM | POA: Diagnosis not present

## 2022-03-20 NOTE — Assessment & Plan Note (Signed)
Chronic problem with exacerbation.  Attempted oral anti-inflammatories and had more GI discomfort.  Discussed topical anti-inflammatories that can be helpful, continue to work on core strengthening.  Discussed icing regimen.  Increase activity slowly.  Patient does do a lot of sitting and other activity.  Follow-up with me again in 6 to 8 weeks

## 2022-03-20 NOTE — Patient Instructions (Signed)
Voltaren or Arnica gel Good to see you! Keep working on stretches See me or Dr. Glennon Mac in 6-8 weeks

## 2022-05-01 NOTE — Progress Notes (Deleted)
  [name] [address] [place] [address] Phone: [phone] Subjective:    I'm seeing this patient by the request  of:  Shelva Majestic, MD  CC:   [name]  Tony [name] is a 42 y.o. male coming in with complaint of back and neck pain. OMT 03/20/2022. Patient states   Medications patient has been prescribed: None  Taking:         Reviewed prior external information including notes and imaging from previsou exam, outside providers and external EMR if available.   As well as notes that were available from care everywhere and other healthcare systems.  Past medical history, social, surgical and family history all reviewed in electronic medical record.  No pertanent information unless stated regarding to the chief complaint.   Past Medical History:  Diagnosis Date   ALLERGIC RHINITIS 09/17/2009   ASTHMA 09/17/2009   HYPERLIPIDEMIA 09/17/2009    Allergies  Allergen Reactions   Prednisolone Other (See Comments)   Prednisone     Other reaction(s): Other (See Comments), Unknown hiccups      Review of Systems:  No headache, visual changes, nausea, vomiting, diarrhea, constipation, dizziness, abdominal pain, skin rash, fevers, chills, night sweats, weight loss, swollen lymph nodes, body aches, joint swelling, chest pain, shortness of breath, mood changes. POSITIVE muscle aches  Objective  There were no vitals taken for this visit.   General: No apparent distress alert and oriented x3 mood and affect normal, dressed appropriately.  HEENT: Pupils equal, extraocular movements intact  Respiratory: Patient's speak in full sentences and does not appear short of breath  Cardiovascular: No lower extremity edema, non tender, no erythema  Gait MSK:  Back   Osteopathic findings  C2 flexed rotated and side bent right C6 flexed rotated and side bent left T3 extended rotated and side bent right inhaled rib T9 extended rotated  and side bent left L2 flexed rotated and side bent right Sacrum right on right       Assessment and Plan:  No problem-specific Assessment & Plan notes found for this encounter.    Nonallopathic problems  Decision today to treat with OMT was based on Physical Exam  After verbal consent patient was treated with HVLA, ME, FPR techniques in cervical, rib, thoracic, lumbar, and sacral  areas  Patient tolerated the procedure well with improvement in symptoms  Patient given exercises, stretches and lifestyle modifications  See medications in patient instructions if given  Patient will follow up in 4-8 weeks             Note: This dictation was prepared with Dragon dictation along with smaller phrase technology. Any transcriptional errors that result from this process are unintentional.

## 2022-05-07 ENCOUNTER — Ambulatory Visit: Payer: 59 | Admitting: Family Medicine

## 2022-05-12 NOTE — Progress Notes (Unsigned)
    Tony [name] D.[name] [address] [place] [address] Phone: [phone]   Assessment and Plan:     There are no diagnoses linked to this encounter.  ***   Pertinent previous records reviewed include ***   Follow Up: ***     Subjective:   I, Tony [name], am serving as a Neurosurgeon for Doctor Tony [name]   Chief Complaint: left wrist pain    HPI:    02/19/2022 Patient is a 42 year old male complaining of left wrist pain. Patient states he has had [ain for a while he has a little bulge at the start of his palm and goes into his palm/ thumb area, has pain when squeezing, no numbness and tingling, no decrease in grip strength but will get weak when he is holding on to something , had some discomfort when shooting with left hand , Advil for the pain intermittent relief, no radiating pain, would like an adjustment as well    03/20/2022 Tony [name] is a 42 y.o. male coming in with complaint of back pain. OMT 02/19/2022. Also f/u for L wrist pain. Patient states wrist feels fine. Here for manipulation. No new concerns.   Relevant Historical Information: History left hip labral repair   05/13/2022 Patient states   Additional pertinent review of systems negative.   Current Outpatient Medications:    albuterol (PROVENTIL HFA;VENTOLIN HFA) 108 (90 Base) MCG/ACT inhaler, Take 2 puffs by mouth every 6 hours as needed, Disp: 18 Inhaler, Rfl: 0   Ascorbic Acid (VITAMIN C) 1000 MG tablet, Take 1,000 mg by mouth daily., Disp: , Rfl:    buPROPion (WELLBUTRIN XL) 150 MG 24 hr tablet, Take 1 tablet (150 mg total) by mouth daily., Disp: 30 tablet, Rfl: 5   EPINEPHrine 0.3 mg/0.3 mL IJ SOAJ injection, as directed., Disp: , Rfl:    escitalopram (LEXAPRO) 10 MG tablet, Take 1 tablet (10 mg total) by mouth daily., Disp: 30 tablet, Rfl: 5   fexofenadine (ALLEGRA) 180 MG tablet, Take 180 mg by mouth daily., Disp: , Rfl:    fluticasone (FLONASE) 50  MCG/ACT nasal spray, Place 2 sprays into the nose daily., Disp: , Rfl:    levocetirizine (XYZAL) 5 MG tablet, Take 5 mg by mouth daily., Disp: , Rfl:    meloxicam (MOBIC) 15 MG tablet, Take 1 tablet (15 mg total) by mouth daily., Disp: 30 tablet, Rfl: 0   Multiple Vitamin (MULTI-VITAMIN) tablet, Take 1 tablet by mouth daily., Disp: , Rfl:    Olopatadine HCl 0.6 % SOLN, Place into the nose. 2 sprays to each nostril daily, Disp: , Rfl:    pantoprazole (PROTONIX) 40 MG tablet, Take 40 mg by mouth daily., Disp: , Rfl:    Objective:     There were no vitals filed for this visit.    There is no height or weight on file to calculate BMI.    Physical Exam:    ***   Electronically signed by:  Tony [name] D.[name] 11:54 AM 05/12/22

## 2022-05-13 ENCOUNTER — Ambulatory Visit: Payer: 59 | Admitting: Sports Medicine

## 2022-05-13 VITALS — BP 110/80 | HR 90 | Ht 73.0 in | Wt 232.0 lb

## 2022-05-13 DIAGNOSIS — M533 Sacrococcygeal disorders, not elsewhere classified: Secondary | ICD-10-CM

## 2022-05-13 DIAGNOSIS — M7062 Trochanteric bursitis, left hip: Secondary | ICD-10-CM

## 2022-05-13 DIAGNOSIS — M9903 Segmental and somatic dysfunction of lumbar region: Secondary | ICD-10-CM | POA: Diagnosis not present

## 2022-05-13 DIAGNOSIS — M9905 Segmental and somatic dysfunction of pelvic region: Secondary | ICD-10-CM | POA: Diagnosis not present

## 2022-05-13 NOTE — Patient Instructions (Addendum)
Good to see you Hip HEP Recommend using Voltaren gel over areas of pain 4 week follow up

## 2022-06-06 NOTE — Progress Notes (Unsigned)
  Zach Steffanie Mingle D.O.  Sports Medicine [address] [place] [address] Phone: [phone] Subjective:    I'm seeing this patient by the request  of:  Hunter, Stephen O, MD  CC:   HPI:Subjective  Tony [name] is a 41 y.o. male coming in with complaint of back and neck pain. OMT 03/20/2022. Patient states   Medications patient has been prescribed: None  Taking:         Reviewed prior external information including notes and imaging from previsou exam, outside providers and external EMR if available.   As well as notes that were available from care everywhere and other healthcare systems.  Past medical history, social, surgical and family history all reviewed in electronic medical record.  No pertanent information unless stated regarding to the chief complaint.   Past Medical History:  Diagnosis Date   ALLERGIC RHINITIS 09/17/2009   ASTHMA 09/17/2009   HYPERLIPIDEMIA 09/17/2009    Allergies  Allergen Reactions   Prednisolone Other (See Comments)   Prednisone     Other reaction(s): Other (See Comments), Unknown hiccups      Review of Systems:  No headache, visual changes, nausea, vomiting, diarrhea, constipation, dizziness, abdominal pain, skin rash, fevers, chills, night sweats, weight loss, swollen lymph nodes, body aches, joint swelling, chest pain, shortness of breath, mood changes. POSITIVE muscle aches  Objective  There were no vitals taken for this visit.   General: No apparent distress alert and oriented x3 mood and affect normal, dressed appropriately.  HEENT: Pupils equal, extraocular movements intact  Respiratory: Patient's speak in full sentences and does not appear short of breath  Cardiovascular: No lower extremity edema, non tender, no erythema  Gait MSK:  Back   Osteopathic findings  C2 flexed rotated and side bent right C6 flexed rotated and side bent left T3 extended rotated and side bent right inhaled rib T9 extended rotated  and side bent left L2 flexed rotated and side bent right Sacrum right on right       Assessment and Plan:  No problem-specific Assessment & Plan notes found for this encounter.    Nonallopathic problems  Decision today to treat with OMT was based on Physical Exam  After verbal consent patient was treated with HVLA, ME, FPR techniques in cervical, rib, thoracic, lumbar, and sacral  areas  Patient tolerated the procedure well with improvement in symptoms  Patient given exercises, stretches and lifestyle modifications  See medications in patient instructions if given  Patient will follow up in 4-8 weeks             Note: This dictation was prepared with Dragon dictation along with smaller phrase technology. Any transcriptional errors that result from this process are unintentional.         

## 2022-06-09 ENCOUNTER — Ambulatory Visit (INDEPENDENT_AMBULATORY_CARE_PROVIDER_SITE_OTHER): Payer: 59 | Admitting: Family Medicine

## 2022-06-09 ENCOUNTER — Encounter: Payer: Self-pay | Admitting: Family Medicine

## 2022-06-09 VITALS — BP 102/74 | HR 91 | Ht 73.0 in | Wt 231.0 lb

## 2022-06-09 DIAGNOSIS — M9902 Segmental and somatic dysfunction of thoracic region: Secondary | ICD-10-CM | POA: Diagnosis not present

## 2022-06-09 DIAGNOSIS — M9903 Segmental and somatic dysfunction of lumbar region: Secondary | ICD-10-CM

## 2022-06-09 DIAGNOSIS — M533 Sacrococcygeal disorders, not elsewhere classified: Secondary | ICD-10-CM | POA: Diagnosis not present

## 2022-06-09 DIAGNOSIS — M9904 Segmental and somatic dysfunction of sacral region: Secondary | ICD-10-CM

## 2022-06-09 NOTE — Assessment & Plan Note (Signed)
Patient does have some tenderness to palpation in the paraspinal musculature still noted.  Will be working on core strength.  Discussed hip abductor strength.  Patient does complain of some radicular symptoms from time to time that we will monitor.  Follow-up with me again in 6 to 8 weeks no change in medications.  Right

## 2022-07-07 ENCOUNTER — Encounter: Payer: 59 | Admitting: Family Medicine

## 2022-08-07 NOTE — Progress Notes (Unsigned)
  [name] [address] [place] [address] Phone: [phone] Subjective:   [name], am serving as a scribe for Dr. Antoine Primas.  I'm seeing this patient by the request  of:  Shelva Majestic, MD  CC: Back and neck pain will follow-up  XLK:[id number]  Tony [name] is a 42 y.o. male coming in with complaint of back and neck pain.OMT 06/09/2022. Patient states same per usual. No new concerns.  Medications patient has been prescribed: None  Taking:         Reviewed prior external information including notes and imaging from previsou exam, outside providers and external EMR if available.   As well as notes that were available from care everywhere and other healthcare systems.  Past medical history, social, surgical and family history all reviewed in electronic medical record.  No pertanent information unless stated regarding to the chief complaint.   Past Medical History:  Diagnosis Date   ALLERGIC RHINITIS 09/17/2009   ASTHMA 09/17/2009   HYPERLIPIDEMIA 09/17/2009    Allergies  Allergen Reactions   Prednisolone Other (See Comments)   Prednisone     Other reaction(s): Other (See Comments), Unknown hiccups      Review of Systems:  No headache, visual changes, nausea, vomiting, diarrhea, constipation, dizziness, abdominal pain, skin rash, fevers, chills, night sweats, weight loss, swollen lymph nodes, body aches, joint swelling, chest pain, shortness of breath, mood changes. POSITIVE muscle aches  Objective  Blood pressure 110/84, pulse 82, height 6\' 1"  (1.854 m), weight 232 lb (105.2 kg), SpO2 98%.   General: No apparent distress alert and oriented x3 mood and affect normal, dressed appropriately.  HEENT: Pupils equal, extraocular movements intact  Respiratory: Patient's speak in full sentences and does not appear short of breath  Cardiovascular: No lower extremity edema, non tender, no erythema  Gait MSK:  Back does  have some significant loss of lordosis.  Patient does still have some tightness noted in the paraspinal musculature.  Does have some limited range of motion of the hips right greater than left.  Osteopathic findings T9 extended rotated and side bent left L2 flexed rotated and side bent right L3 flexed rotated and side bent left Sacrum right on right       Assessment and Plan:  Right hip pain Reportedly worsening pain.  In the causing more discomfort along the sacroiliac joint as well.  Does respond well to osteopathic manipulation.  Concerning for this pain.  Patient is going to he could consider following up with the orthopedic surgeon about the labral tear again and if anything such as replacement is necessary.  Discussed continuing to monitor weight and icing regimen.  Follow-up again in 6 to 8 weeks    Nonallopathic problems  Decision today to treat with OMT was based on Physical Exam  After verbal consent patient was treated with HVLA, ME, FPR techniques in  thoracic, lumbar, and sacral  areas  Patient tolerated the procedure well with improvement in symptoms  Patient given exercises, stretches and lifestyle modifications  See medications in patient instructions if given  Patient will follow up in 4-8 weeks    The above documentation has been reviewed and is accurate and complete Judi Saa, DO          Note: This dictation was prepared with Dragon dictation along with smaller phrase technology. Any transcriptional errors that result from this process are unintentional.

## 2022-08-12 ENCOUNTER — Ambulatory Visit: Payer: 59 | Admitting: Family Medicine

## 2022-08-12 ENCOUNTER — Encounter: Payer: Self-pay | Admitting: Family Medicine

## 2022-08-12 VITALS — BP 110/84 | HR 82 | Ht 73.0 in | Wt 232.0 lb

## 2022-08-12 DIAGNOSIS — M9902 Segmental and somatic dysfunction of thoracic region: Secondary | ICD-10-CM | POA: Diagnosis not present

## 2022-08-12 DIAGNOSIS — M25551 Pain in right hip: Secondary | ICD-10-CM

## 2022-08-12 DIAGNOSIS — M9904 Segmental and somatic dysfunction of sacral region: Secondary | ICD-10-CM | POA: Diagnosis not present

## 2022-08-12 DIAGNOSIS — M9903 Segmental and somatic dysfunction of lumbar region: Secondary | ICD-10-CM

## 2022-08-12 NOTE — Assessment & Plan Note (Signed)
Reportedly worsening pain.  In the causing more discomfort along the sacroiliac joint as well.  Does respond well to osteopathic manipulation.  Concerning for this pain.  Patient is going to he could consider following up with the orthopedic surgeon about the labral tear again and if anything such as replacement is necessary.  Discussed continuing to monitor weight and icing regimen.  Follow-up again in 6 to 8 weeks

## 2022-08-12 NOTE — Patient Instructions (Signed)
Good to see you! Keep watching hip See you again in 4 weeks okay to double

## 2022-09-03 NOTE — Progress Notes (Signed)
  [name] [address] [place] [address] Phone: [phone] Subjective:   Tony [name], am serving as a scribe for Dr. Antoine [name].  I'm seeing this patient by the request  of:  Tony Majestic, MD  CC: Right hip and low back pain  ONG:[id number]  Tony [name] is a 42 y.o. male coming in with complaint of back and neck pain. OMT 08/12/2022. Patient states continues to have some discomfort and pain as well.  Medications patient has been prescribed: None         Reviewed prior external information including notes and imaging from previsou exam, outside providers and external EMR if available.   As well as notes that were available from care everywhere and other healthcare systems.  Past medical history, social, surgical and family history all reviewed in electronic medical record.  No pertanent information unless stated regarding to the chief complaint.   Past Medical History:  Diagnosis Date   ALLERGIC RHINITIS 09/17/2009   ASTHMA 09/17/2009   HYPERLIPIDEMIA 09/17/2009    Allergies  Allergen Reactions   Prednisolone Other (See Comments)   Prednisone     Other reaction(s): Other (See Comments), Unknown hiccups      Review of Systems:  No headache, visual changes, nausea, vomiting, diarrhea, constipation, dizziness, abdominal pain, skin rash, fevers, chills, night sweats, weight loss, swollen lymph nodes, body aches, joint swelling, chest pain, shortness of breath, mood changes. POSITIVE muscle aches  Objective  Blood pressure 122/82, pulse 89, height 6\' 1"  (1.854 m), weight 235 lb (106.6 kg), SpO2 99%.   General: No apparent distress alert and oriented x3 mood and affect normal, dressed appropriately.  HEENT: Pupils equal, extraocular movements intact  Respiratory: Patient's speak in full sentences and does not appear short of breath  Cardiovascular: No lower extremity edema, non tender, no erythema  Gait normal  MSK:   Back does have loss of lordosis Does have tightness with FABER test.  Patient does have some weakness with hip abductor strength left greater than right.  Osteopathic findings  C5 flexed rotated and side bent left T3 extended rotated and side bent right inhaled rib T9 extended rotated and side bent left L2 flexed rotated and side bent right Sacrum right on right       Assessment and Plan:  SI (sacroiliac) joint dysfunction Chronic problem does have some tenderness noted.  Seems to be still more of the sacroiliac joint.  Continue to work on core strengthening.  Regimen and home exercises.  Discussed which activities to do and which follow-up again in 6 to 8 weeks otherwise.    Nonallopathic problems  Decision today to treat with OMT was based on Physical Exam  After verbal consent patient was treated with HVLA, ME, FPR techniques in cervical, rib, thoracic, lumbar, and sacral  areas  Patient tolerated the procedure well with improvement in symptoms  Patient given exercises, stretches and lifestyle modifications  See medications in patient instructions if given  Patient will follow up in 4-8 weeks     The above documentation has been reviewed and is accurate and complete Tony Saa, DO         Note: This dictation was prepared with Dragon dictation along with smaller phrase technology. Any transcriptional errors that result from this process are unintentional.

## 2022-09-10 ENCOUNTER — Ambulatory Visit (INDEPENDENT_AMBULATORY_CARE_PROVIDER_SITE_OTHER): Payer: 59 | Admitting: Family Medicine

## 2022-09-10 ENCOUNTER — Encounter: Payer: Self-pay | Admitting: Family Medicine

## 2022-09-10 VITALS — BP 122/82 | HR 89 | Ht 73.0 in | Wt 235.0 lb

## 2022-09-10 DIAGNOSIS — M533 Sacrococcygeal disorders, not elsewhere classified: Secondary | ICD-10-CM | POA: Diagnosis not present

## 2022-09-10 DIAGNOSIS — M9904 Segmental and somatic dysfunction of sacral region: Secondary | ICD-10-CM | POA: Diagnosis not present

## 2022-09-10 DIAGNOSIS — M9903 Segmental and somatic dysfunction of lumbar region: Secondary | ICD-10-CM

## 2022-09-10 DIAGNOSIS — M9902 Segmental and somatic dysfunction of thoracic region: Secondary | ICD-10-CM | POA: Diagnosis not present

## 2022-09-10 DIAGNOSIS — M9908 Segmental and somatic dysfunction of rib cage: Secondary | ICD-10-CM

## 2022-09-10 DIAGNOSIS — M9901 Segmental and somatic dysfunction of cervical region: Secondary | ICD-10-CM

## 2022-09-10 NOTE — Assessment & Plan Note (Signed)
Chronic problem does have some tenderness noted.  Seems to be still more of the sacroiliac joint.  Continue to work on core strengthening.  Regimen and home exercises.  Discussed which activities to do and which follow-up again in 6 to 8 weeks otherwise.

## 2022-09-11 ENCOUNTER — Ambulatory Visit: Payer: 59 | Admitting: Family Medicine

## 2022-09-24 ENCOUNTER — Encounter: Payer: Self-pay | Admitting: Family Medicine

## 2022-09-25 ENCOUNTER — Other Ambulatory Visit: Payer: Self-pay

## 2022-09-25 MED ORDER — PANTOPRAZOLE SODIUM 40 MG PO TBEC: 40.0000 mg | DELAYED_RELEASE_TABLET | Freq: Every day | ORAL | 2 refills | Status: DC

## 2022-09-25 NOTE — Telephone Encounter (Signed)
Yes, after the video visit in February Dr. Durene Cal wanted him to schedule a 2 month f/u for April and pt never scheduled that. Dr. Durene Cal is wanting him to schedule that follow up visit.

## 2022-09-25 NOTE — Telephone Encounter (Signed)
LVM informing pt of pcp's message below.

## 2022-09-25 NOTE — Telephone Encounter (Signed)
Patient returned my call. States there may be some confusion but he explained he last saw pcp for anxiety and discuss medication then that wasn't related to the pantoprazole that he is requesting a refill for.    It appears that patient's last visit was a video visit in February of this year for stress. Please Advise.

## 2022-09-26 NOTE — Telephone Encounter (Signed)
Called and informed pt of message below. Patient has been scheduled f/u for PCP on 11/12 @ 11 am. Pt is set to see Dulce Sellar on 9/23 @ 9 am due to needing the medication refill soon.

## 2022-09-26 NOTE — Telephone Encounter (Signed)
Called patient and offered last minute cancellation with PCP. Patient has been rescheduled for 9/24 @ 11 am.

## 2022-09-29 ENCOUNTER — Ambulatory Visit: Payer: 59 | Admitting: Family

## 2022-09-30 ENCOUNTER — Ambulatory Visit: Payer: 59 | Admitting: Family Medicine

## 2022-09-30 ENCOUNTER — Encounter: Payer: Self-pay | Admitting: Family Medicine

## 2022-09-30 VITALS — BP 124/88 | HR 94 | Temp 97.9°F | Resp 18 | Ht 73.0 in | Wt 231.4 lb

## 2022-09-30 DIAGNOSIS — J4540 Moderate persistent asthma, uncomplicated: Secondary | ICD-10-CM

## 2022-09-30 DIAGNOSIS — K219 Gastro-esophageal reflux disease without esophagitis: Secondary | ICD-10-CM | POA: Insufficient documentation

## 2022-09-30 MED ORDER — PANTOPRAZOLE SODIUM 20 MG PO TBEC: 20.0000 mg | DELAYED_RELEASE_TABLET | Freq: Every day | ORAL | 11 refills | Status: DC

## 2022-09-30 NOTE — Patient Instructions (Addendum)
reflux well controlled on pantoprazole 40 mg but does get flare if misses dose- has not tried 20 mg so we opted to trial that and if not working- he can call in and my team can send in pantoprazole 40 mg daily #30 with 11 refills   Recommended follow up: Return in about 6 months (around 03/30/2023) for physical or sooner if needed.Schedule b4 you leave.

## 2022-09-30 NOTE — Assessment & Plan Note (Signed)
#  Asthma- on allergy shots again and has been changed to Symbicort SMART therapy. Still on the singulair and xyzal and flonase

## 2022-09-30 NOTE — Assessment & Plan Note (Signed)
S:Medication: Protonix has been prescribed by an outside provider at 40 mg and have been very helpful. Had been seen by PA at [facility]  (had seen for allergy and had prescribed protonix) - was getting fair amount of indigestion before starting- if misses a dose really flares up on him.  A/P: reflux well controlled on pantoprazole 40 mg but does get flare if misses dose- has not tried 20 mg so we opted to trial that and if not working- he can call in and my team can send in pantoprazole 40 mg daily #30 with 11 refills  -Also discussed likely to improve with weight loss and dietary changes

## 2022-09-30 NOTE — Progress Notes (Signed)
Phone [phone] In person visit   Subjective:   Tony [name] is a 42 y.o. year old very pleasant male patient who presents for/with See problem oriented charting Chief Complaint  Patient presents with   Follow-up    Follow-up   Past Medical History-  Patient Active Problem List   Diagnosis Date Noted   Dyslipidemia 09/17/2009    Priority: Medium    Allergic rhinitis 09/17/2009    Priority: Medium    Asthma 09/17/2009    Priority: Medium    IBS-D 10/11/2015    Priority: Low   GERD (gastroesophageal reflux disease) 09/30/2022   Labral tear of right hip joint 01/28/2018   Right hip pain 10/13/2017   Quadriceps tendinitis 11/06/2015   SI (sacroiliac) joint dysfunction 11/06/2015   Nonallopathic lesion of sacral region 11/06/2015   Nonallopathic lesion of lumbosacral region 11/06/2015   Nonallopathic lesion of thoracic region 11/06/2015    Medications- reviewed and updated Current Outpatient Medications  Medication Sig Dispense Refill   Ascorbic Acid (VITAMIN C) 1000 MG tablet Take 1,000 mg by mouth daily.     EPINEPHrine 0.3 mg/0.3 mL IJ SOAJ injection as directed.     fexofenadine (ALLEGRA) 180 MG tablet Take 180 mg by mouth daily.     fluticasone (FLONASE) 50 MCG/ACT nasal spray Place 2 sprays into the nose daily.     levocetirizine (XYZAL) 5 MG tablet Take 5 mg by mouth daily.     meloxicam (MOBIC) 15 MG tablet Take 1 tablet (15 mg total) by mouth daily. 30 tablet 0   montelukast (SINGULAIR) 10 MG tablet Take 10 mg by mouth daily.     Multiple Vitamin (MULTI-VITAMIN) tablet Take 1 tablet by mouth daily.     pantoprazole (PROTONIX) 40 MG tablet Take 1 tablet (40 mg total) by mouth daily. 30 tablet 2   SYMBICORT 80-4.5 MCG/ACT inhaler SMARTSIG:2 Puff(s) Via Inhaler 3 Times Daily PRN     albuterol (PROVENTIL HFA;VENTOLIN HFA) 108 (90 Base) MCG/ACT inhaler Take 2 puffs by mouth every 6 hours as needed (Patient not taking: Reported on 09/30/2022) 18 Inhaler 0   No  current facility-administered medications for this visit.     Objective:  BP 124/88   Pulse 94   Temp 97.9 F (36.6 C) (Temporal)   Resp 18   Ht 6\' 1"  (1.854 m)   Wt 231 lb 6 oz (105 kg)   SpO2 99%   BMI 30.53 kg/m  Gen: NAD, resting comfortably CV: RRR no murmurs rubs or gallops Lungs: CTAB no crackles, wheeze, rhonchi Ext: no edema Skin: warm, dry     Assessment and Plan   # Situational stress S: Medication:Escitalopram 10 mg ordered last visit-he was anxious about Wellbutrin potentially worsening anxiety. He thought things over and ultimately didn't end up taking this. Got married in may and custody got scheduled. Has been seeing his therapist on regular basis at [facility]- only needed one Eye Movement Desensitization and Reprocessing (EMDR) session and more back to traditional talk therapy. Ashwagandha helps in periods. Doing Bible study with wife and faith has been important to him.    History messages:  -At February visit patient reported issues with sleep for at least 8 or 9 months.  Intense stress since 2022 with separation from wife, dealing with custody and then losing his father in July 2023 due to pneumonia. -He had started therapy with [facility] and had 3 sessions and at that time-EMDR therapy included -Therapist had mentioned possible SSRI  09/30/2022   11:27 AM 02/13/2022    8:05 AM 11/15/2020   11:47 AM  Depression screen PHQ 2/9  Decreased Interest 0 0 0  Down, Depressed, Hopeless 0 1 1  PHQ - 2 Score 0 1 1  Altered sleeping 1 3   Tired, decreased energy 0 1   Change in appetite 0 0   Feeling bad or failure about yourself  0 1   Trouble concentrating 0 1   Moving slowly or fidgety/restless 0 0   Suicidal thoughts 0 0   PHQ-9 Score 1 7   Difficult doing work/chores Not difficult at all Somewhat difficult    A/P: situational stress much improved- continue therapy as doing very well with this   # GERD S:Medication: Protonix has been  prescribed by an outside provider at 40 mg and have been very helpful. Had been seen by PA at [facility]  (had seen for allergy and had prescribed protonix) - was getting fair amount of indigestion before starting- if misses a dose really flares up on him.  A/P: reflux well controlled on pantoprazole 40 mg but does get flare if misses dose- has not tried 20 mg so we opted to trial that and if not working- he can call in and my team can send in pantoprazole 40 mg daily #30 with 11 refills  -Also discussed likely to improve with weight loss and dietary changes  #Asthma- on allergy shots again and has been changed to Symbicort SMART therapy. Still on the singulair and xyzal and flonase   Recommended follow up: Return in about 6 months (around 03/30/2023) for physical or sooner if needed.Schedule b4 you leave.  Lab/Order associations:   ICD-10-CM   1. Gastroesophageal reflux disease without esophagitis  K21.9     2. Moderate persistent asthma without complication  J45.40       Meds ordered this encounter  Medications   pantoprazole (PROTONIX) 20 MG tablet    Sig: Take 1 tablet (20 mg total) by mouth daily.    Dispense:  30 tablet    Refill:  11    Return precautions advised.  Tana Conch, MD

## 2022-11-18 ENCOUNTER — Ambulatory Visit: Payer: 59 | Admitting: Family Medicine

## 2023-01-27 ENCOUNTER — Ambulatory Visit: Payer: 59 | Admitting: Family Medicine

## 2023-01-27 ENCOUNTER — Encounter: Payer: Self-pay | Admitting: Family Medicine

## 2023-01-27 VITALS — BP 132/90 | HR 103 | Temp 98.7°F | Ht 73.0 in | Wt 232.6 lb

## 2023-01-27 DIAGNOSIS — R051 Acute cough: Secondary | ICD-10-CM | POA: Diagnosis not present

## 2023-01-27 DIAGNOSIS — J4541 Moderate persistent asthma with (acute) exacerbation: Secondary | ICD-10-CM

## 2023-01-27 DIAGNOSIS — J0141 Acute recurrent pansinusitis: Secondary | ICD-10-CM | POA: Insufficient documentation

## 2023-01-27 MED ORDER — ALBUTEROL SULFATE HFA 108 (90 BASE) MCG/ACT IN AERS
2.0000 | INHALATION_SPRAY | Freq: Four times a day (QID) | RESPIRATORY_TRACT | 0 refills | Status: DC | PRN
Start: 2023-01-27 — End: 2023-02-18

## 2023-01-27 MED ORDER — BENZONATATE 200 MG PO CAPS
200.0000 mg | ORAL_CAPSULE | Freq: Three times a day (TID) | ORAL | 0 refills | Status: DC | PRN
Start: 2023-01-27 — End: 2023-07-15

## 2023-01-27 MED ORDER — PROMETHAZINE-DM 6.25-15 MG/5ML PO SYRP
5.0000 mL | ORAL_SOLUTION | Freq: Four times a day (QID) | ORAL | 0 refills | Status: DC | PRN
Start: 2023-01-27 — End: 2023-07-15

## 2023-01-27 MED ORDER — DOXYCYCLINE HYCLATE 100 MG PO TABS
100.0000 mg | ORAL_TABLET | Freq: Two times a day (BID) | ORAL | 0 refills | Status: AC
Start: 2023-01-27 — End: 2023-02-03

## 2023-01-27 MED ORDER — METHYLPREDNISOLONE 4 MG PO TBPK: ORAL_TABLET | ORAL | 0 refills | Status: AC

## 2023-01-27 NOTE — Assessment & Plan Note (Addendum)
-   Prescribe methylprednisolone dose pack for 5 days. - Prescribe doxycycline for 7 days to treat sinusitis. - Prescribe albuterol inhaler for use as needed. - Prescribe promethazine DM syrup for nighttime cough relief. - Consider Tessalon Perles for daytime cough suppression if needed. - Advise patient to continue using Symbicort as directed. - Encourage increased fluid intake. - Instruct patient to monitor symptoms and to follow up if there is no improvement within 1-2 days or if symptoms worsen. - Consider obtaining a chest X-ray to rule out pneumonia if symptoms persist. - Patient to the emergency department if symptoms are severe

## 2023-01-27 NOTE — Progress Notes (Signed)
Assessment/Plan:   Problem List Items Addressed This Visit       Respiratory   Asthma - Primary   - Prescribe methylprednisolone dose pack for 5 days. - Prescribe doxycycline for 7 days to treat sinusitis. - Prescribe albuterol inhaler for use as needed. - Prescribe promethazine DM syrup for nighttime cough relief. - Consider Tessalon Perles for daytime cough suppression if needed. - Advise patient to continue using Symbicort as directed. - Encourage increased fluid intake. - Instruct patient to monitor symptoms and to follow up if there is no improvement within 1-2 days or if symptoms worsen. - Consider obtaining a chest X-ray to rule out pneumonia if symptoms persist. - Patient to the emergency department if symptoms are severe      Relevant Medications   albuterol (VENTOLIN HFA) 108 (90 Base) MCG/ACT inhaler   methylPREDNISolone (MEDROL DOSEPAK) 4 MG TBPK tablet   Acute recurrent pansinusitis   Relevant Medications   methylPREDNISolone (MEDROL DOSEPAK) 4 MG TBPK tablet   doxycycline (VIBRA-TABS) 100 MG tablet   promethazine-dextromethorphan (PROMETHAZINE-DM) 6.25-15 MG/5ML syrup   benzonatate (TESSALON) 200 MG capsule     Other   Acute cough   Relevant Medications   promethazine-dextromethorphan (PROMETHAZINE-DM) 6.25-15 MG/5ML syrup   benzonatate (TESSALON) 200 MG capsule    There are no discontinued medications.  Return in about 2 days (around 01/29/2023), or if symptoms worsen or fail to improve.    Subjective:   Encounter date: 01/27/2023  Tony [name] is a 43 y.o. male who has Dyslipidemia; Allergic rhinitis; Asthma; Acute cough; IBS-D; Quadriceps tendinitis; SI (sacroiliac) joint dysfunction; Nonallopathic lesion of sacral region; Nonallopathic lesion of lumbosacral region; Nonallopathic lesion of thoracic region; Right hip pain; Labral tear of right hip joint; GERD (gastroesophageal reflux disease); and Acute recurrent pansinusitis on their problem list..    He  has a past medical history of ALLERGIC RHINITIS (09/17/2009), ASTHMA (09/17/2009), and HYPERLIPIDEMIA (09/17/2009)..   Chief Complaint: Cough, shortness of breath, congestion.  History of Present Illness:  Patient with a history of asthma and chronic sinus problems presents with worsening cough, shortness of breath, and sinus congestion. Symptoms began approximately one and a half weeks ago with nasal congestion, which he initially attributed to a possible sinus infection but did not feel significantly ill. He returned from a cruise to the [place] yesterday.  Yesterday morning, while returning to [place], he awoke with persistent coughing that has not subsided. The cough is severe, causing him to feel like he might vomit due to coughing so hard. He reports feeling wheezy and has been experiencing shortness of breath.  He has been using his Symbicort inhaler (160/4.5 mcg) more frequently--about 3 to 4 times daily--with only short-term relief. He notes that he usually uses Symbicort as needed, following his allergist's recommendation to use it as a rescue inhaler instead of albuterol.  He reports fevers starting last night, reaching temperatures of 100-101F. He took Tylenol this morning. He experiences chills and requires additional blankets due to shivering. He reports facial pain in the sinus areas and yellow nasal discharge. He has a mild upset stomach but denies nausea, vomiting, or diarrhea.  Review of Systems  Constitutional:  Positive for fever. Negative for chills, diaphoresis, malaise/fatigue and weight loss.  HENT:  Positive for congestion and sinus pain. Negative for ear discharge, ear pain and hearing loss.   Eyes:  Negative for blurred vision, double vision, photophobia, pain, discharge and redness.  Respiratory:  Positive for cough, shortness of breath and wheezing.  Negative for sputum production.   Cardiovascular:  Negative for chest pain and palpitations.  Gastrointestinal:   Negative for abdominal pain, blood in stool, constipation, diarrhea, heartburn, melena, nausea and vomiting.  Genitourinary:  Negative for dysuria, flank pain, frequency, hematuria and urgency.  Musculoskeletal:  Negative for myalgias.  Skin:  Negative for itching and rash.  Neurological:  Negative for dizziness, tingling, tremors, speech change, seizures, loss of consciousness, weakness and headaches.  Endo/Heme/Allergies:  Negative for polydipsia.  Psychiatric/Behavioral:  Negative for depression, hallucinations, memory loss, substance abuse and suicidal ideas. The patient does not have insomnia.   All other systems reviewed and are negative.    Past Surgical History:  Procedure Laterality Date   HIP SURGERY     Oct 2020   NASAL SINUS SURGERY Right 12/04/2014   Dr. Jenne Pane   SINUS EXPLORATION  Nov 2014    per Dr. Jenne Pane     Outpatient Medications Prior to Visit  Medication Sig Dispense Refill   Ascorbic Acid (VITAMIN C) 1000 MG tablet Take 1,000 mg by mouth daily.     EPINEPHrine 0.3 mg/0.3 mL IJ SOAJ injection as directed.     fexofenadine (ALLEGRA) 180 MG tablet Take 180 mg by mouth daily.     fluticasone (FLONASE) 50 MCG/ACT nasal spray Place 2 sprays into the nose daily.     montelukast (SINGULAIR) 10 MG tablet Take 10 mg by mouth daily.     Multiple Vitamin (MULTI-VITAMIN) tablet Take 1 tablet by mouth daily.     pantoprazole (PROTONIX) 20 MG tablet Take 1 tablet (20 mg total) by mouth daily. 30 tablet 11   SYMBICORT 80-4.5 MCG/ACT inhaler SMARTSIG:2 Puff(s) Via Inhaler 3 Times Daily PRN     No facility-administered medications prior to visit.    Family History  Problem Relation Age of Onset   Healthy Mother    Sinusitis Father        recurrent- sinus surgery multiple. rare fungal infection 20 cases in world.    CAD Father        age 66 first heart attack. never smoker   Alcohol abuse Father    Depression Father        abused pain pills in past   Atrial fibrillation  Father    Kidney disease Father        lifestyle/treatment of sinus issues related   Stroke Father        related to a fib- watchman device   Dementia Father        vascular   Pneumonia Father        died of this 08/12/2021- long term battle since january- in SNF   Healthy Brother    Healthy Son    Healthy Son     Social History   Socioeconomic History   Marital status: Married    Spouse name: Not on file   Number of children: Not on file   Years of education: Not on file   Highest education level: Not on file  Occupational History   Not on file  Tobacco Use   Smoking status: Never   Smokeless tobacco: Never  Substance and Sexual Activity   Alcohol use: Yes    Comment: occ   Drug use: No   Sexual activity: Not on file  Other Topics Concern   Not on file  Social History Narrative   Married. 2 children. [name] (May 2016). [name] 08/13/11). 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       Hobbies: time with sons, going to gym   [name]   Social Drivers of Health   Financial Resource Strain: Not on file  Food Insecurity: Not on file  Transportation Needs: Not on file  Physical Activity: Not on file  Stress: Not on file  Social Connections: Unknown (05/20/2021)   Received from [facility], [facility]   Social Network    Social Network: Not on file  Intimate Partner Violence: Unknown (04/11/2021)   Received from [facility], [facility]   HITS    Physically Hurt: Not on file    Insult or Talk Down To: Not on file    Threaten Physical Harm: Not on file    Scream or Curse: Not on file                                                                                                  Objective:  Physical Exam: BP (!) 132/90 (BP Location: Left Arm, Patient Position: Sitting, Cuff Size: Normal)   Pulse (!) 103   Temp 98.7 F (37.1 C) (Oral)   Ht 6\' 1"  (1.854 m)   Wt 232 lb 9.6 oz  (105.5 kg)   SpO2 97%   BMI 30.69 kg/m     Physical Exam Constitutional:      Appearance: Normal appearance.  HENT:     Head: Normocephalic and atraumatic.     Right Ear: Hearing normal.     Left Ear: Hearing normal.     Nose:     Right Sinus: Maxillary sinus tenderness and frontal sinus tenderness present.     Left Sinus: Maxillary sinus tenderness and frontal sinus tenderness present.  Eyes:     General: No scleral icterus.       Right eye: No discharge.        Left eye: No discharge.     Extraocular Movements: Extraocular movements intact.  Cardiovascular:     Rate and Rhythm: Normal rate and regular rhythm.     Heart sounds: Normal heart sounds.  Pulmonary:     Effort: Pulmonary effort is normal.     Breath sounds: Wheezing present.  Abdominal:     Palpations: Abdomen is soft.     Tenderness: There is no abdominal tenderness.  Skin:    General: Skin is warm.     Findings: No rash.  Neurological:     General: No focal deficit present.     Mental Status: He is alert.     Cranial Nerves: No cranial nerve deficit.  Psychiatric:        Mood and Affect: Mood normal.        Behavior: Behavior normal.        Thought Content: Thought content normal.        Judgment: Judgment normal.     No results found.  No results found for this or any previous visit (from the past 2160 hours).      Garner Nash, MD, MS

## 2023-01-27 NOTE — Patient Instructions (Addendum)
For asthma, continue Symbicort.  We are prescribing albuterol inhaler and the Medrol course pack  For sinusitis, we are prescribing doxycycline.  Please be sure to drink plenty of fluids.   You may take the following OTC medications to help with symptoms:  For cough, use  cough syrups or other cough suppressants.  For headache, sore throat, fevers, muscle aches, chills, other pain, take ibuprofen or tylenol  For congestion, use nasal sprays, decongestants, or antihistamines  Please follow up if no improvement.   Go to ED if you have severe chest pain, fevers, shortness of breath or other worrisome symptoms.

## 2023-01-28 ENCOUNTER — Ambulatory Visit: Payer: Self-pay | Admitting: Family Medicine

## 2023-01-28 NOTE — Telephone Encounter (Signed)
See below

## 2023-01-28 NOTE — Telephone Encounter (Signed)
I agree with stopping the doxycycline.  We could try something like azithromycin but there is a fair amount of resistance to bacterial sinus infections with this.  Another option would be seeing how he does on the steroid alone.  There are some other antibiotics we could try but they may be similar on the GI tract-let me know his preference-likely will be tomorrow that I will be able to send this in

## 2023-01-28 NOTE — Telephone Encounter (Signed)
Called and spoke with pt and he sees his allergist tomorrow and will talk to him also. He will try the steroid alone and hold off on any other antibiotics for now.

## 2023-01-28 NOTE — Telephone Encounter (Signed)
  Chief Complaint: Medication reaction Symptoms: diarrhea, abdominal pain on antibiotic Frequency: began yesterday with first dose of doxycycline Pertinent Negatives: Patient denies fever, chills, blood in stool Disposition: [] ED /[] Urgent Care (no appt availability in office) / [] Appointment(In office/virtual)/ []  [facility]/ [] Home Care/ [] Refused Recommended Disposition /[] [facility]/ [x]  Follow-up with PCP Additional Notes: Patient called stating he was seen yesterday at another practice and prescribed doxycycline for a sinus infection, took first dose last night and began having stomach cramping and after second dose this morning has been experiencing diarrhea. Patient reports he had a similar experience when taking Augmentin. Patient wants to dc antibiotic and is asking if anything else can be prescribed. Care advice reviewed, alerting PCP for follow up.   Reason for Disposition  Patient wants to stop the antibiotic  Answer Assessment - Initial Assessment Questions 1. ANTIBIOTIC: "What antibiotic are you taking?" "How many times per day?"     Doxycycline, two doses 2. ANTIBIOTIC ONSET: "When was the antibiotic started?"     Last night 3. DIARRHEA SEVERITY: "How bad is the diarrhea?" "How many more stools have you had in the past 24 hours than normal?"    - NO DIARRHEA (SCALE 0)   - MILD (SCALE 1-3): Few loose or mushy BMs; increase of 1-3 stools over normal daily number of stools; mild increase in ostomy output.   -  MODERATE (SCALE 4-7): Increase of 4-6 stools daily over normal; moderate increase in ostomy output. * SEVERE (SCALE 8-10; OR 'WORST POSSIBLE'): Increase of 7 or more stools daily over normal; moderate increase in ostomy output; incontinence.     5-6 times 4. ONSET: "When did the diarrhea begin?"      This morning 5. BM CONSISTENCY: "How loose or watery is the diarrhea?"      loose 6. VOMITING: "Are you also vomiting?" If Yes, ask: "How many times  in the past 24 hours?"      Denies, but had nausea last night 7. ABDOMEN PAIN: "Are you having any abdomen pain?" If Yes, ask: "What does it feel like?" (e.g., crampy, dull, intermittent, constant)      Cramping, was doubled over this morning 8. ABDOMEN PAIN SEVERITY: If present, ask: "How bad is the pain?"  (e.g., Scale 1-10; mild, moderate, or severe)   - MILD (1-3): doesn't interfere with normal activities, abdomen soft and not tender to touch    - MODERATE (4-7): interferes with normal activities or awakens from sleep, abdomen tender to touch    - SEVERE (8-10): excruciating pain, doubled over, unable to do any normal activities       5/10 9. ORAL INTAKE: If vomiting, "Have you been able to drink liquids?" "How much liquids have you had in the past 24 hours?"     Can hold down fluids okay 10. HYDRATION: "Any signs of dehydration?" (e.g., dry mouth [not just dry lips], too weak to stand, dizziness, new weight loss) "When did you last urinate?"       Denies  12. OTHER SYMPTOMS: "Do you have any other symptoms?" (e.g., fever, blood in stool)       Denies  Protocols used: Diarrhea on Antibiotics-A-AH

## 2023-02-18 ENCOUNTER — Other Ambulatory Visit: Payer: Self-pay | Admitting: Family Medicine

## 2023-02-18 DIAGNOSIS — J4541 Moderate persistent asthma with (acute) exacerbation: Secondary | ICD-10-CM

## 2023-03-02 NOTE — Progress Notes (Unsigned)
 [name] [address] [place] [address] Phone: [phone] Subjective:   [name] am a scribe for Dr. Katrinka Blazing.   I'm seeing this patient by the request  of:  Shelva Majestic, MD  CC: Low back pain, left arm pain  [name]  Tony [name] is a 43 y.o. male coming in with complaint of back and neck pain. OMT 09/10/2022. Patient states neck is fine today but lower back is really sore today. Patient states that he was sitting in a chair for 5 hours and that flares his back. Picked up his youngest son and feels something in his left pectoral that isn't normal.   Medications patient has been prescribed: None  Taking:         Reviewed prior external information including notes and imaging from previsou exam, outside providers and external EMR if available.   As well as notes that were available from care everywhere and other healthcare systems.  Past medical history, social, surgical and family history all reviewed in electronic medical record.  No pertanent information unless stated regarding to the chief complaint.   Past Medical History:  Diagnosis Date   ALLERGIC RHINITIS 09/17/2009   ASTHMA 09/17/2009   HYPERLIPIDEMIA 09/17/2009    Allergies  Allergen Reactions   Augmentin [Amoxicillin-Pot Clavulanate]     Stomach cramping/diarrhea   Doxycycline     Stomach cramping/diarrhea   Prednisolone Other (See Comments)   Prednisone Other (See Comments)    Other reaction(s): Other (See Comments), Unknown  hiccups  Other Reaction(s): Other (See Comments)     Review of Systems:  No headache, visual changes, nausea, vomiting, diarrhea, constipation, dizziness, abdominal pain, skin rash, fevers, chills, night sweats, weight loss, swollen lymph nodes, body aches, joint swelling, chest pain, shortness of breath, mood changes. POSITIVE muscle aches  Objective  Blood pressure 130/80, pulse 92, height 6\' 1"  (1.854 m), weight  235 lb 9.6 oz (106.9 kg), SpO2 99%.   General: No apparent distress alert and oriented x3 mood and affect normal, dressed appropriately.  HEENT: Pupils equal, extraocular movements intact  Respiratory: Patient's speak in full sentences and does not appear short of breath  Cardiovascular: No lower extremity edema, non tender, no erythema  Low back exam does have some loss of lordosis noted.  Some tightness noted in the paraspinal musculature.  Tightness with FABER test but deferred hip exercises secondary to discomfort and pain.  Osteopathic findings  C2 flexed rotated and side bent right C7 flexed rotated and side bent left T3 extended rotated and side bent right inhaled rib T7 extended rotated and side bent left L1 flexed rotated and side bent right Sacrum right on right   Had some pain over the pectoralis minor on the left side.  Pain with resisted adduction of the arm.  No masses appreciated.    Assessment and Plan:  SI (sacroiliac) joint dysfunction Chronic problem with some poor core strength.  Discussed icing regimen of home exercises, continue to increase activity as tolerated.  Discussed which activities to do and which ones to avoid.  Increase activity slowly.  Follow-up again in 6 to 8 weeks otherwise.    Nonallopathic problems  Decision today to treat with OMT was based on Physical Exam  After verbal consent patient was treated with HVLA, ME, FPR techniques in cervical, rib, thoracic, lumbar, and sacral  areas  Patient tolerated the procedure well with improvement in symptoms  Patient given exercises, stretches and lifestyle modifications  See medications in patient instructions if given  Patient will follow up in 4-8 weeks      The above documentation has been reviewed and is accurate and complete Judi Saa, DO        Note: This dictation was prepared with Dragon dictation along with smaller phrase technology. Any transcriptional errors that result  from this process are unintentional.

## 2023-03-03 ENCOUNTER — Ambulatory Visit: Payer: 59 | Admitting: Family Medicine

## 2023-03-03 ENCOUNTER — Encounter: Payer: Self-pay | Admitting: Family Medicine

## 2023-03-03 VITALS — BP 130/80 | HR 92 | Ht 73.0 in | Wt 235.6 lb

## 2023-03-03 DIAGNOSIS — M9908 Segmental and somatic dysfunction of rib cage: Secondary | ICD-10-CM | POA: Diagnosis not present

## 2023-03-03 DIAGNOSIS — M9903 Segmental and somatic dysfunction of lumbar region: Secondary | ICD-10-CM | POA: Diagnosis not present

## 2023-03-03 DIAGNOSIS — M533 Sacrococcygeal disorders, not elsewhere classified: Secondary | ICD-10-CM

## 2023-03-03 DIAGNOSIS — M9902 Segmental and somatic dysfunction of thoracic region: Secondary | ICD-10-CM | POA: Diagnosis not present

## 2023-03-03 DIAGNOSIS — M9901 Segmental and somatic dysfunction of cervical region: Secondary | ICD-10-CM | POA: Diagnosis not present

## 2023-03-03 DIAGNOSIS — M9904 Segmental and somatic dysfunction of sacral region: Secondary | ICD-10-CM | POA: Diagnosis not present

## 2023-03-03 NOTE — Patient Instructions (Addendum)
 Good to see you. HEP. Try to keep hands in Periferal vision. Stop picking up Haus.  Return in 4 to 6 weeks.

## 2023-03-03 NOTE — Assessment & Plan Note (Signed)
 Chronic problem with some poor core strength.  Discussed icing regimen of home exercises, continue to increase activity as tolerated.  Discussed which activities to do and which ones to avoid.  Increase activity slowly.  Follow-up again in 6 to 8 weeks otherwise.

## 2023-03-05 ENCOUNTER — Ambulatory Visit: Payer: 59 | Admitting: Family Medicine

## 2023-03-09 ENCOUNTER — Ambulatory Visit: Admitting: Family Medicine

## 2023-03-09 ENCOUNTER — Encounter: Payer: Self-pay | Admitting: Family Medicine

## 2023-03-09 VITALS — BP 106/76 | HR 115 | Temp 98.6°F | Ht 73.0 in | Wt 228.6 lb

## 2023-03-09 DIAGNOSIS — R0981 Nasal congestion: Secondary | ICD-10-CM | POA: Diagnosis not present

## 2023-03-09 DIAGNOSIS — R059 Cough, unspecified: Secondary | ICD-10-CM

## 2023-03-09 DIAGNOSIS — J101 Influenza due to other identified influenza virus with other respiratory manifestations: Secondary | ICD-10-CM

## 2023-03-09 DIAGNOSIS — R110 Nausea: Secondary | ICD-10-CM | POA: Diagnosis not present

## 2023-03-09 LAB — POC COVID19 BINAXNOW: SARS Coronavirus 2 Ag: NEGATIVE

## 2023-03-09 LAB — POCT INFLUENZA A/B
Influenza A, POC: POSITIVE — AB
Influenza B, POC: NEGATIVE

## 2023-03-09 MED ORDER — ONDANSETRON 4 MG PO TBDP: 4.0000 mg | ORAL_TABLET | Freq: Three times a day (TID) | ORAL | 0 refills | Status: AC | PRN

## 2023-03-09 NOTE — Progress Notes (Signed)
 Acute Office Visit   Subjective:  Patient ID: Tony [name], male    DOB: [birth year], 43 y.o.   MRN: [id number]  Chief Complaint  Patient presents with   Nasal Congestion   Fatigue   Cough    Pt reports he started feeling bad on Wednesday. Gotten better then feeling bad again on Saturday. Reports sx- chills, bodyache, sweat, congestion, bad headache, nausea, loss of appetite, coughing up-yellow phlegm. . Pt states he did not do home covid/flu test. Sore throat today. Took mucinex, tylenol and advil.     Cough   On Wednesday, he developed headache, fatigued, nausea, and body aches. Then, on Wednesday night, his symptoms improved. Then on Saturday, started having symptoms again. Patient is complaining of ear ache, cough varies between productive and non productive, headache, nasal congestion and drainage, nausea, diarrhea, and mild sore throat.   Denies CP and SHOB. He has had coworkers that have been ill.    He has took Mucinex, Tylenol, and Advil.   Review of Systems  Respiratory:  Positive for cough.    See HPI above      Objective:   BP 106/76 (BP Location: Left Arm, Patient Position: Sitting, Cuff Size: Large)   Pulse (!) 115   Temp 98.6 F (37 C) (Oral)   Ht 6\' 1"  (1.854 m)   Wt 228 lb 9.6 oz (103.7 kg)   SpO2 95%   BMI 30.16 kg/m    Physical Exam Vitals reviewed.  Constitutional:      General: He is not in acute distress.    Appearance: Normal appearance. He is not ill-appearing (Mild), toxic-appearing or diaphoretic.  HENT:     Head: Normocephalic and atraumatic.     Right Ear: Ear canal and external ear normal. There is no impacted cerumen.     Left Ear: Tympanic membrane, ear canal and external ear normal. There is no impacted cerumen.     Ears:     Comments: Right TM: Mild clear fluid behind TM.     Nose: Congestion present.     Right Sinus: No maxillary sinus tenderness or frontal sinus tenderness.     Left Sinus: No maxillary sinus tenderness or  frontal sinus tenderness.     Mouth/Throat:     Pharynx: Oropharynx is clear. Uvula midline. No pharyngeal swelling, oropharyngeal exudate, posterior oropharyngeal erythema or uvula swelling.  Eyes:     General:        Right eye: No discharge.        Left eye: No discharge.     Conjunctiva/sclera: Conjunctivae normal.  Cardiovascular:     Rate and Rhythm: Normal rate and regular rhythm.     Heart sounds: Normal heart sounds. No murmur heard.    No friction rub. No gallop.  Pulmonary:     Effort: Pulmonary effort is normal. No respiratory distress.     Breath sounds: Normal breath sounds.     Comments: Cough present  Musculoskeletal:        General: Normal range of motion.  Skin:    General: Skin is warm and dry.  Neurological:     General: No focal deficit present.     Mental Status: He is alert and oriented to person, place, and time. Mental status is at baseline.  Psychiatric:        Mood and Affect: Mood normal.        Behavior: Behavior normal.        Thought Content: Thought content normal.  Judgment: Judgment normal.     Results for orders placed or performed in visit on 03/09/23  POC COVID-19  Result Value Ref Range   SARS Coronavirus 2 Ag Negative Negative  POC Influenza A/B  Result Value Ref Range   Influenza A, POC Positive (A) Negative   Influenza B, POC Negative Negative      Assessment & Plan:  Influenza A  Cough, unspecified type -     POC COVID-19 BinaxNow -     POCT Influenza A/B  Nasal congestion -     POC COVID-19 BinaxNow -     POCT Influenza A/B  Nausea -     Ondansetron; Take 1 tablet (4 mg total) by mouth every 8 (eight) hours as needed for nausea or vomiting.  Dispense: 20 tablet; Refill: 0  -Positive for influenza A. Negative for covid.  -Decided against Tamiflu.  -Recommend supportative care.  -Alternate Tylenol 1000mg  and Ibuprofen 600-800mg  every 4 hours for pain, headache, and body aches. Recommend to eat something when  taking Ibuprofen.   -Continue with Mucinex and use Flonase.  -Rest, hydrate.  -Work note provided to return back on Friday, 03/13/2022.   Zandra Abts, NP

## 2023-03-09 NOTE — Patient Instructions (Signed)
-  Positive for influenza A. Negative for covid.  -Decided against Tamiflu.  -Recommend supportative care.  -Alternate Tylenol 1000mg  and Ibuprofen 600-800mg  every 4 hours for pain, headache, and body aches. Recommend to eat something when taking Ibuprofen.   -Continue with Mucinex and use Flonase.  -Rest, hydrate.  -Work note provided to return back on Friday, 03/13/2022.  -I hope you get to feeling better soon.

## 2023-04-02 NOTE — Progress Notes (Signed)
 [name] [address] [place] [address] Phone: [phone] Subjective:   Tony [name] am a scribe for Dr. Katrinka Blazing.   I'm seeing this patient by the request  of:  Shelva Majestic, MD  CC: Back and neck pain follow-up  [name]  Tony [name] is a 43 y.o. male coming in with complaint of back and neck pain. OMT on 03/03/2023. Patient states back is tight but neck is fine today.  Medications patient has been prescribed:   Taking:         Reviewed prior external information including notes and imaging from previsou exam, outside providers and external EMR if available.   As well as notes that were available from care everywhere and other healthcare systems.  Past medical history, social, surgical and family history all reviewed in electronic medical record.  No pertanent information unless stated regarding to the chief complaint.   Past Medical History:  Diagnosis Date   ALLERGIC RHINITIS 09/17/2009   ASTHMA 09/17/2009   HYPERLIPIDEMIA 09/17/2009    Allergies  Allergen Reactions   Augmentin [Amoxicillin-Pot Clavulanate]     Stomach cramping/diarrhea   Doxycycline     Stomach cramping/diarrhea   Prednisolone Other (See Comments)   Prednisone Other (See Comments)    Other reaction(s): Other (See Comments), Unknown  hiccups  Other Reaction(s): Other (See Comments)     Review of Systems:  No headache, visual changes, nausea, vomiting, diarrhea, constipation, dizziness, abdominal pain, skin rash, fevers, chills, night sweats, weight loss, swollen lymph nodes, body aches, joint swelling, chest pain, shortness of breath, mood changes. POSITIVE muscle aches  Objective  Blood pressure 122/80, pulse 97, height 6\' 1"  (1.854 m), SpO2 99%.   General: No apparent distress alert and oriented x3 mood and affect normal, dressed appropriately.  HEENT: Pupils equal, extraocular movements intact  Respiratory: Patient's speak in  full sentences and does not appear short of breath  Cardiovascular: No lower extremity edema, non tender, no erythema  Gait MSK:  Back back does have some loss lordosis noted.  Some tenderness to palpation in the paraspinal musculature.  Osteopathic findings  C2 flexed rotated and side bent right C6 flexed rotated and side bent left T3 extended rotated and side bent right inhaled rib T9 extended rotated and side bent left L2 flexed rotated and side bent right Sacrum right on right       Assessment and Plan:  SI (sacroiliac) joint dysfunction Chronic problem.  Seems to be somewhat work-related.  Discussed continuing to work on core strength.  Patient's hip is doing much better after the surgeries that he has had previously.  Will be traveling in the near future.  Discussed icing regimen and home exercises, discussed which activities to do and which ones to avoid.  Follow-up again in 6 to 8 weeks    Nonallopathic problems  Decision today to treat with OMT was based on Physical Exam  After verbal consent patient was treated with HVLA, ME, FPR techniques in cervical, rib, thoracic, lumbar, and sacral  areas  Patient tolerated the procedure well with improvement in symptoms  Patient given exercises, stretches and lifestyle modifications  See medications in patient instructions if given  Patient will follow up in 4-8 weeks     The above documentation has been reviewed and is accurate and complete Tony Saa, DO         Note: This dictation was prepared with Dragon dictation along with smaller phrase technology. Any  transcriptional errors that result from this process are unintentional.

## 2023-04-07 ENCOUNTER — Encounter: Payer: Self-pay | Admitting: Family Medicine

## 2023-04-07 ENCOUNTER — Ambulatory Visit: Payer: 59 | Admitting: Family Medicine

## 2023-04-07 VITALS — BP 122/80 | HR 97 | Ht 73.0 in

## 2023-04-07 DIAGNOSIS — M9903 Segmental and somatic dysfunction of lumbar region: Secondary | ICD-10-CM

## 2023-04-07 DIAGNOSIS — M9908 Segmental and somatic dysfunction of rib cage: Secondary | ICD-10-CM

## 2023-04-07 DIAGNOSIS — M9902 Segmental and somatic dysfunction of thoracic region: Secondary | ICD-10-CM

## 2023-04-07 DIAGNOSIS — M9904 Segmental and somatic dysfunction of sacral region: Secondary | ICD-10-CM | POA: Diagnosis not present

## 2023-04-07 DIAGNOSIS — M9901 Segmental and somatic dysfunction of cervical region: Secondary | ICD-10-CM

## 2023-04-07 DIAGNOSIS — M533 Sacrococcygeal disorders, not elsewhere classified: Secondary | ICD-10-CM | POA: Diagnosis not present

## 2023-04-07 NOTE — Assessment & Plan Note (Signed)
 Chronic problem.  Seems to be somewhat work-related.  Discussed continuing to work on core strength.  Patient's hip is doing much better after the surgeries that he has had previously.  Will be traveling in the near future.  Discussed icing regimen and home exercises, discussed which activities to do and which ones to avoid.  Follow-up again in 6 to 8 weeks

## 2023-04-07 NOTE — Patient Instructions (Signed)
See me again in 6 weeks 

## 2023-05-07 NOTE — Progress Notes (Unsigned)
 [facility] [address] [place] [address] Phone: [phone] Subjective:   Tony [name], am serving as a scribe for Dr. Ronnell Coins.  I'm seeing this patient by the request  of:  Almira Jaeger, MD  CC: back and neck pain follow up   [id number]  Tony [name] is a 43 y.o. male coming in with complaint of back and neck pain. OMT 04/07/2023. Patient states that he is doing well.   R ankle pain over lateral mallelolous. Son stepped on his foot a few weeks ago. When he touches lateral ankle it is numb and tingly.   Medications patient has been prescribed: None         Reviewed prior external information including notes and imaging from previsou exam, outside providers and external EMR if available.   As well as notes that were available from care everywhere and other healthcare systems.  Past medical history, social, surgical and family history all reviewed in electronic medical record.  No pertanent information unless stated regarding to the chief complaint.   Past Medical History:  Diagnosis Date   ALLERGIC RHINITIS 09/17/2009   ASTHMA 09/17/2009   HYPERLIPIDEMIA 09/17/2009    Allergies  Allergen Reactions   Augmentin  [Amoxicillin -Pot Clavulanate]     Stomach cramping/diarrhea   Doxycycline      Stomach cramping/diarrhea   Prednisolone Other (See Comments)   Prednisone  Other (See Comments)    Other reaction(s): Other (See Comments), Unknown  hiccups  Other Reaction(s): Other (See Comments)     Review of Systems:  No headache, visual changes, nausea, vomiting, diarrhea, constipation, dizziness, abdominal pain, skin rash, fevers, chills, night sweats, weight loss, swollen lymph nodes, body aches, joint swelling, chest pain, shortness of breath, mood changes. POSITIVE muscle aches  Objective  Blood pressure 118/84, pulse 84, height 6\' 1"  (1.854 m), weight 240 lb (108.9 kg), SpO2 98%.   General: No apparent distress  alert and oriented x3 mood and affect normal, dressed appropriately.  HEENT: Pupils equal, extraocular movements intact  Respiratory: Patient's speak in full sentences and does not appear short of breath  Cardiovascular: No lower extremity edema, non tender, no erythema  Gait MSK:  Back low back does have some loss lordosis noted.  Some tenderness to palpation in the paraspinal musculature.  Tightness in [place] right greater than left.  Patient's right ankle is severely tender to touch.  Approximately 3 cm proximal to the lateral malleolus.  Osteopathic findings  C2 flexed rotated and side bent right C6 flexed rotated and side bent left T3 extended rotated and side bent right inhaled rib T6 extended rotated and side bent left L3 flexed rotated and side bent right Sacrum right on right       Assessment and Plan:  SI (sacroiliac) joint dysfunction Chronic problem, continue to work on core strengthening.  Has been traveling a lot.  Discussed icing regimen of home exercises.  Increase activity slowly.  Discussed icing regimen of home exercises.  Increase activity slowly.  Follow-up again in 6 to 8 weeks  Right ankle injury Ankle injury this seems to be more of a sural nerve injury.  Seems to be the superficial aspect.  Likely will just take time.  Able to do all other daily activities without any difficulty.  Discussed potential x-rays which patient declined I do not see any swelling or limping to concern.  Follow-up again in 6 weeks to make sure it completely resolves    Nonallopathic problems  Decision today to treat with OMT was based on Physical Exam  After verbal consent patient was treated with HVLA, ME, FPR techniques in cervical, rib, thoracic, lumbar, and sacral  areas  Patient tolerated the procedure well with improvement in symptoms  Patient given exercises, stretches and lifestyle modifications  See medications in patient instructions if given  Patient will follow up  in 4-8 weeks     The above documentation has been reviewed and is accurate and complete Chenae Brager M Arly Salminen, DO         Note: This dictation was prepared with Dragon dictation along with smaller phrase technology. Any transcriptional errors that result from this process are unintentional.

## 2023-05-11 ENCOUNTER — Ambulatory Visit (INDEPENDENT_AMBULATORY_CARE_PROVIDER_SITE_OTHER): Admitting: Family Medicine

## 2023-05-11 ENCOUNTER — Encounter: Payer: Self-pay | Admitting: Family Medicine

## 2023-05-11 VITALS — BP 118/84 | HR 84 | Ht 73.0 in | Wt 240.0 lb

## 2023-05-11 DIAGNOSIS — M533 Sacrococcygeal disorders, not elsewhere classified: Secondary | ICD-10-CM

## 2023-05-11 DIAGNOSIS — M9901 Segmental and somatic dysfunction of cervical region: Secondary | ICD-10-CM | POA: Diagnosis not present

## 2023-05-11 DIAGNOSIS — M9908 Segmental and somatic dysfunction of rib cage: Secondary | ICD-10-CM

## 2023-05-11 DIAGNOSIS — M9903 Segmental and somatic dysfunction of lumbar region: Secondary | ICD-10-CM

## 2023-05-11 DIAGNOSIS — S99911A Unspecified injury of right ankle, initial encounter: Secondary | ICD-10-CM | POA: Insufficient documentation

## 2023-05-11 DIAGNOSIS — M9902 Segmental and somatic dysfunction of thoracic region: Secondary | ICD-10-CM | POA: Diagnosis not present

## 2023-05-11 DIAGNOSIS — M9904 Segmental and somatic dysfunction of sacral region: Secondary | ICD-10-CM

## 2023-05-11 NOTE — Patient Instructions (Signed)
 Dont let people grab your ankle [facility] See me in 6 weeks

## 2023-05-11 NOTE — Assessment & Plan Note (Signed)
 Chronic problem, continue to work on core strengthening.  Has been traveling a lot.  Discussed icing regimen of home exercises.  Increase activity slowly.  Discussed icing regimen of home exercises.  Increase activity slowly.  Follow-up again in 6 to 8 weeks

## 2023-05-11 NOTE — Assessment & Plan Note (Signed)
 Ankle injury this seems to be more of a sural nerve injury.  Seems to be the superficial aspect.  Likely will just take time.  Able to do all other daily activities without any difficulty.  Discussed potential x-rays which patient declined I do not see any swelling or limping to concern.  Follow-up again in 6 weeks to make sure it completely resolves

## 2023-06-08 NOTE — Progress Notes (Unsigned)
 [facility] [address] [place] [address] Phone: [phone] Subjective:   Tony [name], am serving as a scribe for Dr. Ronnell Coins.  I'm seeing this patient by the request  of:  Almira Jaeger, MD  CC: Back and neck pain follow-up  [id number]  Tony [name] is a 43 y.o. male coming in with complaint of back and neck pain. OMT 05/11/2023. Patient states doing well. Ankle pain is coming and going, but getting better. No new symptoms or concerns.  Medications patient has been prescribed: None          Reviewed prior external information including notes and imaging from previsou exam, outside providers and external EMR if available.   As well as notes that were available from care everywhere and other healthcare systems.  Past medical history, social, surgical and family history all reviewed in electronic medical record.  No pertanent information unless stated regarding to the chief complaint.   Past Medical History:  Diagnosis Date   ALLERGIC RHINITIS 09/17/2009   ASTHMA 09/17/2009   HYPERLIPIDEMIA 09/17/2009    Allergies  Allergen Reactions   Augmentin  [Amoxicillin -Pot Clavulanate]     Stomach cramping/diarrhea   Doxycycline      Stomach cramping/diarrhea   Prednisolone Other (See Comments)   Prednisone  Other (See Comments)    Other reaction(s): Other (See Comments), Unknown  hiccups  Other Reaction(s): Other (See Comments)     Review of Systems:  No headache, visual changes, nausea, vomiting, diarrhea, constipation, dizziness, abdominal pain, skin rash, fevers, chills, night sweats, weight loss, swollen lymph nodes, body aches, joint swelling, chest pain, shortness of breath, mood changes. POSITIVE muscle aches  Objective  Blood pressure 118/82, height 6\' 1"  (1.854 m), weight 242 lb (109.8 kg).   General: No apparent distress alert and oriented x3 mood and affect normal, dressed appropriately.  HEENT: Pupils  equal, extraocular movements intact  Respiratory: Patient's speak in full sentences and does not appear short of breath  Cardiovascular: No lower extremity edema, non tender, no erythema  Gait MSK:  Back does have some loss lordosis noted.  Some tenderness to the patient in the paraspinal musculature.  Patient does have MRI imaging weakness of the hip abductors bilaterally.  Tight hip flexors noted bilaterally  Osteopathic findings  C2 flexed rotated and side bent right T6 extended rotated and side bent right inhaled rib L2 flexed rotated and side bent right L5 flexed rotated and side Continue withSacrum right on right       Assessment and Plan:  SI (sacroiliac) joint dysfunction Discussed icing regimen of home exercises, discussed which activities to do and which ones to avoid.  Increase activity slowly.  Discussed icing regimen.  Patient will be traveling again and I think he will do well.  Has muscle relaxers for breakthrough pain.  Follow-up with me again in 6 to 8 weeks for further evaluation and treatment    Nonallopathic problems  Decision today to treat with OMT was based on Physical Exam  After verbal consent patient was treated with HVLA, ME, FPR techniques in cervical, rib, thoracic, lumbar, and sacral  areas  Patient tolerated the procedure well with improvement in symptoms  Patient given exercises, stretches and lifestyle modifications  See medications in patient instructions if given  Patient will follow up in 4-8 weeks     The above documentation has been reviewed and is accurate and complete Tony Milligan M Abel Ra, DO  Note: This dictation was prepared with Dragon dictation along with smaller phrase technology. Any transcriptional errors that result from this process are unintentional.

## 2023-06-10 ENCOUNTER — Encounter: Payer: Self-pay | Admitting: Family Medicine

## 2023-06-10 ENCOUNTER — Ambulatory Visit: Admitting: Family Medicine

## 2023-06-10 VITALS — BP 118/82 | Ht 73.0 in | Wt 242.0 lb

## 2023-06-10 DIAGNOSIS — M9901 Segmental and somatic dysfunction of cervical region: Secondary | ICD-10-CM

## 2023-06-10 DIAGNOSIS — M9908 Segmental and somatic dysfunction of rib cage: Secondary | ICD-10-CM

## 2023-06-10 DIAGNOSIS — M9902 Segmental and somatic dysfunction of thoracic region: Secondary | ICD-10-CM | POA: Diagnosis not present

## 2023-06-10 DIAGNOSIS — M533 Sacrococcygeal disorders, not elsewhere classified: Secondary | ICD-10-CM

## 2023-06-10 DIAGNOSIS — M9904 Segmental and somatic dysfunction of sacral region: Secondary | ICD-10-CM | POA: Diagnosis not present

## 2023-06-10 DIAGNOSIS — M9903 Segmental and somatic dysfunction of lumbar region: Secondary | ICD-10-CM

## 2023-06-10 NOTE — Assessment & Plan Note (Signed)
 Discussed icing regimen of home exercises, discussed which activities to do and which ones to avoid.  Increase activity slowly.  Discussed icing regimen.  Patient will be traveling again and I think he will do well.  Has muscle relaxers for breakthrough pain.  Follow-up with me again in 6 to 8 weeks for further evaluation and treatment

## 2023-06-13 ENCOUNTER — Other Ambulatory Visit: Payer: Self-pay | Admitting: Family Medicine

## 2023-06-23 ENCOUNTER — Ambulatory Visit: Admitting: Family Medicine

## 2023-06-23 ENCOUNTER — Ambulatory Visit: Admitting: Physician Assistant

## 2023-06-23 ENCOUNTER — Encounter: Payer: Self-pay | Admitting: Physician Assistant

## 2023-06-23 VITALS — BP 126/85 | HR 78 | Temp 98.1°F | Ht 73.0 in | Wt 236.0 lb

## 2023-06-23 DIAGNOSIS — Z8739 Personal history of other diseases of the musculoskeletal system and connective tissue: Secondary | ICD-10-CM

## 2023-06-23 DIAGNOSIS — K219 Gastro-esophageal reflux disease without esophagitis: Secondary | ICD-10-CM

## 2023-06-23 DIAGNOSIS — R0600 Dyspnea, unspecified: Secondary | ICD-10-CM

## 2023-06-23 DIAGNOSIS — E785 Hyperlipidemia, unspecified: Secondary | ICD-10-CM

## 2023-06-23 DIAGNOSIS — I4510 Unspecified right bundle-branch block: Secondary | ICD-10-CM

## 2023-06-23 DIAGNOSIS — Z8249 Family history of ischemic heart disease and other diseases of the circulatory system: Secondary | ICD-10-CM | POA: Diagnosis not present

## 2023-06-23 DIAGNOSIS — Z131 Encounter for screening for diabetes mellitus: Secondary | ICD-10-CM

## 2023-06-23 MED ORDER — PANTOPRAZOLE SODIUM 40 MG PO TBEC: 40.0000 mg | DELAYED_RELEASE_TABLET | Freq: Two times a day (BID) | ORAL | 0 refills | Status: DC

## 2023-06-23 NOTE — Progress Notes (Signed)
 Patient ID: Sheehan Stacey, male    DOB: [birth year], 43 y.o.   MRN: [id number]   Assessment & Plan:  Dyspnea, unspecified type -     EKG 12-Lead -     CBC with Differential/Platelet; Future -     Comprehensive metabolic panel with GFR; Future -     Lipid panel; Future -     TSH; Future -     Hemoglobin A1c; Future -     Ambulatory referral to Cardiology  Personal history of Kawasaki's disease -     Ambulatory referral to Cardiology  Family history of early CAD -     Ambulatory referral to Cardiology  Gastroesophageal reflux disease, unspecified whether esophagitis present -     CBC with Differential/Platelet; Future -     Comprehensive metabolic panel with GFR; Future -     TSH; Future -     Hemoglobin A1c; Future  Right BB block -     Comprehensive metabolic panel with GFR; Future -     Lipid panel; Future -     Ambulatory referral to Cardiology  Dyslipidemia -     Lipid panel; Future -     Ambulatory referral to Cardiology  Screening for diabetes mellitus -     Comprehensive metabolic panel with GFR; Future -     Hemoglobin A1c; Future  Other orders -     Pantoprazole  Sodium; Take 1 tablet (40 mg total) by mouth 2 (two) times daily for 14 days.  Dispense: 28 tablet; Refill: 0      Assessment and Plan Assessment & Plan Gastroesophageal Reflux Disease (GERD) Chronic GERD with symptoms of acid reflux, including burping and discomfort after large meals. Managed with Protonix  40 mg once daily, but symptoms have worsened over the last couple of years. No associated pain, dysphagia, or weight loss. Family history of gastrointestinal issues. Dietary factors such as caffeine and fatty foods may exacerbate symptoms. Chronic acid exposure can lead to complications in the stomach and esophagus if uncontrolled. - Increase Protonix  to 40 mg twice daily for 1-2 weeks to assess symptom improvement - Advise dietary modifications to reduce caffeine and fatty food  intake  Asthma Long-standing asthma with occasional shortness of breath and cough with phlegm. Currently using albuterol  inhaler as needed and Singulair. Trelegy prescribed but not used daily due to perceived lack of efficacy. No recent exacerbations or wheezing. - Encourage daily use of Trelegy until follow-up with pulmonologist - Continue albuterol  inhaler as needed - Continue Singulair as prescribed  Cardiovascular Risk Assessment Family history of early heart disease with father's first heart attack around age 18. Symptoms of exertional dyspnea and family history warrant further investigation. EKG shows normal sinus rhythm with incomplete right bundle branch block, no previous EKG for comparison. Further evaluation by cardiology is recommended to rule out potential cardiac issues. - Perform EKG today - Refer to cardiology for further evaluation - Schedule follow-up with primary care for lab work and potential physical examination  General Health Maintenance Discussion of lifestyle factors including alcohol, caffeine, and dietary habits. No smoking history. Family history of heart disease. Advised on the importance of regular physical activity and dietary modifications to manage GERD and overall health. Emphasized the need for regular cardiovascular evaluations given family history. - Advise on regular physical activity and dietary modifications  Overdue for basic labs - will schedule future / fasting labs this week.  ER precautions discussed      Return for fasting labs this  week or next; 3 week f/up with [facility] .    Subjective:    Chief Complaint  Patient presents with   Gastroesophageal Reflux    Think its getting worse even with medication; mainly after a big meal.    Gastroesophageal Reflux   Discussed the use of AI scribe software for clinical note transcription with the patient, who gave verbal consent to proceed.  History of Present Illness Heitor Steinhoff is a 43 year old male with acid reflux and asthma who presents with possible worsening acid reflux symptoms. Here with his wife.  He has experienced acid reflux for several years, with symptoms worsening over the past couple of years. He takes Protonix  40 mg once daily, which helps manage his symptoms. He experiences discomfort and a sensation of fullness after large meals, sometimes accompanied by shortness of breath. No burning sensation or regurgitation, but increased burping is noted. There is a family history of acid reflux.  He has a history of asthma, managed with an albuterol  inhaler as needed, Singulair, and Trelegy, though he does not use Trelegy daily due to perceived ineffectiveness. He experiences cough with phlegm and notes that allergies can exacerbate his symptoms. He monitors his oxygen levels, which remain normal even when he feels short of breath.  He consumes alcohol moderately, drinks one mug of coffee daily, and occasionally consumes energy drinks and tea. He does not smoke and has no history of smoking. He occasionally uses NSAIDs but not regularly.  He has a family history of heart problems, with his father having had a heart attack around age 55. He has not had any recent cardiac evaluations but recalls a cardiology check-up many years ago due to a history of Kawasaki's disease as a child. No significant changes in weight, bowel habits, or urinary symptoms. No chest pain, nausea, vomiting, or difficulty swallowing.     Past Medical History:  Diagnosis Date   ALLERGIC RHINITIS 09/17/2009   ASTHMA 09/17/2009   HYPERLIPIDEMIA 09/17/2009    Past Surgical History:  Procedure Laterality Date   HIP SURGERY     Oct 2020   NASAL SINUS SURGERY Right 12/04/2014   Dr. Tellis Feathers   SINUS EXPLORATION  Nov 2014    per Dr. Tellis Feathers     Family History  Problem Relation Age of Onset   Healthy Mother    Sinusitis Father        recurrent- sinus surgery multiple. rare fungal  infection 20 cases in world.    CAD Father        age 25 first heart attack. never smoker   Alcohol abuse Father    Depression Father        abused pain pills in past   Atrial fibrillation Father    Kidney disease Father        lifestyle/treatment of sinus issues related   Stroke Father        related to a fib- watchman device   Dementia Father        vascular   Pneumonia Father        died of this 2021-08-18- long term battle since january- in SNF   Healthy Brother    Healthy Son    Healthy Son     Social History   Tobacco Use   Smoking status: Never   Smokeless tobacco: Never  Substance Use Topics   Alcohol use: Yes    Comment: occ   Drug use: No  Allergies  Allergen Reactions   Augmentin  [Amoxicillin -Pot Clavulanate]     Stomach cramping/diarrhea   Doxycycline      Stomach cramping/diarrhea   Prednisolone Other (See Comments)   Prednisone  Other (See Comments)    Other reaction(s): Other (See Comments), Unknown  hiccups  Other Reaction(s): Other (See Comments)    Review of Systems NEGATIVE UNLESS OTHERWISE INDICATED IN HPI      Objective:     BP 126/85   Pulse 78   Temp 98.1 F (36.7 C)   Ht 6' 1 (1.854 m)   Wt 236 lb (107 kg)   SpO2 98%   BMI 31.14 kg/m   Wt Readings from Last 3 Encounters:  06/23/23 236 lb (107 kg)  06/10/23 242 lb (109.8 kg)  05/11/23 240 lb (108.9 kg)    BP Readings from Last 3 Encounters:  06/23/23 126/85  06/10/23 118/82  05/11/23 118/84     Physical Exam Vitals and nursing note reviewed.  Constitutional:      General: He is not in acute distress.    Appearance: Normal appearance. He is obese. He is not ill-appearing or toxic-appearing.  HENT:     Head: Normocephalic and atraumatic.     Right Ear: External ear normal.     Left Ear: External ear normal.   Eyes:     Extraocular Movements: Extraocular movements intact.     Conjunctiva/sclera: Conjunctivae normal.     Pupils: Pupils are equal, round, and  reactive to light.    Cardiovascular:     Rate and Rhythm: Normal rate and regular rhythm.     Pulses: Normal pulses.     Heart sounds: Normal heart sounds.  Pulmonary:     Effort: Pulmonary effort is normal.     Breath sounds: Normal breath sounds.  Abdominal:     General: Abdomen is flat. Bowel sounds are normal.     Palpations: Abdomen is soft.     Tenderness: There is no abdominal tenderness.   Musculoskeletal:        General: Normal range of motion.     Cervical back: Normal range of motion and neck supple.   Skin:    General: Skin is warm and dry.   Neurological:     General: No focal deficit present.     Mental Status: He is alert and oriented to person, place, and time.   Psychiatric:        Mood and Affect: Mood normal.        Behavior: Behavior normal.             Shantanu Strauch M Waymond Meador, PA-C

## 2023-06-30 ENCOUNTER — Ambulatory Visit: Payer: Self-pay | Admitting: Physician Assistant

## 2023-06-30 ENCOUNTER — Other Ambulatory Visit (INDEPENDENT_AMBULATORY_CARE_PROVIDER_SITE_OTHER)

## 2023-06-30 DIAGNOSIS — R0600 Dyspnea, unspecified: Secondary | ICD-10-CM | POA: Diagnosis not present

## 2023-06-30 DIAGNOSIS — Z131 Encounter for screening for diabetes mellitus: Secondary | ICD-10-CM

## 2023-06-30 DIAGNOSIS — I4510 Unspecified right bundle-branch block: Secondary | ICD-10-CM

## 2023-06-30 DIAGNOSIS — E785 Hyperlipidemia, unspecified: Secondary | ICD-10-CM | POA: Diagnosis not present

## 2023-06-30 DIAGNOSIS — K219 Gastro-esophageal reflux disease without esophagitis: Secondary | ICD-10-CM | POA: Diagnosis not present

## 2023-06-30 LAB — COMPREHENSIVE METABOLIC PANEL WITH GFR
ALT: 52 U/L (ref 0–53)
AST: 28 U/L (ref 0–37)
Albumin: 4.4 g/dL (ref 3.5–5.2)
Alkaline Phosphatase: 74 U/L (ref 39–117)
BUN: 18 mg/dL (ref 6–23)
CO2: 28 meq/L (ref 19–32)
Calcium: 9.2 mg/dL (ref 8.4–10.5)
Chloride: 101 meq/L (ref 96–112)
Creatinine, Ser: 1.08 mg/dL (ref 0.40–1.50)
GFR: 85.56 mL/min (ref >60.00)
Glucose, Bld: 91 mg/dL (ref 70–99)
Potassium: 4 meq/L (ref 3.5–5.1)
Sodium: 136 meq/L (ref 135–145)
Total Bilirubin: 0.6 mg/dL (ref 0.2–1.2)
Total Protein: 7.2 g/dL (ref 6.0–8.3)

## 2023-06-30 LAB — LIPID PANEL
Cholesterol: 244 mg/dL — ABNORMAL HIGH (ref 0–200)
HDL: 39 mg/dL — ABNORMAL LOW (ref >39.00)
LDL Cholesterol: 167 mg/dL — ABNORMAL HIGH (ref 0–99)
NonHDL: 205.46
Total CHOL/HDL Ratio: 6
Triglycerides: 193 mg/dL — ABNORMAL HIGH (ref 0.0–149.0)
VLDL: 38.6 mg/dL (ref 0.0–40.0)

## 2023-06-30 LAB — CBC WITH DIFFERENTIAL/PLATELET
Basophils Absolute: 0 10*3/uL (ref 0.0–0.1)
Basophils Relative: 0.6 % (ref 0.0–3.0)
Eosinophils Absolute: 0.2 10*3/uL (ref 0.0–0.7)
Eosinophils Relative: 3 % (ref 0.0–5.0)
HCT: 48 % (ref 39.0–52.0)
Hemoglobin: 15.8 g/dL (ref 13.0–17.0)
Lymphocytes Relative: 33.7 % (ref 12.0–46.0)
Lymphs Abs: 1.9 10*3/uL (ref 0.7–4.0)
MCHC: 33 g/dL (ref 30.0–36.0)
MCV: 89.6 fl (ref 78.0–100.0)
Monocytes Absolute: 0.5 10*3/uL (ref 0.1–1.0)
Monocytes Relative: 8.1 % (ref 3.0–12.0)
Neutro Abs: 3.1 10*3/uL (ref 1.4–7.7)
Neutrophils Relative %: 54.6 % (ref 43.0–77.0)
Platelets: 183 10*3/uL (ref 150.0–400.0)
RBC: 5.36 Mil/uL (ref 4.22–5.81)
RDW: 13.6 % (ref 11.5–15.5)
WBC: 5.6 10*3/uL (ref 4.0–10.5)

## 2023-06-30 LAB — HEMOGLOBIN A1C: Hgb A1c MFr Bld: 5.8 % (ref 4.6–6.5)

## 2023-06-30 LAB — TSH: TSH: 1.44 u[IU]/mL (ref 0.35–5.50)

## 2023-07-09 NOTE — Progress Notes (Deleted)
  [facility] [address] [place] [address] Phone: [phone] Subjective:    I'm seeing this patient by the request  of:  Katrinka Garnette KIDD, MD  CC:   YEP:Dlagzrupcz  Tony [name] is a 43 y.o. male coming in with complaint of back and neck pain. OMT 06/10/2023. Patient states   Medications patient has been prescribed: None  Taking:         Reviewed prior external information including notes and imaging from previsou exam, outside providers and external EMR if available.   As well as notes that were available from care everywhere and other healthcare systems.  Past medical history, social, surgical and family history all reviewed in electronic medical record.  No pertanent information unless stated regarding to the chief complaint.   Past Medical History:  Diagnosis Date   ALLERGIC RHINITIS 09/17/2009   ASTHMA 09/17/2009   HYPERLIPIDEMIA 09/17/2009    Allergies  Allergen Reactions   Augmentin  [Amoxicillin -Pot Clavulanate]     Stomach cramping/diarrhea   Doxycycline      Stomach cramping/diarrhea   Prednisolone Other (See Comments)   Prednisone  Other (See Comments)    Other reaction(s): Other (See Comments), Unknown  hiccups  Other Reaction(s): Other (See Comments)     Review of Systems:  No headache, visual changes, nausea, vomiting, diarrhea, constipation, dizziness, abdominal pain, skin rash, fevers, chills, night sweats, weight loss, swollen lymph nodes, body aches, joint swelling, chest pain, shortness of breath, mood changes. POSITIVE muscle aches  Objective  There were no vitals taken for this visit.   General: No apparent distress alert and oriented x3 mood and affect normal, dressed appropriately.  HEENT: Pupils equal, extraocular movements intact  Respiratory: Patient's speak in full sentences and does not appear short of breath  Cardiovascular: No lower extremity edema, non tender, no erythema  Gait MSK:   Back   Osteopathic findings  C2 flexed rotated and side bent right C6 flexed rotated and side bent left T3 extended rotated and side bent right inhaled rib T9 extended rotated and side bent left L2 flexed rotated and side bent right Sacrum right on right       Assessment and Plan:  No problem-specific Assessment & Plan notes found for this encounter.    Nonallopathic problems  Decision today to treat with OMT was based on Physical Exam  After verbal consent patient was treated with HVLA, ME, FPR techniques in cervical, rib, thoracic, lumbar, and sacral  areas  Patient tolerated the procedure well with improvement in symptoms  Patient given exercises, stretches and lifestyle modifications  See medications in patient instructions if given  Patient will follow up in 4-8 weeks             Note: This dictation was prepared with Dragon dictation along with smaller phrase technology. Any transcriptional errors that result from this process are unintentional.

## 2023-07-15 ENCOUNTER — Encounter: Payer: Self-pay | Admitting: Family Medicine

## 2023-07-15 ENCOUNTER — Ambulatory Visit: Admitting: Family Medicine

## 2023-07-15 ENCOUNTER — Telehealth: Payer: Self-pay | Admitting: Family Medicine

## 2023-07-15 ENCOUNTER — Other Ambulatory Visit: Payer: Self-pay

## 2023-07-15 VITALS — BP 110/80 | HR 72 | Temp 97.7°F | Ht 73.0 in | Wt 230.0 lb

## 2023-07-15 DIAGNOSIS — E785 Hyperlipidemia, unspecified: Secondary | ICD-10-CM

## 2023-07-15 DIAGNOSIS — K219 Gastro-esophageal reflux disease without esophagitis: Secondary | ICD-10-CM

## 2023-07-15 DIAGNOSIS — Z0000 Encounter for general adult medical examination without abnormal findings: Secondary | ICD-10-CM

## 2023-07-15 MED ORDER — SCOPOLAMINE 1 MG/3DAYS TD PT72: 1.0000 | MEDICATED_PATCH | TRANSDERMAL | 1 refills | Status: AC

## 2023-07-15 NOTE — Patient Instructions (Addendum)
 Call cardiology [phone] to schedule follow up- referral has gone through  Trial pantoprazole  before dinner- that may help even more -new or worsening symptoms see us  back  Lets work on healthy eating and regular exercise / weight loss to try to reduce diabetes risk with new found prediabetes and with cholesterol jump  Recommended follow up: Return in about 6 months (around 01/15/2024) for physical or sooner if needed.Schedule b4 you leave.

## 2023-07-15 NOTE — Telephone Encounter (Signed)
 Patient requesting to have labwork prior to CPE on Jan 29. No future labs have been ordered . Please advise

## 2023-07-15 NOTE — Progress Notes (Signed)
 Phone [phone] In person visit   Subjective:   Tony [name] is a 43 y.o. year old very pleasant male patient who presents for/with See problem oriented charting Chief Complaint  Patient presents with   3 week f/u    Past Medical History-  Patient Active Problem List   Diagnosis Date Noted   Dyslipidemia 09/17/2009    Priority: Medium    Allergic rhinitis 09/17/2009    Priority: Medium    Asthma 09/17/2009    Priority: Medium    IBS-D 10/11/2015    Priority: Low   Right ankle injury 05/11/2023   Acute recurrent pansinusitis 01/27/2023   GERD (gastroesophageal reflux disease) 09/30/2022   Labral tear of right hip joint 01/28/2018   Right hip pain 10/13/2017   Quadriceps tendinitis 11/06/2015   SI (sacroiliac) joint dysfunction 11/06/2015   Nonallopathic lesion of sacral region 11/06/2015   Nonallopathic lesion of lumbosacral region 11/06/2015   Nonallopathic lesion of thoracic region 11/06/2015   Acute cough 04/19/2014    Medications- reviewed and updated Current Outpatient Medications  Medication Sig Dispense Refill   albuterol  (VENTOLIN  HFA) 108 (90 Base) MCG/ACT inhaler TAKE 2 PUFFS BY MOUTH EVERY 6 HOURS AS NEEDED FOR WHEEZE OR SHORTNESS OF BREATH 8.5 each 4   Ascorbic Acid (VITAMIN C) 1000 MG tablet Take 1,000 mg by mouth daily.     EPINEPHrine  0.3 mg/0.3 mL IJ SOAJ injection as directed.     fexofenadine (ALLEGRA) 180 MG tablet Take 180 mg by mouth daily.     fluticasone  (FLONASE) 50 MCG/ACT nasal spray Place 2 sprays into the nose daily.     montelukast (SINGULAIR) 10 MG tablet Take 10 mg by mouth daily.     Multiple Vitamin (MULTI-VITAMIN) tablet Take 1 tablet by mouth daily.     ondansetron  (ZOFRAN -ODT) 4 MG disintegrating tablet Take 1 tablet (4 mg total) by mouth every 8 (eight) hours as needed for nausea or vomiting. 20 tablet 0   pantoprazole  (PROTONIX ) 40 MG tablet TAKE 1 TABLET BY MOUTH EVERY DAY 90 tablet 3   scopolamine  (TRANSDERM-SCOP) 1  MG/3DAYS Place 1 patch (1.5 mg total) onto the skin every 3 (three) days. For travel 10 patch 1   pantoprazole  (PROTONIX ) 40 MG tablet Take 1 tablet (40 mg total) by mouth 2 (two) times daily for 14 days. 28 tablet 0   TRELEGY ELLIPTA 200-62.5-25 MCG/ACT AEPB Inhale 1 puff into the lungs daily.     No current facility-administered medications for this visit.     Objective:  BP 110/80   Pulse 72   Temp 97.7 F (36.5 C)   Ht 6' 1 (1.854 m)   Wt 230 lb (104.3 kg)   SpO2 98%   BMI 30.34 kg/m  Gen: NAD, resting comfortably CV: RRR no murmurs rubs or gallops Lungs: CTAB no crackles, wheeze, rhonchi Abdomen: soft/nontender/nondistended/normal bowel sounds. No rebound or guarding.  Ext: no edema Skin: warm, dry     Assessment and Plan   # GERD  shortness of breath follow-up S: Patient was seen on 06/23/2023 by Mardy Dyke, PA in our office.   -Patient complained of potential acid reflux symptoms worsening over the last couple years including burping and discomfort after large meal-Protonix  was increased to twice daily for 1 to 2 weeks and encouraged dietary changes.  At times would have shortness of breath with this as well.  He did check his oxygen levels when feeling short of breath and these were not decreased -Patient does have history  of asthma and was taking Singulair and albuterol  but had not taken Trelegy previously prescribed so was encouraged for him to trial this  -Reassuring EKG other than right bundle branch block noted, CBC, CMP, TSH.  A1c in prediabetes range.  Referred to cardiology particularly with family history of early heart disease in father with heart attack around age 52 though he was an alcoholic and abused prescription drugs.  Patient also personally had history of Kawasaki's as a child -He has not heard from cardiology yet  He tried the protonix  twice a day for 2 days but caused diarrhea and had to stop. He has maintained once a day- symptoms have settled  though and only had one less intense episode and not really feeling short of breath.  A/P: GERD symptoms seem to settle back down even with just 2 days of Protonix  twice a day.  He is taking the Protonix  at bedtime I encouraged him to take before dinner to see if it is even more beneficial.  He did not tolerate twice a day due to diarrhea so we will remain on once a day.  We discussed could use another dose of Pepcid in the morning if needed and less likely to have same side effects.  The shortness of breath seems to be a minimal issue at this point  As far as referral to cardiology with family history of early CAD in father at 53 (though much different lifestyle) he is going to follow through with cardiology visit.  See discussion below about lipid management -Expressed I was not overly concerned about right bundle branch block  #hyperlipidemia S: Medication: None The 10-year ASCVD risk score (Arnett DK, et al., 2019) is: 2% Lab Results  Component Value Date   CHOL 244 (H) 06/30/2023   HDL 39.00 (L) 06/30/2023   LDLCALC 167 (H) 06/30/2023   LDLDIRECT 153.0 08/12/2017   TRIG 193.0 (H) 06/30/2023   CHOLHDL 6 06/30/2023   A/P: Cholesterols really jumped up but patient has just come back from a [place] vacation and that likely contributed-we discussed work on healthy eating/regular exercise/weight loss to try to lower overall risk -She asked about a statin and I would like to see how his cardiology evaluation goes first and if there is any evidence of plaque that would certainly lower the threshold to start more formal treatment  Recommended follow up: Return in about 6 months (around 01/15/2024) for physical or sooner if needed.Schedule b4 you leave. Future Appointments  Date Time Provider Department Center  07/21/2023  9:15 AM Claudene Arthea HERO, DO [facility] None  02/04/2024  9:00 AM Katrinka Garnette KIDD, MD [facility]    Lab/Order associations:   ICD-10-CM   1. Gastroesophageal reflux disease,  unspecified whether esophagitis present  K21.9     2. Dyslipidemia  E78.5       Meds ordered this encounter  Medications   scopolamine  (TRANSDERM-SCOP) 1 MG/3DAYS    Sig: Place 1 patch (1.5 mg total) onto the skin every 3 (three) days. For travel    Dispense:  10 patch    Refill:  1    Return precautions advised.  Garnette Katrinka, MD

## 2023-07-15 NOTE — Telephone Encounter (Signed)
 Future labs placed, ok to schedule lab visit.

## 2023-07-16 ENCOUNTER — Ambulatory Visit: Admitting: Cardiology

## 2023-07-16 ENCOUNTER — Encounter: Payer: Self-pay | Admitting: Family Medicine

## 2023-07-21 ENCOUNTER — Ambulatory Visit: Admitting: Family Medicine

## 2023-09-02 ENCOUNTER — Telehealth: Payer: Self-pay | Admitting: *Deleted

## 2023-09-02 NOTE — Telephone Encounter (Signed)
 LMOVM to verify card hx.

## 2023-09-07 NOTE — Progress Notes (Unsigned)
 Cardiology Office Note  Date:  09/08/2023   ID:  Cadden Elizondo, DOB 03-Feb-1980, MRN [id number]  PCP:  Katrinka Garnette KIDD, MD   Chief Complaint  Patient presents with   New Patient (Initial Visit)    Ref by Mardy Dyke, PA-C for shortness of breath & a history of Kawasaki's disease.      HPI:  Josemanuel Eakins a 43 y.o. 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 Family history of heart disease Hyperlipidemia GERD Asthma Who presents by referral from Alyssa Allwardt for consultation of his shortness of breath, family history of cardiac disease  In follow-up reports doing well No regular exercise program In the recent past reports having some SOB after eating, a sense of fullness  Symptoms seem to have improved on PPI daily  No SOB or chest pain on exertion Works as a homicide Archivist in [place]  No prior cardiac imaging available  Lab work reviewed Total cholesterol 244 LDL 167 A1c 5.8  Family history early heart disease with father's first heart attack around age 19, prior PCIs, depression ETOH, ablations, pacer, vascular dementia,  Died age 68    EKG personally reviewed by myself on todays visit EKG Interpretation Date/Time:  Tuesday September 08 2023 08:53:17 EDT Ventricular Rate:  82 PR Interval:  152 QRS Duration:  92 QT Interval:  368 QTC Calculation: 429 R Axis:   4  Text Interpretation: Normal sinus rhythm Normal ECG No previous ECGs available Confirmed by Perla Lye [phone]) on 09/08/2023 8:59:20 AM    PMH:   has a past medical history of ALLERGIC RHINITIS (09/17/2009), ASTHMA (09/17/2009), and HYPERLIPIDEMIA (09/17/2009).  PSH:    Past Surgical History:  Procedure Laterality Date   HIP SURGERY     Oct 2020   NASAL SINUS SURGERY Right 12/04/2014   Dr. Carlie   SINUS EXPLORATION  Nov 2014    per Dr. Carlie     Current Outpatient Medications  Medication Sig Dispense Refill   albuterol  (VENTOLIN  HFA) 108 (90 Base)  MCG/ACT inhaler TAKE 2 PUFFS BY MOUTH EVERY 6 HOURS AS NEEDED FOR WHEEZE OR SHORTNESS OF BREATH 8.5 each 4   Ascorbic Acid (VITAMIN C) 1000 MG tablet Take 1,000 mg by mouth daily.     EPINEPHrine  0.3 mg/0.3 mL IJ SOAJ injection as directed.     fexofenadine (ALLEGRA) 180 MG tablet Take 180 mg by mouth daily.     fluticasone  (FLONASE) 50 MCG/ACT nasal spray Place 2 sprays into the nose daily.     montelukast (SINGULAIR) 10 MG tablet Take 10 mg by mouth daily.     Multiple Vitamin (MULTI-VITAMIN) tablet Take 1 tablet by mouth daily.     ondansetron  (ZOFRAN -ODT) 4 MG disintegrating tablet Take 1 tablet (4 mg total) by mouth every 8 (eight) hours as needed for nausea or vomiting. 20 tablet 0   pantoprazole  (PROTONIX ) 40 MG tablet TAKE 1 TABLET BY MOUTH EVERY DAY 90 tablet 3   pantoprazole  (PROTONIX ) 40 MG tablet Take 1 tablet (40 mg total) by mouth 2 (two) times daily for 14 days. 28 tablet 0   scopolamine  (TRANSDERM-SCOP) 1 MG/3DAYS Place 1 patch (1.5 mg total) onto the skin every 3 (three) days. For travel 10 patch 1   TRELEGY ELLIPTA 200-62.5-25 MCG/ACT AEPB Inhale 1 puff into the lungs daily.     No current facility-administered medications for this visit.     Allergies:   Augmentin  [amoxicillin -pot clavulanate], Doxycycline , Doxycycline  hyclate, Prednisolone, and Prednisone   Social History:  The patient  reports that he has never smoked. He has never used smokeless tobacco. He reports current alcohol use of about 3.0 standard drinks of alcohol per week. He reports that he does not use drugs.   Family History:   family history includes Alcohol abuse in his father; Atrial fibrillation in his father; CAD in his father; Dementia in his father; Depression in his father; Healthy in his brother, mother, son, and son; Kidney disease in his father; Pneumonia in his father; Sinusitis in his father; Stroke in his father.    Review of Systems: Review of Systems  Constitutional: Negative.   HENT:  Negative.    Respiratory: Negative.    Cardiovascular: Negative.   Gastrointestinal: Negative.   Musculoskeletal: Negative.   Neurological: Negative.   Psychiatric/Behavioral: Negative.    All other systems reviewed and are negative.   PHYSICAL EXAM: VS:  BP (!) 120/92 (BP Location: Right Arm, Patient Position: Sitting, Cuff Size: Normal)   Pulse 82   Ht 6' 1 (1.854 m)   Wt 235 lb 4 oz (106.7 kg)   SpO2 99%   BMI 31.04 kg/m  , BMI Body mass index is 31.04 kg/m. GEN: Well nourished, well developed, in no acute distress HEENT: normal Neck: no JVD, carotid bruits, or masses Cardiac: RRR; no murmurs, rubs, or gallops,no edema  Respiratory:  clear to auscultation bilaterally, normal work of breathing GI: soft, nontender, nondistended, + BS MS: no deformity or atrophy Skin: warm and dry, no rash Neuro:  Strength and sensation are intact Psych: euthymic mood, full affect  Recent Labs: 06/30/2023: ALT 52; BUN 18; Creatinine, Ser 1.08; Hemoglobin 15.8; Platelets 183.0; Potassium 4.0; Sodium 136; TSH 1.44    Lipid Panel Lab Results  Component Value Date   CHOL 244 (H) 06/30/2023   HDL 39.00 (L) 06/30/2023   LDLCALC 167 (H) 06/30/2023   TRIG 193.0 (H) 06/30/2023      Wt Readings from Last 3 Encounters:  09/08/23 235 lb 4 oz (106.7 kg)  07/15/23 230 lb (104.3 kg)  06/23/23 236 lb (107 kg)     ASSESSMENT AND PLAN:  Problem List Items Addressed This Visit     GERD (gastroesophageal reflux disease)   Dyslipidemia   Other Visit Diagnoses       Shortness of breath    -  Primary   Relevant Orders   EKG 12-Lead (Completed)      Shortness of breath Atypical in nature, presenting after eating a large meal Symptoms seem to have improved after adding PPI, reports no recent episodes Recommend he start exercise program and if he develops worsening shortness of breath or chest pressure with exertion, suggested he call our office -Will hold off on echocardiogram at this time  if symptoms are resolved  Hyperlipidemia Strong family history of cardiac disease in father Recommended calcium scoring He prefers not to be on medication at this time He will have repeat lipid panel with primary care early next year 2026 For elevated calcium score may want to start medications earlier  Strong family history cardiac disease Father with coronary disease, prior intervention, pacer, ablation, details discussed  Patient seen in consultation for Dr. Katrinka and will be referred back to his office for ongoing care of the issues detailed above  Signed, Velinda Lunger, M.D., Ph.D. [facility] [place], [place] [phone]

## 2023-09-08 ENCOUNTER — Ambulatory Visit: Attending: Cardiovascular Disease | Admitting: Cardiovascular Disease

## 2023-09-08 ENCOUNTER — Encounter: Payer: Self-pay | Admitting: Cardiovascular Disease

## 2023-09-08 VITALS — BP 120/92 | HR 82 | Ht 73.0 in | Wt 235.2 lb

## 2023-09-08 DIAGNOSIS — K219 Gastro-esophageal reflux disease without esophagitis: Secondary | ICD-10-CM | POA: Diagnosis not present

## 2023-09-08 DIAGNOSIS — E785 Hyperlipidemia, unspecified: Secondary | ICD-10-CM

## 2023-09-08 DIAGNOSIS — R0602 Shortness of breath: Secondary | ICD-10-CM | POA: Diagnosis not present

## 2023-09-08 NOTE — Addendum Note (Signed)
 Addended by: CRISTOPHER OLIVIA PARAS on: 09/08/2023 09:30 AM   Modules accepted: Orders

## 2023-09-08 NOTE — Addendum Note (Signed)
 Addended by: CRISTOPHER OLIVIA PARAS on: 09/08/2023 09:41 AM   Modules accepted: Orders

## 2023-09-08 NOTE — Patient Instructions (Addendum)
Medication Instructions:  No changes  If you need a refill on your cardiac medications before your next appointment, please call your pharmacy.   Lab work: No new labs needed  Testing/Procedures: CT coronary calcium score.   - $99 out of pocket cost at the time of your test - Call [phone] to schedule at your convenience.  [facility] [address] , [place] [address]   Coronary CalciumScan A coronary calcium scan is an imaging test used to look for deposits of calcium and other fatty materials (plaques) in the inner lining of the blood vessels of the heart (coronary arteries). These deposits of calcium and plaques can partly clog and narrow the coronary arteries without producing any symptoms or warning signs. This puts a person at risk for a heart attack. This test can detect these deposits before symptoms develop. Tell a health care provider about: Any allergies you have. All medicines you are taking, including vitamins, herbs, eye drops, creams, and over-the-counter medicines. Any problems you or family members have had with anesthetic medicines. Any blood disorders you have. Any surgeries you have had. Any medical conditions you have. Whether you are pregnant or may be pregnant. What are the risks? Generally, this is a safe procedure. However, problems may occur, including: Harm to a pregnant woman and her unborn baby. This test involves the use of radiation. Radiation exposure can be dangerous to a pregnant woman and her unborn baby. If you are pregnant, you generally should not have this procedure done. Slight increase in the risk of cancer. This is because of the radiation involved in the test. What happens before the procedure? No preparation is needed for this procedure. What happens during the procedure? You will undress and remove any jewelry around your neck or chest. You will put on a hospital gown. Sticky  electrodes will be placed on your chest. The electrodes will be connected to an electrocardiogram (ECG) machine to record a tracing of the electrical activity of your heart. A CT scanner will take pictures of your heart. During this time, you will be asked to lie still and hold your breath for 2-3 seconds while a picture of your heart is being taken. The procedure may vary among health care providers and hospitals. What happens after the procedure? You can get dressed. You can return to your normal activities. It is up to you to get the results of your test. Ask your health care provider, or the department that is doing the test, when your results will be ready. Summary A coronary calcium scan is an imaging test used to look for deposits of calcium and other fatty materials (plaques) in the inner lining of the blood vessels of the heart (coronary arteries). Generally, this is a safe procedure. Tell your health care provider if you are pregnant or may be pregnant. No preparation is needed for this procedure. A CT scanner will take pictures of your heart. You can return to your normal activities after the scan is done. This information is not intended to replace advice given to you by your health care provider. Make sure you discuss any questions you have with your health care provider. Document Released: 06/21/2007 Document Revised: 11/12/2015 Document Reviewed: 11/12/2015 Elsevier Interactive Patient Education  2017 Elsevier Inc.  Follow-Up: At [facility], you and your health needs are our priority.  As part of our continuing mission to provide you with exceptional heart care, we have created designated Provider Care   Teams.  These Care Teams include your primary Cardiologist (physician) and Advanced Practice Providers (APPs -  Physician Assistants and Nurse Practitioners) who all work together to provide you with the care you need, when you need it.  You will need a follow up appointment as  needed  Providers on your designated Care Team:   Christopher Berge, NP Ryan Dunn, PA-C Cadence Furth, PA-C  COVID-19 Vaccine Information can be found at: [email or web address] For questions related to vaccine distribution or appointments, please email [email or web address] or call [phone].   

## 2023-10-20 ENCOUNTER — Ambulatory Visit (HOSPITAL_BASED_OUTPATIENT_CLINIC_OR_DEPARTMENT_OTHER)
Admission: RE | Admit: 2023-10-20 | Discharge: 2023-10-20 | Disposition: A | Discharge status: Home / Self Care | Payer: Self-pay | Source: Ambulatory Visit | Attending: Cardiovascular Disease | Admitting: Cardiovascular Disease

## 2023-10-20 DIAGNOSIS — E785 Hyperlipidemia, unspecified: Secondary | ICD-10-CM | POA: Insufficient documentation

## 2023-10-24 ENCOUNTER — Ambulatory Visit: Payer: Self-pay | Admitting: Cardiovascular Disease

## 2023-11-04 ENCOUNTER — Telehealth: Payer: Self-pay | Admitting: Family Medicine

## 2023-11-04 NOTE — Telephone Encounter (Signed)
 LVM TO RS 02/04/24

## 2023-11-12 NOTE — Telephone Encounter (Signed)
 HAS BEEN RS

## 2023-11-13 ENCOUNTER — Telehealth: Payer: Self-pay | Admitting: Family Medicine

## 2023-11-13 NOTE — Telephone Encounter (Signed)
 Spoke with patient, labs rescheduled to 11/19/2023.    Copied from CRM #[id number]. Topic: Clinical - Medication Question >> Nov 13, 2023 11:11 AM Alfonso ORN wrote: Reason for CRM: pt is wanting to confirm if he has to fast and what time period for upcoming lab visit. Please advise

## 2023-11-16 ENCOUNTER — Other Ambulatory Visit

## 2023-11-19 ENCOUNTER — Other Ambulatory Visit (INDEPENDENT_AMBULATORY_CARE_PROVIDER_SITE_OTHER)

## 2023-11-19 ENCOUNTER — Ambulatory Visit: Payer: Self-pay | Admitting: Family Medicine

## 2023-11-19 DIAGNOSIS — E785 Hyperlipidemia, unspecified: Secondary | ICD-10-CM

## 2023-11-19 DIAGNOSIS — Z0000 Encounter for general adult medical examination without abnormal findings: Secondary | ICD-10-CM | POA: Diagnosis not present

## 2023-11-19 LAB — COMPREHENSIVE METABOLIC PANEL WITH GFR
ALT: 60 U/L — ABNORMAL HIGH (ref 0–53)
AST: 28 U/L (ref 0–37)
Albumin: 4.3 g/dL (ref 3.5–5.2)
Alkaline Phosphatase: 64 U/L (ref 39–117)
BUN: 12 mg/dL (ref 6–23)
CO2: 29 meq/L (ref 19–32)
Calcium: 8.9 mg/dL (ref 8.4–10.5)
Chloride: 101 meq/L (ref 96–112)
Creatinine, Ser: 1.04 mg/dL (ref 0.40–1.50)
GFR: 89.28 mL/min (ref >60.00)
Glucose, Bld: 93 mg/dL (ref 70–99)
Potassium: 3.9 meq/L (ref 3.5–5.1)
Sodium: 138 meq/L (ref 135–145)
Total Bilirubin: 0.5 mg/dL (ref 0.2–1.2)
Total Protein: 6.9 g/dL (ref 6.0–8.3)

## 2023-11-19 LAB — CBC WITH DIFFERENTIAL/PLATELET
Basophils Absolute: 0 K/uL (ref 0.0–0.1)
Basophils Relative: 0.5 % (ref 0.0–3.0)
Eosinophils Absolute: 0.2 K/uL (ref 0.0–0.7)
Eosinophils Relative: 4.6 % (ref 0.0–5.0)
HCT: 45 % (ref 39.0–52.0)
Hemoglobin: 15.2 g/dL (ref 13.0–17.0)
Lymphocytes Relative: 37.2 % (ref 12.0–46.0)
Lymphs Abs: 2 K/uL (ref 0.7–4.0)
MCHC: 33.6 g/dL (ref 30.0–36.0)
MCV: 88.9 fl (ref 78.0–100.0)
Monocytes Absolute: 0.4 K/uL (ref 0.1–1.0)
Monocytes Relative: 7.6 % (ref 3.0–12.0)
Neutro Abs: 2.7 K/uL (ref 1.4–7.7)
Neutrophils Relative %: 50.1 % (ref 43.0–77.0)
Platelets: 191 K/uL (ref 150.0–400.0)
RBC: 5.07 Mil/uL (ref 4.22–5.81)
RDW: 13.4 % (ref 11.5–15.5)
WBC: 5.4 K/uL (ref 4.0–10.5)

## 2023-11-19 LAB — LIPID PANEL
Cholesterol: 253 mg/dL — ABNORMAL HIGH (ref 0–200)
HDL: 40.6 mg/dL (ref >39.00)
LDL Cholesterol: 174 mg/dL — ABNORMAL HIGH (ref 0–99)
NonHDL: 212.39
Total CHOL/HDL Ratio: 6
Triglycerides: 194 mg/dL — ABNORMAL HIGH (ref 0.0–149.0)
VLDL: 38.8 mg/dL (ref 0.0–40.0)

## 2023-11-19 LAB — TSH: TSH: 1.61 u[IU]/mL (ref 0.35–5.50)

## 2023-11-24 ENCOUNTER — Encounter: Payer: Self-pay | Admitting: Family Medicine

## 2023-11-24 ENCOUNTER — Ambulatory Visit (INDEPENDENT_AMBULATORY_CARE_PROVIDER_SITE_OTHER): Admitting: Family Medicine

## 2023-11-24 VITALS — BP 128/82 | HR 88 | Temp 97.7°F | Ht 73.0 in | Wt 238.4 lb

## 2023-11-24 DIAGNOSIS — Z0000 Encounter for general adult medical examination without abnormal findings: Secondary | ICD-10-CM | POA: Diagnosis not present

## 2023-11-24 DIAGNOSIS — J4541 Moderate persistent asthma with (acute) exacerbation: Secondary | ICD-10-CM | POA: Diagnosis not present

## 2023-11-24 DIAGNOSIS — Z79899 Other long term (current) drug therapy: Secondary | ICD-10-CM | POA: Diagnosis not present

## 2023-11-24 DIAGNOSIS — E785 Hyperlipidemia, unspecified: Secondary | ICD-10-CM

## 2023-11-24 DIAGNOSIS — Z131 Encounter for screening for diabetes mellitus: Secondary | ICD-10-CM

## 2023-11-24 DIAGNOSIS — R7303 Prediabetes: Secondary | ICD-10-CM

## 2023-11-24 NOTE — Patient Instructions (Addendum)
 Thanks for already doing labs  Lets work on healthy eating and regular exercise with goal to avoid medicine in long term. I think working towards 200-210 over next 1-2 years is very reasonable  Recommended follow up: Return in about 1 year (around 11/23/2024) for physical or sooner if needed.Schedule b4 you leave.

## 2023-11-24 NOTE — Progress Notes (Signed)
 Phone: [phone]    Subjective:  Patient presents today for their annual physical. Chief complaint-noted.   See problem oriented charting- ROS- full  review of systems was completed and negative  except for topics noted under acute/chronic concerns  The following were reviewed and entered/updated in epic: Past Medical History:  Diagnosis Date   ALLERGIC RHINITIS 09/17/2009   ASTHMA 09/17/2009   HYPERLIPIDEMIA 09/17/2009   Patient Active Problem List   Diagnosis Date Noted   Dyslipidemia 09/17/2009    Priority: Medium    Allergic rhinitis 09/17/2009    Priority: Medium    Asthma 09/17/2009    Priority: Medium    IBS-D 10/11/2015    Priority: Low   Right ankle injury 05/11/2023   Acute recurrent pansinusitis 01/27/2023   GERD (gastroesophageal reflux disease) 09/30/2022   Labral tear of right hip joint 01/28/2018   Right hip pain 10/13/2017   Quadriceps tendinitis 11/06/2015   SI (sacroiliac) joint dysfunction 11/06/2015   Nonallopathic lesion of sacral region 11/06/2015   Nonallopathic lesion of lumbosacral region 11/06/2015   Nonallopathic lesion of thoracic region 11/06/2015   Acute cough 04/19/2014   Past Surgical History:  Procedure Laterality Date   HIP SURGERY     Oct 2020   NASAL SINUS SURGERY Right 12/04/2014   Dr. Carlie   SINUS EXPLORATION  Nov 2014    per Dr. Carlie     Family History  Problem Relation Age of Onset   Healthy Mother    Sinusitis Father        recurrent- sinus surgery multiple. rare fungal infection 20 cases in world.    CAD Father        age 37 first heart attack. never smoker   Alcohol abuse Father    Depression Father        abused pain pills in past   Atrial fibrillation Father    Kidney disease Father        lifestyle/treatment of sinus issues related   Stroke Father        related to a fib- watchman device   Dementia Father        vascular   Pneumonia Father        died of this Aug 09, 2021- long term battle since january-  in SNF   Healthy Brother    Healthy Son    Healthy Son     Medications- reviewed and updated Current Outpatient Medications  Medication Sig Dispense Refill   albuterol  (VENTOLIN  HFA) 108 (90 Base) MCG/ACT inhaler TAKE 2 PUFFS BY MOUTH EVERY 6 HOURS AS NEEDED FOR WHEEZE OR SHORTNESS OF BREATH 8.5 each 4   Ascorbic Acid (VITAMIN C) 1000 MG tablet Take 1,000 mg by mouth daily.     clarithromycin (BIAXIN) 500 MG tablet 1 tablet Orally every 12 hrs; Duration: 10 days     EPINEPHrine  0.3 mg/0.3 mL IJ SOAJ injection as directed.     fexofenadine (ALLEGRA) 180 MG tablet Take 180 mg by mouth daily.     fluticasone  (FLONASE) 50 MCG/ACT nasal spray Place 2 sprays into the nose daily.     levocetirizine (XYZAL) 5 MG tablet 1 tablet in the evening Orally Once a day; Duration: 30 days     montelukast (SINGULAIR) 10 MG tablet Take 10 mg by mouth daily.     Multiple Vitamin (MULTI-VITAMIN) tablet Take 1 tablet by mouth daily.     ondansetron  (ZOFRAN -ODT) 4 MG disintegrating tablet Take 1 tablet (4 mg total) by mouth every 8 (  eight) hours as needed for nausea or vomiting. 20 tablet 0   pantoprazole  (PROTONIX ) 40 MG tablet TAKE 1 TABLET BY MOUTH EVERY DAY 90 tablet 3   pantoprazole  (PROTONIX ) 40 MG tablet Take 1 tablet (40 mg total) by mouth 2 (two) times daily for 14 days. 28 tablet 0   scopolamine  (TRANSDERM-SCOP) 1 MG/3DAYS Place 1 patch (1.5 mg total) onto the skin every 3 (three) days. For travel 10 patch 1   TRELEGY ELLIPTA 200-62.5-25 MCG/ACT AEPB Inhale 1 puff into the lungs daily.     No current facility-administered medications for this visit.    Allergies-reviewed and updated Allergies  Allergen Reactions   Augmentin  [Amoxicillin -Pot Clavulanate]     Stomach cramping/diarrhea   Doxycycline      Stomach cramping/diarrhea   Doxycycline  Hyclate Diarrhea   Prednisolone Other (See Comments)   Prednisone  Other (See Comments)    Other reaction(s): Other (See Comments),  Unknown  hiccups  Other Reaction(s): Other (See Comments)    Social History   Social History Narrative   Remarried may 2024. 2 children from first marriage and she has 2 children as well. Fonda (May 2016). Jacob (July 2013). 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       Hobbies: time with sons, going to gym, disney trips with wife and family   [facility]      Objective:  BP 128/82 (BP Location: Left Arm, Patient Position: Sitting, Cuff Size: Normal)   Pulse 88   Temp 97.7 F (36.5 C) (Temporal)   Ht 6' 1 (1.854 m)   Wt 238 lb 6.4 oz (108.1 kg)   SpO2 98%   BMI 31.45 kg/m  Gen: NAD, resting comfortably HEENT: Mucous membranes are moist. Oropharynx normal Neck: no thyromegaly CV: RRR no murmurs rubs or gallops Lungs: CTAB no crackles, wheeze, rhonchi Abdomen: soft/nontender/nondistended/normal bowel sounds. No rebound or guarding.  Ext: no edema Skin: warm, dry Neuro: grossly normal, moves all extremities, PERRLA    Assessment and Plan:  43 y.o. male presenting for annual physical.  Health Maintenance counseling: 1. Anticipatory guidance: Patient counseled regarding regular dental exams -q6 months, eye exams - yearly with astigmatism,  avoiding smoking and second hand smoke , limiting alcohol to 2 beverages per day, no illicit drugs.   2. Risk factor reduction:  Advised patient of need for regular exercise and diet rich and fruits and vegetables to reduce risk of heart attack and stroke.  Exercise- planning to start with the gym first thing in the morning- even starting with walking.  Diet/weight management-gradul weight increase- he plans to try to reverse by improving his diet.  Wt Readings from Last 3 Encounters:  11/24/23 238 lb 6.4 oz (108.1 kg)  09/08/23 235 lb 4 oz (106.7 kg)  07/15/23 230 lb (104.3 kg)  3. Immunizations/screenings/ancillary studies- opts out flu, Prevnar, HPV, covid Immunization History   Administered Date(s) Administered   INFLUENZA, HIGH DOSE SEASONAL PF 07/23/2016, 10/13/2016, 12/17/2018   Influenza Split 09/18/2011, 11/05/2015   Influenza,inj,Quad PF,6+ Mos 11/02/2012, 01/25/2014, 10/20/2014, 10/14/2018, 11/09/2019, 11/09/2020   Influenza-Unspecified 10/08/2017, 10/14/2018   PFIZER(Purple Top)SARS-COV-2 Vaccination 09/03/2019, 09/27/2019, 04/18/2020   Pneumococcal Polysaccharide-23 11/02/2012   Tdap 09/18/2011, 03/11/2022  . Prostate cancer screening- no family history, start at age 45    5. Colon cancer screening - no family history, start at age 33  6. Skin cancer screening/prevention- [facility]- had some abnormal cells but no cancer.SABRA advised regular sunscreen  use. Denies worrisome, changing, or new skin lesions.  7. Testicular cancer screening- advised monthly self exams  8. STD screening- patient opts out- only active with wife 9. Smoking associated screening- never smoker  Status of chronic or acute concerns   #Family history of early MI Father age 24- alcoholic and abusing prescription drugs and sedentary  -Thankfully excellent calcium score of 0 on 10/20/2023 by Dr. Gollan -lipids are elevated but plans to work on lifestyle  # Asthma/allergies- Dr. Fleeta Smock S: Medication: albuterol  available if needed- sparing  -prior Trelegy 200-60 2.5-25 mcg 1 puff daily -Allergies on Allegra or Xyzal and Flonase . Also singulair may help both A/P: reasonable control lately even without relegy- has not seen allergist lately but doing ok - plans to schedule though   # GERD S:Medication: Pantoprazole  40 mg daily controlling B12 levels related to PPI use: will check with next labs hopefully A/P: reasonable control lately- continue to monitor - still has to watch his diet or may need a Tums  # Hyperglycemia/insulin resistance/prediabetes S:  Medication: none Lab Results  Component Value Date   HGBA1C 5.8 06/30/2023   A/P: prediabetes noted  but too soon for recheck- is going to work on healthy eating and regular exercise   Recommended follow up: No follow-ups on file. Future Appointments  Date Time Provider Department Center  01/28/2024  8:30 AM [facility]   Lab/Order associations:already did labs No diagnosis found.  No orders of the defined types were placed in this encounter.   Return precautions advised.   Garnette Lukes, MD

## 2023-12-25 NOTE — Progress Notes (Unsigned)
" °   Ben Jackson D.[facility] [address] [place] [address] Phone: [phone]   Assessment and Plan:     *** - Patient has received relief with OMT in the past.  Elects for repeat OMT today.  Tolerated well per note below. - Decision today to treat with OMT was based on Physical Exam   After verbal consent patient was treated with HVLA (high velocity low amplitude), ME (muscle energy), FPR (flex positional release), ST (soft tissue), PC/PD (Pelvic Compression/ Pelvic Decompression) techniques in cervical, rib, thoracic, lumbar, and pelvic areas. Patient tolerated the procedure well with improvement in symptoms.  Patient educated on potential side effects of soreness and recommended to rest, hydrate, and use Tylenol as needed for pain control.   Pertinent previous records reviewed include ***    Follow Up: ***     Subjective:   I, Parish Augustine, am serving as a neurosurgeon for Doctor Morene Mace  Chief Complaint: back and neck   HPI:  06/10/2023  Ramello Cordial is a 43 y.o. male coming in with complaint of back and neck pain. OMT 05/11/2023. Patient states doing well. Ankle pain is coming and going, but getting better. No new symptoms or concerns.   12/28/2023 Patient states   Relevant Historical Information: ***  Additional pertinent review of systems negative.  Current Outpatient Medications  Medication Sig Dispense Refill   albuterol  (VENTOLIN  HFA) 108 (90 Base) MCG/ACT inhaler TAKE 2 PUFFS BY MOUTH EVERY 6 HOURS AS NEEDED FOR WHEEZE OR SHORTNESS OF BREATH 8.5 each 4   Ascorbic Acid (VITAMIN C) 1000 MG tablet Take 1,000 mg by mouth daily.     clarithromycin (BIAXIN) 500 MG tablet 1 tablet Orally every 12 hrs; Duration: 10 days     EPINEPHrine  0.3 mg/0.3 mL IJ SOAJ injection as directed.     fexofenadine (ALLEGRA) 180 MG tablet Take 180 mg by mouth daily.     fluticasone  (FLONASE) 50 MCG/ACT nasal spray Place 2 sprays into the nose daily.      levocetirizine (XYZAL) 5 MG tablet 1 tablet in the evening Orally Once a day; Duration: 30 days     montelukast (SINGULAIR) 10 MG tablet Take 10 mg by mouth daily.     Multiple Vitamin (MULTI-VITAMIN) tablet Take 1 tablet by mouth daily.     ondansetron  (ZOFRAN -ODT) 4 MG disintegrating tablet Take 1 tablet (4 mg total) by mouth every 8 (eight) hours as needed for nausea or vomiting. 20 tablet 0   pantoprazole  (PROTONIX ) 40 MG tablet TAKE 1 TABLET BY MOUTH EVERY DAY 90 tablet 3   scopolamine  (TRANSDERM-SCOP) 1 MG/3DAYS Place 1 patch (1.5 mg total) onto the skin every 3 (three) days. For travel 10 patch 1   No current facility-administered medications for this visit.      Objective:     There were no vitals filed for this visit.    There is no height or weight on file to calculate BMI.    Physical Exam:     General: Well-appearing, cooperative, sitting comfortably in no acute distress.   OMT Physical Exam:  ASIS Compression Test: Positive Right Cervical: TTP paraspinal, *** Rib: Bilateral elevated first rib with TTP Thoracic: TTP paraspinal,*** Lumbar: TTP paraspinal,*** Pelvis: Right anterior innominate  Electronically signed by:  Odis Mace D.[facility] 7:26 AM 12/25/2023 "

## 2023-12-28 ENCOUNTER — Ambulatory Visit: Admitting: Sports Medicine

## 2023-12-28 VITALS — HR 82 | Ht 73.0 in | Wt 238.0 lb

## 2023-12-28 DIAGNOSIS — G8929 Other chronic pain: Secondary | ICD-10-CM | POA: Diagnosis not present

## 2023-12-28 DIAGNOSIS — M9903 Segmental and somatic dysfunction of lumbar region: Secondary | ICD-10-CM | POA: Diagnosis not present

## 2023-12-28 DIAGNOSIS — M533 Sacrococcygeal disorders, not elsewhere classified: Secondary | ICD-10-CM | POA: Diagnosis not present

## 2023-12-28 DIAGNOSIS — M9904 Segmental and somatic dysfunction of sacral region: Secondary | ICD-10-CM | POA: Diagnosis not present

## 2023-12-28 DIAGNOSIS — M9905 Segmental and somatic dysfunction of pelvic region: Secondary | ICD-10-CM | POA: Diagnosis not present

## 2024-01-28 ENCOUNTER — Other Ambulatory Visit

## 2024-02-04 ENCOUNTER — Encounter: Admitting: Family Medicine

## 2024-02-04 NOTE — Progress Notes (Unsigned)
 " [facility] [address] [place] [address] Phone: [phone] Subjective:   ISusannah Gully, am serving as a scribe for Dr. Arthea Claudene.  I'm seeing this patient by the request  of:  Katrinka Garnette KIDD, MD  CC: Back and neck pain follow-up  YEP:Dlagzrupcz  Salil Raineri is a 44 y.o. male coming in with complaint of back and neck pain. OMT 12/28/2023. Patient states doing okay. No new symptoms. Same per usual, back discomfort.  Medications patient has been prescribed: None  Taking:         Reviewed prior external information including notes and imaging from previsou exam, outside providers and external EMR if available.   As well as notes that were available from care everywhere and other healthcare systems.  Past medical history, social, surgical and family history all reviewed in electronic medical record.  No pertanent information unless stated regarding to the chief complaint.   Past Medical History:  Diagnosis Date   ALLERGIC RHINITIS 09/17/2009   ASTHMA 09/17/2009   HYPERLIPIDEMIA 09/17/2009    Allergies[1]   Review of Systems:  No headache, visual changes, nausea, vomiting, diarrhea, constipation, dizziness, abdominal pain, skin rash, fevers, chills, night sweats, weight loss, swollen lymph nodes, body aches, joint swelling, chest pain, shortness of breath, mood changes. POSITIVE muscle aches  Objective  Blood pressure 124/88, pulse 90, height 6' 1 (1.854 m), weight 248 lb (112.5 kg), SpO2 97%.   General: No apparent distress alert and oriented x3 mood and affect normal, dressed appropriately.  HEENT: Pupils equal, extraocular movements intact  Respiratory: Patient's speak in full sentences and does not appear short of breath  Cardiovascular: No lower extremity edema, non tender, no erythema  Gait MSK:  Back he does have some mild loss of lordosis.  Has tightness noted in the FABER test bilaterally.  Patient though able  to get up from a sitting position.  Some pain in the thoracolumbar junction noted as well.  Osteopathic findings  C2 flexed rotated and side bent right C7 flexed rotated and side bent left T3 extended rotated and side bent right inhaled rib T7 extended rotated and side bent left L2 flexed rotated and side bent right Sacrum right on right       Assessment and Plan:  SI (sacroiliac) joint dysfunction Chronic problem with some tightness.  Discussed icing regimen and home exercises, discussed which activities to do and which ones to avoid.  Patient has been doing very well.  No change in medications at this point.  Patient will continue to work on core strengthening.  Patient has gained some weight over the holidays but is hopeful to lose the weight again in the near future.  Discussed icing regimen and home exercises.  Follow-up again in 6-8 weeks    Nonallopathic problems  Decision today to treat with OMT was based on Physical Exam  After verbal consent patient was treated with HVLA, ME, FPR techniques in cervical, rib, thoracic, lumbar, and sacral  areas  Patient tolerated the procedure well with improvement in symptoms  Patient given exercises, stretches and lifestyle modifications  See medications in patient instructions if given  Patient will follow up in 4-8 weeks    The above documentation has been reviewed and is accurate and complete Asal Teas M Mckinzy Fuller, DO          Note: This dictation was prepared with Dragon dictation along with smaller phrase technology. Any transcriptional errors that result from this process  are unintentional.            [1]  Allergies Allergen Reactions   Augmentin  [Amoxicillin -Pot Clavulanate]     Stomach cramping/diarrhea   Doxycycline      Stomach cramping/diarrhea   Doxycycline  Hyclate Diarrhea   Prednisolone Other (See Comments)   Prednisone  Other (See Comments)    Other reaction(s): Other (See Comments),  Unknown  hiccups  Other Reaction(s): Other (See Comments)   "

## 2024-02-10 ENCOUNTER — Ambulatory Visit: Admitting: Family Medicine

## 2024-02-10 ENCOUNTER — Encounter: Payer: Self-pay | Admitting: Family Medicine

## 2024-02-10 VITALS — BP 124/88 | HR 90 | Ht 73.0 in | Wt 248.0 lb

## 2024-02-10 DIAGNOSIS — M9902 Segmental and somatic dysfunction of thoracic region: Secondary | ICD-10-CM

## 2024-02-10 DIAGNOSIS — M9903 Segmental and somatic dysfunction of lumbar region: Secondary | ICD-10-CM | POA: Diagnosis not present

## 2024-02-10 DIAGNOSIS — M533 Sacrococcygeal disorders, not elsewhere classified: Secondary | ICD-10-CM

## 2024-02-10 DIAGNOSIS — M9908 Segmental and somatic dysfunction of rib cage: Secondary | ICD-10-CM | POA: Diagnosis not present

## 2024-02-10 DIAGNOSIS — M9904 Segmental and somatic dysfunction of sacral region: Secondary | ICD-10-CM

## 2024-02-10 DIAGNOSIS — M9901 Segmental and somatic dysfunction of cervical region: Secondary | ICD-10-CM

## 2024-02-10 NOTE — Assessment & Plan Note (Addendum)
 Chronic problem with some tightness.  Discussed icing regimen and home exercises, discussed which activities to do and which ones to avoid.  Patient has been doing very well.  No change in medications at this point.  Patient will continue to work on core strengthening.  Patient has gained some weight over the holidays but is hopeful to lose the weight again in the near future.  Discussed icing regimen and home exercises.  Follow-up again in 6-8 weeks

## 2024-02-10 NOTE — Patient Instructions (Signed)
 Good to see you! Keep it up See you again in 6 weeks

## 2024-03-24 ENCOUNTER — Ambulatory Visit: Admitting: Family Medicine

## 2024-04-12 ENCOUNTER — Encounter: Admitting: Family Medicine

## 2024-11-25 ENCOUNTER — Encounter: Admitting: Family Medicine
